# Patient Record
Sex: Male | Born: 1940
Health system: Southern US, Community
[De-identification: ages and names within clinical notes are randomized; demographics above are authoritative.]

## PROBLEM LIST (undated history)

## (undated) DIAGNOSIS — K2270 Barrett's esophagus without dysplasia: Secondary | ICD-10-CM

## (undated) DIAGNOSIS — Z87442 Personal history of urinary calculi: Secondary | ICD-10-CM

## (undated) DIAGNOSIS — J189 Pneumonia, unspecified organism: Secondary | ICD-10-CM

## (undated) DIAGNOSIS — E785 Hyperlipidemia, unspecified: Secondary | ICD-10-CM

## (undated) DIAGNOSIS — C801 Malignant (primary) neoplasm, unspecified: Secondary | ICD-10-CM

## (undated) DIAGNOSIS — M1990 Unspecified osteoarthritis, unspecified site: Secondary | ICD-10-CM

## (undated) DIAGNOSIS — I10 Essential (primary) hypertension: Secondary | ICD-10-CM

## (undated) DIAGNOSIS — I499 Cardiac arrhythmia, unspecified: Secondary | ICD-10-CM

## (undated) DIAGNOSIS — C61 Malignant neoplasm of prostate: Secondary | ICD-10-CM

## (undated) DIAGNOSIS — K219 Gastro-esophageal reflux disease without esophagitis: Secondary | ICD-10-CM

## (undated) HISTORY — DX: Hyperlipidemia, unspecified: E78.5

## (undated) HISTORY — PX: KNEE ARTHROSCOPY: SUR90

## (undated) HISTORY — PX: CYSTOSCOPY: SUR368

## (undated) HISTORY — PX: EYE SURGERY: SHX253

---

## 2002-12-03 DIAGNOSIS — C801 Malignant (primary) neoplasm, unspecified: Secondary | ICD-10-CM

## 2002-12-03 HISTORY — PX: PROSTATECTOMY: SHX69

## 2002-12-03 HISTORY — DX: Malignant (primary) neoplasm, unspecified: C80.1

## 2005-01-25 ENCOUNTER — Ambulatory Visit (HOSPITAL_BASED_OUTPATIENT_CLINIC_OR_DEPARTMENT_OTHER): Admission: RE | Admit: 2005-01-25 | Discharge: 2005-01-25 | Payer: Self-pay | Admitting: Urology

## 2005-01-25 ENCOUNTER — Ambulatory Visit (HOSPITAL_COMMUNITY): Admission: RE | Admit: 2005-01-25 | Discharge: 2005-01-25 | Payer: Self-pay | Admitting: Urology

## 2005-01-29 ENCOUNTER — Ambulatory Visit (HOSPITAL_COMMUNITY): Admission: RE | Admit: 2005-01-29 | Discharge: 2005-01-29 | Payer: Self-pay | Admitting: Urology

## 2005-02-12 ENCOUNTER — Inpatient Hospital Stay (HOSPITAL_COMMUNITY): Admission: EM | Admit: 2005-02-12 | Discharge: 2005-02-15 | Payer: Self-pay | Admitting: Emergency Medicine

## 2005-02-22 ENCOUNTER — Ambulatory Visit (HOSPITAL_COMMUNITY): Admission: RE | Admit: 2005-02-22 | Discharge: 2005-02-22 | Payer: Self-pay | Admitting: Urology

## 2005-02-22 ENCOUNTER — Ambulatory Visit (HOSPITAL_BASED_OUTPATIENT_CLINIC_OR_DEPARTMENT_OTHER): Admission: RE | Admit: 2005-02-22 | Discharge: 2005-02-22 | Payer: Self-pay | Admitting: Urology

## 2005-03-15 ENCOUNTER — Ambulatory Visit (HOSPITAL_COMMUNITY): Admission: RE | Admit: 2005-03-15 | Discharge: 2005-03-15 | Payer: Self-pay | Admitting: Urology

## 2005-04-16 ENCOUNTER — Ambulatory Visit (HOSPITAL_COMMUNITY): Admission: AD | Admit: 2005-04-16 | Discharge: 2005-04-16 | Payer: Self-pay | Admitting: Urology

## 2005-05-08 ENCOUNTER — Ambulatory Visit (HOSPITAL_COMMUNITY): Admission: RE | Admit: 2005-05-08 | Discharge: 2005-05-08 | Payer: Self-pay | Admitting: Urology

## 2005-05-08 ENCOUNTER — Ambulatory Visit (HOSPITAL_BASED_OUTPATIENT_CLINIC_OR_DEPARTMENT_OTHER): Admission: RE | Admit: 2005-05-08 | Discharge: 2005-05-08 | Payer: Self-pay | Admitting: Urology

## 2005-07-11 ENCOUNTER — Ambulatory Visit (HOSPITAL_COMMUNITY): Admission: RE | Admit: 2005-07-11 | Discharge: 2005-07-11 | Payer: Self-pay | Admitting: Urology

## 2006-11-23 ENCOUNTER — Inpatient Hospital Stay (HOSPITAL_COMMUNITY): Admission: EM | Admit: 2006-11-23 | Discharge: 2006-11-24 | Payer: Self-pay | Admitting: Emergency Medicine

## 2007-04-16 ENCOUNTER — Ambulatory Visit (HOSPITAL_COMMUNITY): Admission: RE | Admit: 2007-04-16 | Discharge: 2007-04-16 | Payer: Self-pay | Admitting: Urology

## 2007-11-12 ENCOUNTER — Encounter: Admission: RE | Admit: 2007-11-12 | Discharge: 2007-11-12 | Payer: Self-pay | Admitting: Internal Medicine

## 2009-06-13 ENCOUNTER — Ambulatory Visit (HOSPITAL_COMMUNITY): Admission: RE | Admit: 2009-06-13 | Discharge: 2009-06-13 | Payer: Self-pay | Admitting: Urology

## 2010-09-04 ENCOUNTER — Ambulatory Visit: Payer: Self-pay | Admitting: Cardiology

## 2011-04-20 NOTE — Discharge Summary (Signed)
[name], MCNATT NO.:  [id number]   MEDICAL RECORD NO.:  [id number]          PATIENT TYPE:  INP   LOCATION:  5511                         FACILITY:  [facility]   PHYSICIAN:  Aram Beecham B. Eliott Nine, M.D.DATE OF BIRTH:  09-25-1941   DATE OF ADMISSION:  02/12/2005  DATE OF DISCHARGE:  02/15/2005                                 DISCHARGE SUMMARY   ADMISSION DIAGNOSES:  1.  Acute renal failure with hyperkalemia.  2.  History of obstructing right ureteral stone status post lithotripsy and      laser tripsy with poorly functioning right kidney and a kidney stone in      the left renal pelvis in the lower pole.  3.  History of prostatic cancer, status post prostatectomy and radiation      therapy in 2003.  4.  Hypertension.   DISCHARGE DIAGNOSES:  1.  Acute renal failure secondary to obstructive uropathy, resolving status      post percutaneous nephrostomy on the left.  2.  Hypertension.  3.  Kidney stones with obstructing right ureteral stone, left stones in the      renal pelvis and lower pole.  4.  History of prostatic carcinoma status post prostatectomy and radiation      therapy in 2003.  5.  History of urinary incontinence.   PROCEDURES:  1.  Renal ultrasound on February 12, 2005 showing bilateral renal cyst with      double-J catheters in place, element of hydronephrosis bilaterally.  2.  On February 12, 2005, successful left percutaneous nephrostomy catheter      insertion by Dr. Jolaine Click of radiology.   BRIEF HISTORY/REASON FOR ADMISSION:  [name] is a 70 year old white male  with a history of prostatic cancer status post prostatectomy and radiation  therapy in 2003 with also a known history of stone disease evaluated on  January 12, 2005 by Dr. Bjorn Pippin because of erectile dysfunction and  incontinence.  As part of his evaluation, he had a CT scan of his abdomen  and pelvis showing obstructive distal right ureteral stone and stones in the  left renal  pelvis and lower pole.  There was by report very little  functioning and a severely hydronephrotic right kidney.  Creatinine at that  time was 1.8.  On January 25, 2005, he underwent lithotripsy on the left,  and right distal ureteroscopy with laser tripsy and double-J stents were  placed.  He was seen in followup on February 05, 2005.  A KUB at that time  showed the majority of the stone material in the left distal ureter  alongside the stent with three clusters of fragments in the lower pole.   The patient began to feel poorly about one week ago to 10 days ago.  He  developed excessive sleepiness and intermittent bouts of nausea.  This was  associated with vomiting and anorexia and came to Dr. Belva Crome office for  evaluation.  His labs were done on 02/12/05, showing a BUN of 150, creatinine  of 21.5, and a potassium of 8.5.  He was  sent to the emergency room for  further evaluation and [facility] was consulted.  The patient says he  has not been oliguric or having pain.  He has had decreased urine volume,  however.  He does report fatigue, nausea, anorexia, and poor p.o. intake.  His medications were limited to Diovan which he did not know the dose,  briefly switched from lisinopril to Diovan by Dr. Georgann Housekeeper who is his  primary care physician.  There is nobody in his family with a history of  renal disease or stone disease.   PHYSICAL EXAMINATION:  GENERAL:  On physical exam by Dr. Eliott Nine in the  emergency room, general appearance is a pleasant, white male in no acute  distress.  He was accompanied in the ER by his wife.  VITAL SIGNS:  Noted blood pressure 170/82, heart rate 86 and regular,  respirations 18, O2 saturation 97% on room air.  NECK:  There is no JVD, distention in spine position, no carotid bruits are  auscultated.  LUNGS:  Clear to auscultation.  CARDIAC EXAM:  S4, S1, S2, no audible S3.  ABDOMINAL EXAM:  Revealed bowel sounds positive, no abdominal bruits   appreciated.  He had some left-sided abdominal tenderness to deep palpation.  The bladder was not palpable or percussible.  He had a healed prostatectomy  scar noted.  EXTREMITIES:  Revealed no edema and no asterixis.   LABORATORY DATA:  Laboratory data from Dr. Belva Crome office showed a glucose  of 189, CO2 13, calcium 9.1.  His other pertinent laboratories showed CBC  with a white count of 8.9, hemoglobin 12.4, platelets 568,000.  Phosphorus  here was 5.1, with an albumin of 3.4.   The patient was admitted by Dr. Eliott Nine with renal ultrasound obtained.  EKG  was done.  With his hyperkalemia, the patient was given sodium bicarbonate  x2 amps, calcium gluconate x1, 5 units of regular insulin, and D50, and 50 g  of Kayexalate.  The patient had a baseline creatinine of 1.7.  Now that he  was obstructed, it was suspected that his good left kidney was obstructed  with a history of poor right-sided function at baseline.  Repeat labs were  ordered to make sure his potassium stabilized before emergent ultrasound and  percutaneous nephrostomy on the left could be done if in fact he was  obstructed.  If his potassium did not respond, he would require acute  hemodialysis.  Blood pressure medicine was held and p.r.n. clonidine was to  be used.   HOSPITAL COURSE:  The patient's hyperkalemia did improve with this medical  management.  Potassium rechecked in the ER was 7.4 and went down to 5.1, and  he was moved over to the radiology department.  At that point, an ultrasound  was done showing bilateral renal cysts with double-J catheters in place and  an element of hydronephrosis bilaterally.  Because of known poor function on  his right and the hydronephrosis on the left, the left kidney was thought to  be a source of his acute renal failure with obstruction.  Dr. Jolaine Click  performed successful left percutaneous nephrostomy catheter placement with a 10 [place] on February 12, 2005.  The patient had  excellent urine output after  catheter was placed with the patient's BUN and creatinine improving after  catheter placement to a BUN of 127 and creatinine down to 15.9 from his 27.0  creatinine in the ER.  Potassium improved to 5.4.  Labs were checked  daily.  He symptomatically improved with the nausea and vomiting resolving.  BUN was  down to 57, creatinine 5.1 on February 14, 2005.  IV fluids which were started  on admission were stopped.  He took p.o. fluids without difficulty.  His  creatinine was down to 3.2 on February 15, 2005.  He was up ambulating in the  halls without difficulty.  He was deemed ready for discharge home by Dr.  Eliott Nine with further followup with Dr. Annabell Howells.   It should be noted that Dr. Annabell Howells followed the patient also in the hospital.  He planned to follow up the patient in the office on February 21, 2005 and at  that time would have a KUB and a ureteroscopy on February 22, 2005.   The patient's blood pressure was managed in the hospital with Norvasc 10 mg  p.o. daily, blood pressure 169/93  at the time of discharge.  We will have  further followup as an outpatient.  He was on Norvasc 10 mg daily.  At this  time, no ACE's or ARB should be given to this patient who had acute renal  failure from obstruction and now kidney function status post percutaneous  catheter.   The patient was discharged on February 15, 2005 with followup with Dr. Annabell Howells on  February 21, 2005.  Follow up with Dr. Camille Bal in her office in three to  four weeks.  He will have a renal profile drawn in Dr. Belva Crome office on  February 21, 2005 with results faxed to Dr. Elza Rafter office.  This is  instructed to Dr. Belva Crome office today.  Also, the patient will follow up  with Dr. Donette Larry, his primary doctor as an outpatient.   MEDICATIONS ON DISCHARGE:  Norvasc 10 mg daily.   DIET:  No added sodium.   ACTIVITY:  As tolerated.   The patient was given instructions on how to manage his left nephrostomy   catheter.  He will record volumes of urine daily and bring the log to Dr.  Elza Rafter and Dr. Belva Crome office.      DWZ/MEDQ  D:  02/15/2005  T:  02/15/2005  Job:  [id number]   cc:   Excell Seltzer. Annabell Howells, M.D.  [address]  [place]  [place] [address]  Fax: [phone]   Georgann Housekeeper, MD  [address]  [place]  [place] [address]  Fax: [phone]

## 2011-04-20 NOTE — Op Note (Signed)
NAMEJARIEL, Matthew [name] NO.:  [id number]   MEDICAL RECORD NO.:  [id number]          PATIENT TYPE:  AMB   LOCATION:  DAY                          FACILITY:  [facility]   PHYSICIAN:  Excell Seltzer. Annabell Howells, M.D.    DATE OF BIRTH:  Jan 21, 1941   DATE OF PROCEDURE:  04/16/2005  DATE OF DISCHARGE:  04/16/2005                                 OPERATIVE REPORT   PROCEDURE:  Cystoscopy, bilateral retrograde pyelography, bilateral  ureteroscopy, and insertion of bilateral double-J stents.   PREOPERATIVE DIAGNOSIS:  History of bilateral nephrolithiasis with ureteral  obstruction and renal insufficiency.   POSTOPERATIVE DIAGNOSIS:  History of bilateral nephrolithiasis with ureteral  obstruction and renal insufficiency.   SURGEON:  Dr. Bjorn Pippin.   ANESTHESIA:  General.   DRAINS:  Bilateral 8 French x 26 cm double-J stents.   COMPLICATIONS:  None.   INDICATIONS:  Matthew [name] is a 70 year old white male with a history of  bilateral nephrolithiasis who has previously been treated with multiple  procedures.  He first underwent a right ureteroscopic stone extraction for a  large obstructing stone.  He then underwent placement of bilateral stents,  and this was followed by lithotripsy on the left for multiple left renal  calculi.  He developed a left steinstrasse with obstructing stent and renal  insufficiency with a creatinine of 25.  He underwent a percutaneous  nephrostomy, then had ureteroscopy with removal of the steinstrasse followed  by stent replacement.  His creatinine came back down to 1.7.  He then  underwent a second lithotripsy for residual fragments in the kidney and  eventually had his stents removed.  He did well but return on the 15th with  general malaise and feeling poorly.  He denied flank pain.  A repeat  creatinine was elevated at 6.8 with a potassium of 6.  Ultrasonography  revealed severe hydro on the right and mild-to-moderate hydro on the left.  It was felt that  urgent stenting was indicated.   FINDINGS/PROCEDURE:  Patient was taken to the operating room where general  anesthetic was induced.  He had been given an antibiotic preoperatively.  He  was placed in a lithotomy position.  His perineum and genitalia were prepped  with Betadine solution.  He was draped in the usual sterile fashion.  Cystoscopy was performed in the usual fashion with a 22 [place] scope and the  12 degree lens.  Examination revealed a normal urethra.  The external  sphincter was intact.  The prostate was absent from prior radical  prostatectomy.  Examination of the bladder revealed some inflammatory  changes in the base of the bladder from his prior procedures.  The left  ureteral orifice was cannulated with a 5 [place] open-ended catheter, and  contrast was instilled in a retrograde fashion.  This revealed dilation of  the distal ureter with about a 3 cm area of narrowing, followed by dilation  of the more mid-to-proximal ureter.  There was a questionable filling defect  in the area of the UPJ, suggestive for residual stone fragments.   A right  retrograde pyelogram was then performed in a similar fashion.  This  revealed marked hydronephrosis proximal to the distal ureter.  Unfortunately, due to patient positioning, I was unable to see the very most  distal ureters on either side.   After completion of the retrograde pyelogram, I was able to negotiate a  guidewire up to the left kidney; however, it required use of a 6 French  short ureteroscope to bypass the distal ureter.  Once the wire was up to the  kidney, an 8 French x 26 cm double-J stent without string was inserted under  fluoroscopic guidance.  Upon removal of the wire, a good coil was noted in  the kidney and in the bladder.   I then turned my attention to the right ureter.  An attempt to pass the wire  was unsuccessful due to angulation and narrowing.  The 6 French short  ureteroscope was then once again used to  cannulate the orifice.  Some stone  fragments were identified in the distal ureter, but the lumen was eventually  located, and a guidewire was passed through the kidney.  The cystoscope was  reinserted over the wire, and an 8 French x 26 cm double-J stent was then  inserted into the kidney under fluoroscopic guidance.  The wire was removed,  leaving a good coil in the kidney and a good coil in the bladder.  At this  point, the patient's bladder was drained.  He was taken down from the  lithotomy position.  His anesthetic was reversed.  He was removed to the  recovery room in stable condition.  There were no complications during the  procedure.  We will leave these stents in for approximately two weeks and  then repeat ureteroscopy.      JJW/MEDQ  D:  04/20/2005  T:  04/20/2005  Job:  [id number]

## 2011-04-20 NOTE — Op Note (Signed)
NAMEGEO, SLONE NO.:  [id number]   MEDICAL RECORD NO.:  [id number]          PATIENT TYPE:  AMB   LOCATION:  NESC                         FACILITY:  [facility]   PHYSICIAN:  Excell Seltzer. Annabell Howells, M.D.    DATE OF BIRTH:  Apr 14, 1941   DATE OF PROCEDURE:  02/22/2005  DATE OF DISCHARGE:                                 OPERATIVE REPORT   PROCEDURE:  1.  Cystoscopy, removal of right double J stent.  2.  Removal of left double J stent with left ureteroscopic stone extraction      with holmium lasertripsy.  3.  Placement of left double J stent.   PREOPERATIVE DIAGNOSES:  Retained bilateral ureteral stents and left  ureteral Steinstrasse with obstructed ureteral stent.   POSTOPERATIVE DIAGNOSES:  Retained bilateral ureteral stents and left  ureteral Steinstrasse with obstructed ureteral stent.   SURGEON:  Cassius Cullinane. Annabell Howells, M.D.   ANESTHESIA:  General.   SPECIMENS:  Stone fragments.   DRAINS:  8 French 26 cm left double J stent.   COMPLICATIONS:  None.   INDICATIONS FOR PROCEDURE:  Matthew [name] is a 70 year old white male who was  found to have an obstructing right distal ureteral stone with atrophy of the  kidney and three stones in the left kidney. He originally underwent right  ureteroscopic stone extraction with placement of a bilateral double J stents  followed by lithotripsy. He developed obstruction of the left ureteral stent  and a distal ureteral Steinstrasse with acute renal failure and was found to  have minimal function of his right kidney from chronic obstruction. He  underwent percutaneous nephrostomy tube placement with near normalization of  his creatinine and BUN. He returns today for removal of the right stent,  removal of the left stent with ureteroscopic removal of the Steinstrasse and  replacement of left stent.   DESCRIPTION OF PROCEDURE:  The patient was given Cipro. He was taken to the  operating room where a general anesthetic was induced. He was  placed in  lithotomy position, his perineum and genitalia were prepped with Betadine  solution, he was draped in the usual sterile fashion. Cystoscopy was  performed using the 22 [place] scope and the 12 degree lens. The right  ureteral stent was visualized and grasped with alligator forceps and  removed. The left ureteral stent was then grasped with alligator forceps and  removed to the urethral meatus. An attempt was made to pass a wire through  the stent __________ and this was not possible. I then removed the stent and  placed the cystoscope in the bladder. An attempt was made to advance the  wire up the ureter.  Initially it was unsuccessful but eventually I was able  to negotiate the wire by the obstructing Steinstrasse to the kidney. At this  point, a 4 cm, 15 French balloon dilation catheter was passed over the wire  and the distal ureter was dilated. The 6 French short ureteroscope was then  passed along side the wire and the stones in the distal ureter were engaged  with the holmium  laser with the 0.375 fiber initially on 5 watts but  eventually on 8 watts. The stone fragments were gradually loosened and  removed in intervals with a nitinol stone basket. The Steinstrasse was quite  extensive extending probably 4 cm up the ureter. I was able to remove all  the large fragments that were present. There was some dust remaining. After  removal of all the large fragments, the dust would not engage in the basket.  The procedure was quite prolonged and arduous due to the large number of  fragments and there was some ureteral irritation but no rents in the  ureteral wall after completion. Once all the removable stone material was  placed in the bladder. The cystoscope was reinserted and the bladder was  evacuated with a To[place]y syringe. The cystoscope was then inserted back over  the wire and an 8 [place] 26 cm double J stent was inserted into the kidney  under fluoroscopic guidance. The left  percutaneous nephrostomy tube was then  removed without difficulty. The stent did appear to extend out into the  nephrostomy track so it was pulled back until it was located in the renal  pelvis. At this point, final inspection revealed the stent to be well  located in the bladder. The patient was taken down from the lithotomy  position, his anesthetic was reversed, he was removed to the recovery room  in stable condition. There were no complications.      JJW/MEDQ  D:  02/22/2005  T:  02/22/2005  Job:  [id number]   cc:   Duke Salvia. Eliott Nine, M.D.  [address]  [place]  [place] [address]  Fax: [phone]

## 2011-04-20 NOTE — H&P (Signed)
NAMEJAEVION, GOTO NO.:  [id number]   MEDICAL RECORD NO.:  [id number]          PATIENT TYPE:  EMS   LOCATION:  MAJO                         FACILITY:  [facility]   PHYSICIAN:  Aram Beecham B. Eliott Nine, M.D.DATE OF BIRTH:  November 13, 1941   DATE OF ADMISSION:  02/12/2005  DATE OF DISCHARGE:                                HISTORY & PHYSICAL   HISTORY OF PRESENT ILLNESS:  This is a 70 year old white male with a history  of prostate cancer, status post prostatectomy and adjuvant radiation therapy  in 2003.  He also has a history of stone disease.  He was evaluated on  January 12, 2005, by Dr. Excell Seltzer. Annabell Howells because of erectile dysfunction and  incontinence.  As part of his evaluation he had a CT of the abdomen and  pelvis which showed an obstructing distal right ureteral stone, and stones  in the left renal pelvis and lower pole.  There was by report very little  functioning in the severely hydronephrotic right kidney.  Creatinine at that  time was 1.8.  On January 25, 2005, he underwent a lithotripsy on the left,  and a right distal ureteroscopy with laser tripsy and double-J stents were  placed.  He was seen in followup on February 05, 2005.  A KUB at that time  showed the majority of the stone material in the left distal ureter, along  side the stent with three clusters of fragments in the lower pole.   The patient began to feel poorly about one week to 10 days ago.  He  developed excessive sleepiness, lost his appetite, and had intermittent  episodes of nausea, associated with vomiting and anorexia.  He came to Dr.  Belva Crome office today where labs were done, demonstrating a BUN of 150,  creatinine 21.5 and a potassium of 8.5.  He was sent to the emergency room  for further evaluation.  He has not been oliguric or having pain, but has  had decreased urine volume.  He also reports fatigue, anorexia, nausea and  poor p.o. intake.   PAST MEDICAL HISTORY:  1.  Prostate cancer.  2.   Uric acid stones as above.  3.  Erectile dysfunction.  4.  History of urinary incontinence.  5.  Hypertension.   CURRENT MEDICATIONS:  Limited to Diovan, question dose (previously switched  from Lisinopril to Diovan by Dr. Georgann Housekeeper, his primary care physician.)   FAMILY HISTORY:  Noncontributory, with no history of renal or stone disease.   SOCIAL HISTORY:  The patient works with [employer].  He  is married and has five children.  He recently relocated here from [place].  He does not use tobacco, and only occasionally uses alcohol.   REVIEW OF SYSTEMS:  Positive for fatigue, excessive sleepiness for one week,  nausea, with intermittent vomiting, anorexia, decreased urine volume and  blood-tinged urine.   PHYSICAL EXAMINATION:  GENERAL:  He is a pleasant white male, in no acute  distress.  He is accompanied in the emergency room by his wife.  VITAL SIGNS:  Blood  pressure 170/82, heart rate 86 and regular, respirations  18, oxygen saturation 97% on room air.  HEENT:  Normocephalic without evidence for trauma.  NECK:  There is no jugular venous distention in the supine position.  He has  no carotid bruits.  LUNGS:  Clear to auscultation.  HEART:  S4, S1, S2.  No audible S3.  ABDOMEN:  Reveals a well-healed prostatectomy scar.  He has some mild left-  sided abdominal tenderness to deep palpation. The bladder is not palpable or  percussible.  There are no abdominal bruits.  There is no edema of the lower  extremities.  He has no asterixis.   LABORATORY DATA:  From Dr. Belva Crome office, glucose 189, BUN 50, creatinine  21.5, potassium 8.5.  CO2 of 13, calcium 9.1.  CBC and uric acid pending.   An ultrasound is pending.   An electrocardiogram is pending.   IMPRESSION:  A 70 year old white male with stone disease, who has a baseline  creatinine of 1.7, who is status post lithotripsy and laser tripsy of an  obstructing right ureteral stone, and a  poorly-functioning right kidney, and  a stone in the left renal pelvis and lower pole.  He now presents with acute  renal failure and hyperkalemia.  I suspect his good left kidney is  obstructed, with a poor history of right-sided function at baseline.   PLAN:  Will repeat stat labs, give him sodium bicarbonate x2 amp, calcium  gluconate x1 amp, 5 units of Regular insulin and D-50 and 50 g of  Kayexalate.  Hopefully then we can stabilize his potassium and proceed with  emergent ultrasound and percutaneous nephrostomy on the left, if he is in  fact obstructed.  If his potassium does not respond to the above  conservative treatment, will require acute dialysis.  Will hold his Diovan  and use p.r.n. clonidine for hypertension.      CBD/MEDQ  D:  02/12/2005  T:  02/12/2005  Job:  [id number]   cc:   Excell Seltzer. Annabell Howells, M.D.  [address]  [place]  [place] [address]  Fax: [phone]   Georgann Housekeeper, MD  [address]  [place]  [place] [address]  Fax: [phone]   [facility]

## 2011-04-20 NOTE — Op Note (Signed)
NAMETERRION, GENCARELLI                  ACCOUNT NO.:  [id number]   MEDICAL RECORD NO.:  [id number]          PATIENT TYPE:  AMB   LOCATION:  DSC                          FACILITY:  [facility]   PHYSICIAN:  Excell Seltzer. Annabell Howells, M.D.    DATE OF BIRTH:  06-Nov-1941   DATE OF PROCEDURE:  01/25/2005  DATE OF DISCHARGE:                                 OPERATIVE REPORT   HISTORY:  Mr. Samford is a 70 year old white male who originally presented to  my office for followup of prostate cancer. He had a radical prostatectomy  with adjuvant radiation therapy in 2002. During his initial evaluation, he  was found to have microhematuria. A CT scan demonstrated an obstructing  large right distal ureteral stone and two large left renal pelvic stones. It  was felt that management of the right ureteral stone was indicated as  initial therapy, so the patient comes in today for ureteroscopy and laser-  tripsy.   FINDINGS AND PROCEDURE:  The patient was taken to the operating room after  receiving p.o. Cipro. He was given a general anesthetic, a time-out was  performed, the site had been marked previously. He was placed in the  lithotomy position. His perineum and genitalia were prepped with Betadine  solution, he was draped in the usual sterile fashion. Cystoscopy was  performed using the 22-French scope and the 12- and 70-degree lens.  Examination revealed a normal urethra, the external sphincter was intact.  The bladder neck did have mild contracture, but the scope could be passed.  The right ureteral orifice was visualized on a ridge of trigonal tissue,  somewhat posteriorly oriented. The left ureteral orifice was more normal and  a more normal orientation. There was a small tissue-band at the left bladder  neck, but no other mucosal abnormalities were noted. The right ureteral  orifice was eventually cannulated with a guidewire using an Albarran bridge  and a 70-degree scope after initial attempts with a 12-degree and  an open-  end catheter were unsuccessful. The open-end catheter was then inserted over  the wire, the wire was removed, and contrast was instilled in a retrograde  fashion. This outlined a filling defect in the distal ureter consistent with  a 14 x 9 mm stone seen on CT scan. At this point, a Glidewire was then used  to negotiate by the impacted stone in the distal ureter. The open-end  catheter was then advanced over the Glidewire. The Glidewire was removed and  was replaced with a standard Teflon-coated guidewire. The open-end catheter  was then removed and a 4 cm, 15-French balloon dilation catheter was passed  over the wire. The distal ureter was then dilated to 15-French with the  balloon dilation catheter and the cystoscope and balloon dilation catheter  were removed leaving the wire in place. The 6-French short ureteroscope was  then advanced along side the wire up to the level of the stone, which was  easily visualized. The stone was then completely fragmented with the holmium  laser device. Once fragmentation had been completed, the fragments were  removed  with a Nitinol basket; I did have to go back and re-fragment some  larger fragments that did not want to come out with the basket. However, I  was eventually able to fragment the entire stone sufficiently and all but  some very small fragments were removed from the ureter. The final inspection  with the ureteroscope revealed a few 1-2 mm fragments that were too small to  engage in the basket; no significant mucosal tears, but some mucosal  irritation. The ureteroscope was then removed and the cystoscope was then  reinserted over the guidewire. A 6-French, 26 cm double-J stent without  string was then advanced to the kidney under fluoroscopic guidance. The wire  was removed leaving a good coil in the kidney, a good coil in the bladder.  The ureteral stone fragments were then evacuated from the bladder using a  Toomey syringe and  the cystoscope. Once the fragments had been evacuated, I  elected to perform a left retrograde pyelogram to better assess the stones  in the left renal pelvis. This study revealed a normal ureter, the renal  pelvis was non-dilated but there were two large filling defects within the  renal pelvis. Each stone appeared to measure approximately 15 x 8 mm. With  this confirmation of two large stones in the renal pelvis, I elected to go  ahead and place a left double-J stent in preparation for eventual  lithotripsy. A guidewire was passed to the kidney without difficulty. A 6-  French, 24 cm stent was then passed over the guidewire to the kidney under  fluoroscopic guidance. The wire was removed leaving  a good coil in the kidney and a good coil in the bladder. At this point, the  bladder was drained, the patient was taken down from the lithotomy position,  his anesthetic was reversed, and he was moved to the recovery room in stable  condition. There were no complications.      JJW/MEDQ  D:  01/25/2005  T:  01/25/2005  Job:  [id number]

## 2011-04-20 NOTE — Op Note (Signed)
Matthew [name], Matthew [name]                  ACCOUNT NO.:  [id number]   MEDICAL RECORD NO.:  [id number]          PATIENT TYPE:  INP   LOCATION:  1444                         FACILITY:  [facility]   PHYSICIAN:  Sigmund I. Patsi Sears, M.D.DATE OF BIRTH:  Jan 19, 1941   DATE OF PROCEDURE:  11/24/2006  DATE OF DISCHARGE:                               OPERATIVE REPORT   PREOPERATIVE DIAGNOSIS:  Bilateral ureteral calculi with left  hydronephrosis.   POSTOPERATIVE DIAGNOSIS:  Bilateral ureteral calculi with left  hydronephrosis.   OPERATION:  Cystourethroscopy, bilateral retrograde pyelogram with  interpretation, bilateral ureteroscopy, basket extraction of right lower  ureteral calculus, laser fractionation of left ureteral stone with  basket extraction of fragments, and 6-French x 26 cm left double-J  catheter.   SURGEON:  Sigmund I. Patsi Sears, M.D.   ANESTHESIA:  General LMA.   PREPARATION:  After appropriate preanesthesia, the patient is brought to  the operating room, placed on the operating table in dorsal supine  position where general LMA anesthesia was induced.  He was then replaced  in dorsal lithotomy position where pubis was prepped with Betadine  solution and draped in usual fashion.   REVIEW OF HISTORY:  [name] is a 70 year old male who was  admitted from the emergency room yesterday with bilateral flank pain,  left greater than right, with evaluation showing bilateral ureteral  calculi, with a 2 mm right ureterovesical junction calculus, and a 6 mm  left distal ureteral stone, with hydronephrosis.  The patient has a  history of acute renal failure with kidney stones.  He is admitted to  the hospital on Saturday 11/23/2006, for surgery on 11/24/2006 for  basket extraction of stones.   PROCEDURE:  With the patient in the dorsal lithotomy position,  cystourethroscopy accomplished.  X-ray does not define the stones, and  therefore bilateral retrograde pyelograms performed.   However,  retrograde pyelogram did not show calculus on either side, although the  left side did appeared to show hydronephrosis, and narrowing of the  distal ureter.   Right ureteroscopy accomplished, and showed a small stone the distal  right ureter, which was basket extracted.  Because of the ease of basket  extraction, it was decided to not place double-J on the right side.   On the left side, the left distal ureter with grossly edematous, and  very tight.  It was very difficult to manipulate the 6-French short  ureteroscope into the ureter, and advanced it.  The guidewire was placed  to the level of the kidney but still no stone could be identified.   With constant pressure, I was able to manipulate the ureteroscope around  severely edematous fibrotic area of the lower ureter and found  rectangular stone complete with spikes in the lower ureter.  I elected  to break the stone with a laser fiber, because I did not feel it would  pass through the circumferential ring of edema in the distal ureter.  Following fractionation of the stone, the elements were basket  extracted, it was elected to leave a double-J catheter.  I placed a  6-  French x 26 cm double-J catheter in the renal pelvis and the bladder  without difficulty.  The patient is given 30 mg of IV Toradol, awakened,  taken to recovery room in good condition.      Sigmund I. Patsi Sears, M.D.  Electronically Signed     SIT/MEDQ  D:  11/24/2006  T:  11/25/2006  Job:  [id number]

## 2011-04-20 NOTE — Op Note (Signed)
Matthew [name], Matthew [name] NO.:  [id number]   MEDICAL RECORD NO.:  [id number]          PATIENT TYPE:  AMB   LOCATION:  NESC                         FACILITY:  [facility]   PHYSICIAN:  Excell Seltzer. Annabell Howells, M.D.    DATE OF BIRTH:  07-Jan-1941   DATE OF PROCEDURE:  05/08/2005  DATE OF DISCHARGE:                                 OPERATIVE REPORT   PROCEDURE:  Cystoscopy, bilateral retrograde pyelograms with interpretation,  bilateral ureteroscopic stone extraction and bilateral removal and  replacement of double J stents.   PREOPERATIVE DIAGNOSIS:  History of bilateral ureteral stones with  obstruction with residual fragments.   POSTOPERATIVE DIAGNOSIS:  History of bilateral ureteral stones with  obstruction with residual fragments with possible bilateral ureteral  strictures.   SURGEON:  Gevon Markus. Annabell Howells, M.D.   ANESTHESIA:  General anesthesia.   SPECIMENS:  Stone fragments.   DRAINS:  Bilateral 8.2 x 26 French double J stents.   COMPLICATIONS:  None.   INDICATIONS FOR PROCEDURE:  Matthew [name] is a 70 year old white male who  originally presented to the practice for follow-up of prostate cancer.  He  had a radical prostatectomy for PSA of 28 with a Gleason's 8 adenocarcinoma  of the prostate.  He underwent adjuvant radiation therapy following the  procedure.  He had had some hematuria and was in the process of being  evaluated when he moved to this area. On my follow-up evaluation, he was  found to have a large right distal ureteral stone with significant  obstruction and renal atrophy.  He also had left renal pelvic stones.  He  has undergone bilateral stenting with right ureteroscopy and right  lithotripsy initially.  He developed stone obstruction on the left secondary  to a Steinstrasse and had to undergo left ureteroscopic stone extraction to  break up the Steinstrasse.  His stents were removed after this but he  developed recurrent renal insufficiency due to bilateral  recurrent  obstruction and more recently underwent replacement of bilateral ureteral  stents.  He returns now for reinspection of the ureters.   FINDINGS:  The patient was taken to the operating room after receiving  preoperative antibiotics with Ancef. He was placed on the table in the  supine position.  A general anesthetic was induced.  He was then  repositioned to lithotomy.  A B&O suppository was placed.  He was prepped  with Betadine solution and draped in the usual sterile fashion.  Cystoscopy  was performed using the 22 [place] scope and the 12 degree lens.  Examination  revealed a normal urethra.  The external sphincter was intact.  The prostate  was absent but there was no evidence of bladder neck contracture from his  prostatectomy.  There was significant stent irritation in the trigone.  The  right ureteral stent was grasped with a grasping forceps and pulled back to  the urethral meatus.  A wire was passed through the stent up to the kidney  and the stent was removed.  At this point, the 6 French short ureteroscope  was passed along side  the wire.  Several stone fragments were noted in the  distal ureter.  These were removed with the Nitinol basket.  I then advanced  the ureteroscope to the level of the iliac vessels but could not advance it  any further.  No additional stone fragments were noted.  At this point, the  ureteroscope was removed and a 5 [place] open end catheter was placed over  the wire.  The wire was removed.   Retrograde pyelogram was performed on the right with installation of  contrast.  This revealed a markedly dilated right collecting system with an  unremarkable ureter down to the level of the vessels where there was a  fairly tight area suggestive of a stricture.  There was about a 3 to 4 cm  stretch of somewhat ratty narrowed ureter in the area of the mid ureter that  once again was suggestive for stricture.  The more distal ureter was more  widely  dilated.  At this point, the open end catheter was removed.   The cystoscope was reinserted.  The left stent was grasped with grasping  forceps and moved the urethral meatus.  An attempt was made to place a wire  through the stent but this was unsuccessful due to some degree of  obstruction.  The stent was then removed and cystoscopy was performed.  The  stent was then removed.  At this point, I elected to proceed with retrograde  pyelography.   A left retrograde pyelogram was performed with a 5 [place] open end catheter.  This revealed some narrowing and irregularity of the mid and distal ureter  suggestive of mild stricturing.  The more proximal ureter was unremarkable.  No obvious filling defects were noted along the course of the ureter.  The  intrarenal collecting system had moderate caliectasis but was not nearly as  severe as on the right.   At this point, a guidewire was passed to the kidney.  The open end catheter  and cystoscope was removed and the ureteroscope was the passed along side  the wire.  A stone fragment was noted in the mid ureter embedded in the  mucosa.  This was teased out with the Nitinol basket and removed without  difficulty. No other stone fragments were identified along the course of the  left ureter.   At this point, the cystoscope was reinserted over the wire. The bladder was  evacuated free of the stone chips removed from the ureters and an 8.2  French x 26 cm double J stent was then passed over the wire on the left to  the kidney under fluoroscopic guidance.  The wire was removed leaving good  coil in the kidney, a good coil in the bladder.   Because of the apparent stricture of the right mid ureter, I felt stenting  of this area was indicated as well and a guidewire was then passed up to the  right kidney without difficulty.  A second 8.2 French x 26 cm double J stent was then inserted over the wire to the kidney under fluoroscopic guidance.  Upon  removal of the wire, a good coil was noted in the kidney and in the  bladder.   At this point, the bladder was drained.  The cystoscope was removed.  The  patient was taken down from lithotomy position.  His anesthetic was  reversed.  He was admitted to recovery in stable condition.  There were no  complications.  JJW/MEDQ  D:  05/08/2005  T:  05/08/2005  Job:  [id number]   cc:   Duke Salvia. Eliott Nine, M.D.  [address]  [place]  [place] [address]  Fax: [phone]   Georgann Housekeeper, MD  [address]  [place]  [place] [address]  Fax: [phone]

## 2011-04-20 NOTE — H&P (Signed)
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  No radiation, no gross hematuria, no fever, no chills.  The  patient has recently had a history of kidney stone, and also had acute  renal failure secondary to obstruction, with secondary  hyperparathyroidism, treated by Dr. Camille [name].   The patient's left flank pain has continued, and he is seen in [facility] Emergency Room for evaluation.  No nausea, no vomiting, no gross  hematuria.   CT scan in [facility] Room shows bilateral ureteral calculi,  with a 2 mm right distal UV junction stone, and a 6 mm distal left  ureteral calculi, with bilateral hydronephrosis.  In addition, there is  a 3 cm hyperdense cyst versus neoplasm noted in the right kidney.  The  patient notes that he is evaluated per Dr. Annabell [name] periodically for the  renal cyst.   PAST MEDICAL HISTORY:  1. Significant for acute renal failure.  2. Secondary hyperparathyroidism treated by Dr. Eliott [name].  3. Hypertension.   FAMILY HISTORY:  Noncontributory.   SOCIAL HISTORY:  Noncontributory.  Tobacco:  None.  Alcohol:  None.   REVIEW OF SYSTEMS:  flank pain. Otherwise non-contributory.   ALLERGIES:  None known.   MEDICATIONS:  1. Caduet 10/20 mg one per day.  2. Metoprolol 100 mg one p.o. per day.  3. Calcitriol 0.25 mcg per day.  4. Allopurinol 100 mg per day.  5. Lasix (unknown dose).   PHYSICAL EXAMINATION:  GENERAL:  Shows a thin, well-developed, white  male in no acute distress.  VITAL SIGNS:   The vital signs are pending.  NECK:  Supple, nontender.  CHEST:  Clear to P&A.  ABDOMEN:  Soft.  Decreased bowel sounds.  There is left flank pain to  __________ percussion.  There is no left lower quadrant pain now.  __________  Normal glands, normal testicles.  EXTREMITIES:  No cyanosis or edema.  SKIN:  Normal to inspection and palpation.  PSYCH/NEUROLOGIC:  Shows normal orientation to time, person, and place.   IMPRESSION:  CT scan as noted above.   PLAN:  We have given the patient the  options of trying to pass stone at  home, but because of bilaterality of stones, and obstruction, and  history of acute renal failure, we will plan cysto bilateral retrograde  pyelogram and extraction of stones in a.m.      Matthew [name], M.D.  Electronically Signed     SIT/MEDQ  D:  11/23/2006  T:  11/25/2006  Job:  [id number]   cc:   Excell Seltzer. Matthew [name], M.D.  Fax:  [phone]   Duke Salvia Matthew [name], M.D.  Fax: [phone]

## 2011-08-03 ENCOUNTER — Ambulatory Visit (HOSPITAL_COMMUNITY)
Admission: RE | Admit: 2011-08-03 | Discharge: 2011-08-03 | Disposition: A | Discharge status: Home / Self Care | Payer: BC Managed Care – PPO | Source: Ambulatory Visit | Attending: Internal Medicine | Admitting: Internal Medicine

## 2011-08-03 DIAGNOSIS — M7989 Other specified soft tissue disorders: Secondary | ICD-10-CM

## 2011-08-03 DIAGNOSIS — M79609 Pain in unspecified limb: Secondary | ICD-10-CM | POA: Insufficient documentation

## 2012-06-26 ENCOUNTER — Other Ambulatory Visit: Payer: Self-pay | Admitting: Urology

## 2012-06-26 DIAGNOSIS — C61 Malignant neoplasm of prostate: Secondary | ICD-10-CM

## 2012-07-09 ENCOUNTER — Ambulatory Visit (HOSPITAL_COMMUNITY): Payer: BC Managed Care – PPO

## 2012-07-09 ENCOUNTER — Encounter (HOSPITAL_COMMUNITY): Payer: BC Managed Care – PPO

## 2012-07-14 ENCOUNTER — Ambulatory Visit (HOSPITAL_COMMUNITY)
Admission: RE | Admit: 2012-07-14 | Discharge: 2012-07-14 | Disposition: A | Discharge status: Home / Self Care | Payer: BC Managed Care – PPO | Source: Ambulatory Visit | Attending: Urology | Admitting: Urology

## 2012-07-14 ENCOUNTER — Encounter (HOSPITAL_COMMUNITY)
Admission: RE | Admit: 2012-07-14 | Discharge: 2012-07-14 | Disposition: A | Discharge status: Home / Self Care | Payer: BC Managed Care – PPO | Source: Ambulatory Visit | Attending: Urology | Admitting: Urology

## 2012-07-14 DIAGNOSIS — C61 Malignant neoplasm of prostate: Secondary | ICD-10-CM | POA: Insufficient documentation

## 2012-07-14 MED ORDER — TECHNETIUM TC 99M MEDRONATE IV KIT
25.0000 | PACK | Freq: Once | INTRAVENOUS | Status: AC | PRN
  Administered 2012-07-14: 25 via INTRAVENOUS

## 2014-05-21 ENCOUNTER — Other Ambulatory Visit: Payer: Self-pay | Admitting: Urology

## 2014-05-21 DIAGNOSIS — C61 Malignant neoplasm of prostate: Secondary | ICD-10-CM

## 2014-06-09 ENCOUNTER — Encounter (HOSPITAL_COMMUNITY)
Admission: RE | Admit: 2014-06-09 | Discharge: 2014-06-09 | Disposition: A | Discharge status: Home / Self Care | Payer: BC Managed Care – PPO | Source: Ambulatory Visit | Attending: Urology | Admitting: Urology

## 2014-06-09 ENCOUNTER — Ambulatory Visit (HOSPITAL_COMMUNITY)
Admission: RE | Admit: 2014-06-09 | Discharge: 2014-06-09 | Disposition: A | Discharge status: Home / Self Care | Payer: BC Managed Care – PPO | Source: Ambulatory Visit | Attending: Urology | Admitting: Urology

## 2014-06-09 DIAGNOSIS — C61 Malignant neoplasm of prostate: Secondary | ICD-10-CM | POA: Insufficient documentation

## 2014-06-09 MED ORDER — TECHNETIUM TC 99M MEDRONATE IV KIT
26.2000 | PACK | Freq: Once | INTRAVENOUS | Status: AC | PRN
  Administered 2014-06-09: 26.2 via INTRAVENOUS

## 2015-09-12 ENCOUNTER — Other Ambulatory Visit: Payer: Self-pay | Admitting: Gastroenterology

## 2016-01-02 ENCOUNTER — Encounter (HOSPITAL_COMMUNITY): Payer: Self-pay | Admitting: *Deleted

## 2016-01-09 ENCOUNTER — Encounter (HOSPITAL_COMMUNITY)
Admission: RE | Disposition: A | Discharge status: Home / Self Care | Payer: Self-pay | Source: Ambulatory Visit | Attending: Gastroenterology

## 2016-01-09 ENCOUNTER — Ambulatory Visit (HOSPITAL_COMMUNITY)
Admission: RE | Admit: 2016-01-09 | Discharge: 2016-01-09 | Disposition: A | Discharge status: Home / Self Care | Payer: BLUE CROSS/BLUE SHIELD | Source: Ambulatory Visit | Attending: Gastroenterology | Admitting: Gastroenterology

## 2016-01-09 ENCOUNTER — Ambulatory Visit (HOSPITAL_COMMUNITY): Payer: BLUE CROSS/BLUE SHIELD | Admitting: Anesthesiology

## 2016-01-09 ENCOUNTER — Encounter (HOSPITAL_COMMUNITY): Payer: Self-pay

## 2016-01-09 DIAGNOSIS — D120 Benign neoplasm of cecum: Secondary | ICD-10-CM | POA: Diagnosis not present

## 2016-01-09 DIAGNOSIS — K5730 Diverticulosis of large intestine without perforation or abscess without bleeding: Secondary | ICD-10-CM | POA: Insufficient documentation

## 2016-01-09 DIAGNOSIS — K2270 Barrett's esophagus without dysplasia: Secondary | ICD-10-CM | POA: Diagnosis present

## 2016-01-09 DIAGNOSIS — I10 Essential (primary) hypertension: Secondary | ICD-10-CM | POA: Insufficient documentation

## 2016-01-09 DIAGNOSIS — K627 Radiation proctitis: Secondary | ICD-10-CM | POA: Insufficient documentation

## 2016-01-09 DIAGNOSIS — K219 Gastro-esophageal reflux disease without esophagitis: Secondary | ICD-10-CM | POA: Insufficient documentation

## 2016-01-09 DIAGNOSIS — K449 Diaphragmatic hernia without obstruction or gangrene: Secondary | ICD-10-CM | POA: Insufficient documentation

## 2016-01-09 DIAGNOSIS — D125 Benign neoplasm of sigmoid colon: Secondary | ICD-10-CM | POA: Diagnosis not present

## 2016-01-09 DIAGNOSIS — Z79899 Other long term (current) drug therapy: Secondary | ICD-10-CM | POA: Diagnosis not present

## 2016-01-09 DIAGNOSIS — Z1211 Encounter for screening for malignant neoplasm of colon: Secondary | ICD-10-CM | POA: Insufficient documentation

## 2016-01-09 DIAGNOSIS — D122 Benign neoplasm of ascending colon: Secondary | ICD-10-CM | POA: Diagnosis not present

## 2016-01-09 HISTORY — DX: Gastro-esophageal reflux disease without esophagitis: K21.9

## 2016-01-09 HISTORY — PX: COLONOSCOPY WITH PROPOFOL: SHX5780

## 2016-01-09 HISTORY — PX: ESOPHAGOGASTRODUODENOSCOPY (EGD) WITH PROPOFOL: SHX5813

## 2016-01-09 HISTORY — DX: Malignant (primary) neoplasm, unspecified: C80.1

## 2016-01-09 HISTORY — DX: Essential (primary) hypertension: I10

## 2016-01-09 HISTORY — DX: Personal history of urinary calculi: Z87.442

## 2016-01-09 HISTORY — DX: Barrett's esophagus without dysplasia: K22.70

## 2016-01-09 SURGERY — COLONOSCOPY WITH PROPOFOL
Anesthesia: Monitor Anesthesia Care

## 2016-01-09 MED ORDER — PROPOFOL 10 MG/ML IV BOLUS
INTRAVENOUS | Status: AC
  Filled 2016-01-09: qty 20

## 2016-01-09 MED ORDER — LACTATED RINGERS IV SOLN
INTRAVENOUS | Status: DC
  Administered 2016-01-09: 1000 mL via INTRAVENOUS

## 2016-01-09 MED ORDER — SODIUM CHLORIDE 0.9 % IV SOLN: INTRAVENOUS | Status: DC

## 2016-01-09 MED ORDER — PROPOFOL 10 MG/ML IV BOLUS
INTRAVENOUS | Status: DC | PRN
  Administered 2016-01-09: 10 mg via INTRAVENOUS
  Administered 2016-01-09 (×3): 20 mg via INTRAVENOUS
  Administered 2016-01-09: 50 mg via INTRAVENOUS

## 2016-01-09 MED ORDER — PROPOFOL 500 MG/50ML IV EMUL
INTRAVENOUS | Status: DC | PRN
  Administered 2016-01-09: 75 ug/kg/min via INTRAVENOUS

## 2016-01-09 SURGICAL SUPPLY — 25 items

## 2016-01-09 NOTE — Anesthesia Postprocedure Evaluation (Signed)
Anesthesia Post Note  Patient: Matthew [name]  Procedure(s) Performed: Procedure(s) (LRB): COLONOSCOPY WITH PROPOFOL (N/A) ESOPHAGOGASTRODUODENOSCOPY (EGD) WITH PROPOFOL (N/A)  Patient location during evaluation: PACU Anesthesia Type: MAC Level of consciousness: awake Pain management: pain level controlled Vital Signs Assessment: post-procedure vital signs reviewed and stable Respiratory status: spontaneous breathing Cardiovascular status: stable Postop Assessment: no signs of nausea or vomiting Anesthetic complications: no    Last Vitals:  Filed Vitals:   01/09/16 0644 01/09/16 0841  BP: 134/101 111/78  Pulse: 80 63  Temp: 36.6 C 36.6 C  Resp: 16 16    Last Pain: There were no vitals filed for this visit.               Aziah Kaiser

## 2016-01-09 NOTE — Transfer of Care (Signed)
Immediate Anesthesia Transfer of Care Note  Patient: Matthew [name]  Procedure(s) Performed: Procedure(s): COLONOSCOPY WITH PROPOFOL (N/A) ESOPHAGOGASTRODUODENOSCOPY (EGD) WITH PROPOFOL (N/A)  Patient Location: PACU  Anesthesia Type:MAC  Level of Consciousness:  sedated, patient cooperative and responds to stimulation  Airway & Oxygen Therapy:Patient Spontanous Breathing and Patient connected to face mask oxgen  Post-op Assessment:  Report given to PACU RN and Post -op Vital signs reviewed and stable  Post vital signs:  Reviewed and stable  Last Vitals:  Filed Vitals:   01/09/16 0644  BP: 134/101  Pulse: 80  Temp: 36.6 C  Resp: 16    Complications: No apparent anesthesia complications

## 2016-01-09 NOTE — Op Note (Signed)
Procedures: Screening colonoscopy. Surveillance esophagogastroduodenoscopy with Barrett's mucosal biopsies.  Endoscopist: Earle Gell  Premedication: Propofol administered by anesthesia  Procedure: Diagnostic esophagogastroduodenoscopy with surveillance Barrett's mucosal biopsies The patient was placed in the left lateral decubitus position. The Pentax gastroscope was passed through the posterior hypopharynx into the proximal esophagus without difficulty. The hypopharynx, larynx, and vocal cords appeared normal.  Esophagoscopy: The proximal and mid segments of the esophageal mucosa appeared normal. Barrett's mucosa without ulceration or polypoid formation extends from 30 cm from the incisor teeth to 35 cm from the incisor teeth. Four quadrant biopsies were performed at 35 cm from the incisor teeth, 33 cm from the incisor teeth, and 30 cm from the incisor teeth.  Gastroscopy: There was a moderate sized hiatal hernia. Retroflexed view of the gastric cardia and fundus was normal. The gastric body, antrum, and pylorus appeared normal.  Duodenoscopy: The duodenal bulb and descending duodenum appeared normal.  Assessment: Chronic gastroesophageal reflux associated with a moderate sized hiatal hernia and complicated by Barrett's esophagus extending from 30 cm from the incisor teeth to 35 cm from the incisor teeth. Surveillance Barrett's mucosal biopsies were performed.  Procedure: Screening colonoscopy. Anal inspection and digital rectal exam were normal. The Pentax pediatric colonoscope was introduced into the rectum and advanced to the cecum. A normal-appearing appendiceal orifice was identified. A normal-appearing ileocecal valve was intubated and the terminal ileum inspected. Colonic preparation for the exam today was satisfactory. Withdrawal time was 16 minutes.  Rectum. Normal. Retroflexed view of the distal rectum showed mild radiation proctitis  Sigmoid colon and descending colon. Colonic  diverticulosis. A 5 mm sessile polyp was removed from the distal sigmoid colon with the cold snare  Splenic flexure. Normal  Transverse colon. Normal  Hepatic flexure. Normal  Ascending colon. A 5 mm sessile polyp was removed with the cold snare from the distal ascending colon.  Cecum and ileocecal valve. A 3 mm sessile polyp was removed with the cold snare  Terminal ileum. Normal  Assessment: A small polyp was removed from the distal sigmoid colon, a small polyp was removed from the distal ascending colon, and a diminutive polyp was removed from the cecum.

## 2016-01-09 NOTE — Anesthesia Preprocedure Evaluation (Signed)
Anesthesia Evaluation  Patient identified by MRN, date of birth, ID band Patient awake    Reviewed: Allergy & Precautions, NPO status , Patient's Chart, lab work & pertinent test results, reviewed documented beta blocker date and time   History of Anesthesia Complications Negative for: history of anesthetic complications  Airway Mallampati: II  TM Distance: >3 FB Neck ROM: Full    Dental  (+) Teeth Intact,    Pulmonary neg pulmonary ROS,    breath sounds clear to auscultation       Cardiovascular hypertension, Pt. on medications and Pt. on home beta blockers (-) angina(-) Past MI and (-) CHF  Rhythm:Regular     Neuro/Psych negative neurological ROS  negative psych ROS   GI/Hepatic Neg liver ROS, GERD  Medicated and Controlled,  Endo/Other  negative endocrine ROS  Renal/GU negative Renal ROS     Musculoskeletal   Abdominal   Peds  Hematology negative hematology ROS (+)   Anesthesia Other Findings   Reproductive/Obstetrics                             Anesthesia Physical Anesthesia Plan  ASA: II  Anesthesia Plan: MAC   Post-op Pain Management:    Induction: Intravenous  Airway Management Planned: Natural Airway, Nasal Cannula and Simple Face Mask  Additional Equipment:   Intra-op Plan:   Post-operative Plan:   Informed Consent: I have reviewed the patients History and Physical, chart, labs and discussed the procedure including the risks, benefits and alternatives for the proposed anesthesia with the patient or authorized representative who has indicated his/her understanding and acceptance.   Dental advisory given  Plan Discussed with: CRNA and Surgeon  Anesthesia Plan Comments:         Anesthesia Quick Evaluation

## 2016-01-09 NOTE — Discharge Instructions (Signed)
Colonoscopy, Care After °These instructions give you information on caring for yourself after your procedure. Your doctor may also give you more specific instructions. Call your doctor if you have any problems or questions after your procedure. °HOME CARE °· Do not drive for 24 hours. °· Do not sign important papers or use machinery for 24 hours. °· You may shower. °· You may go back to your usual activities, but go slower for the first 24 hours. °· Take rest breaks often during the first 24 hours. °· Walk around or use warm packs on your belly (abdomen) if you have belly cramping or gas. °· Drink enough fluids to keep your pee (urine) clear or pale yellow. °· Resume your normal diet. Avoid heavy or fried foods. °· Avoid drinking alcohol for 24 hours or as told by your doctor. °· Only take medicines as told by your doctor. °If a tissue sample (biopsy) was taken during the procedure:  °· Do not take aspirin or blood thinners for 7 days, or as told by your doctor. °· Do not drink alcohol for 7 days, or as told by your doctor. °· Eat soft foods for the first 24 hours. °GET HELP IF: °You still have a small amount of blood in your poop (stool) 2-3 days after the procedure. °GET HELP RIGHT AWAY IF: °· You have more than a small amount of blood in your poop. °· You see clumps of tissue (blood clots) in your poop. °· Your belly is puffy (swollen). °· You feel sick to your stomach (nauseous) or throw up (vomit). °· You have a fever. °· You have belly pain that gets worse and medicine does not help. °MAKE SURE YOU: °· Understand these instructions. °· Will watch your condition. °· Will get help right away if you are not doing well or get worse. °  °This information is not intended to replace advice given to you by your health care provider. Make sure you discuss any questions you have with your health care provider. °  °Document Released: 12/22/2010 Document Revised: 11/24/2013 Document Reviewed: 07/27/2013 °Elsevier  Interactive Patient Education ©2016 [employer] °Esophagogastroduodenoscopy, Care After °Refer to this sheet in the next few weeks. These instructions provide you with information about caring for yourself after your procedure. Your health care provider may also give you more specific instructions. Your treatment has been planned according to current medical practices, but problems sometimes occur. Call your health care provider if you have any problems or questions after your procedure. °WHAT TO EXPECT AFTER THE PROCEDURE °After your procedure, it is typical to feel: °· Soreness in your throat. °· Pain with swallowing. °· Sick to your stomach (nauseous). °· Bloated. °· Dizzy. °· Fatigued. °HOME CARE INSTRUCTIONS °· Do not eat or drink anything until the numbing medicine (local anesthetic) has worn off and your gag reflex has returned. You will know that the local anesthetic has worn off when you can swallow comfortably. °· Do not drive or operate machinery until directed by your health care provider. °· Take medicines only as directed by your health care provider. °SEEK MEDICAL CARE IF:  °· You cannot stop coughing. °· You are not urinating at all or less than usual. °SEEK IMMEDIATE MEDICAL CARE IF: °· You have difficulty swallowing. °· You cannot eat or drink. °· You have worsening throat or chest pain. °· You have dizziness or lightheadedness or you faint. °· You have nausea or vomiting. °· You have chills. °· You have a fever. °·   You have severe abdominal pain. °· You have black, tarry, or bloody stools. °  °This information is not intended to replace advice given to you by your health care provider. Make sure you discuss any questions you have with your health care provider. °  °Document Released: 11/05/2012 Document Revised: 12/10/2014 Document Reviewed: 11/05/2012 °Elsevier Interactive Patient Education ©2016 Elsevier Inc. ° °

## 2016-01-09 NOTE — H&P (Signed)
  Problem: Barrett's esophagus. Screening colonoscopy.  History: The patient is a 75 year old male born Jul 05, 1941. On 04/09/2011 he underwent a normal screening colonoscopy with fair colon preparation. He has asymptomatic Barrett's esophagus extending from 30 cm to 35 cm from the incisor teeth.  The patient is scheduled to undergo a repeat screening colonoscopy and surveillance esophagogastroduodenoscopy with Barrett's mucosal biopsies.  Past medical history: Prostate cancer. Prostatectomy performed in 2004. Radiation therapy performed in 2010. Radiation proctitis. Hypertension. Kidney stone. Shingles diagnosed in 2007. Barrett's esophagus.  Exam: The patient is alert and lying comfortably on the endoscopy stretcher. Abdomen is soft and nontender to palpation. Cardiac exam reveals a regular rhythm. Lungs are clear to auscultation.  Plan: Proceed with screening colonoscopy and surveillance esophagogastroduodenoscopy with Barrett's mucosal biopsies.

## 2016-01-11 ENCOUNTER — Encounter (HOSPITAL_COMMUNITY): Payer: Self-pay | Admitting: Gastroenterology

## 2016-03-21 ENCOUNTER — Other Ambulatory Visit: Payer: Self-pay | Admitting: Internal Medicine

## 2016-03-21 DIAGNOSIS — R600 Localized edema: Secondary | ICD-10-CM

## 2016-03-22 ENCOUNTER — Ambulatory Visit
Admission: RE | Admit: 2016-03-22 | Discharge: 2016-03-22 | Disposition: A | Discharge status: Home / Self Care | Payer: BLUE CROSS/BLUE SHIELD | Source: Ambulatory Visit | Attending: Internal Medicine | Admitting: Internal Medicine

## 2016-03-22 DIAGNOSIS — R600 Localized edema: Secondary | ICD-10-CM

## 2016-07-12 ENCOUNTER — Other Ambulatory Visit: Payer: Self-pay | Admitting: Urology

## 2016-07-12 DIAGNOSIS — C61 Malignant neoplasm of prostate: Secondary | ICD-10-CM

## 2016-08-13 ENCOUNTER — Encounter (HOSPITAL_COMMUNITY)
Admission: RE | Admit: 2016-08-13 | Discharge: 2016-08-13 | Disposition: A | Discharge status: Home / Self Care | Payer: BLUE CROSS/BLUE SHIELD | Source: Ambulatory Visit | Attending: Urology | Admitting: Urology

## 2016-08-13 DIAGNOSIS — C61 Malignant neoplasm of prostate: Secondary | ICD-10-CM

## 2016-08-13 MED ORDER — TECHNETIUM TC 99M MEDRONATE IV KIT
19.6200 | PACK | Freq: Once | INTRAVENOUS | Status: AC | PRN
  Administered 2016-08-13: 19.62 via INTRAVENOUS

## 2016-12-27 DIAGNOSIS — Z23 Encounter for immunization: Secondary | ICD-10-CM | POA: Diagnosis not present

## 2017-01-30 DIAGNOSIS — N183 Chronic kidney disease, stage 3 (moderate): Secondary | ICD-10-CM | POA: Diagnosis not present

## 2017-01-30 DIAGNOSIS — I10 Essential (primary) hypertension: Secondary | ICD-10-CM | POA: Diagnosis not present

## 2017-01-30 DIAGNOSIS — Z1389 Encounter for screening for other disorder: Secondary | ICD-10-CM | POA: Diagnosis not present

## 2017-01-30 DIAGNOSIS — Z0000 Encounter for general adult medical examination without abnormal findings: Secondary | ICD-10-CM | POA: Diagnosis not present

## 2017-01-30 DIAGNOSIS — E7800 Pure hypercholesterolemia, unspecified: Secondary | ICD-10-CM | POA: Diagnosis not present

## 2017-01-30 DIAGNOSIS — R7309 Other abnormal glucose: Secondary | ICD-10-CM | POA: Diagnosis not present

## 2017-01-30 DIAGNOSIS — K2210 Ulcer of esophagus without bleeding: Secondary | ICD-10-CM | POA: Diagnosis not present

## 2017-01-30 DIAGNOSIS — C61 Malignant neoplasm of prostate: Secondary | ICD-10-CM | POA: Diagnosis not present

## 2017-01-30 DIAGNOSIS — K2270 Barrett's esophagus without dysplasia: Secondary | ICD-10-CM | POA: Diagnosis not present

## 2017-04-24 DIAGNOSIS — R5383 Other fatigue: Secondary | ICD-10-CM | POA: Diagnosis not present

## 2017-04-24 DIAGNOSIS — W57XXXA Bitten or stung by nonvenomous insect and other nonvenomous arthropods, initial encounter: Secondary | ICD-10-CM | POA: Diagnosis not present

## 2017-04-24 DIAGNOSIS — R6883 Chills (without fever): Secondary | ICD-10-CM | POA: Diagnosis not present

## 2017-05-03 DIAGNOSIS — L03032 Cellulitis of left toe: Secondary | ICD-10-CM | POA: Diagnosis not present

## 2017-05-03 DIAGNOSIS — L02612 Cutaneous abscess of left foot: Secondary | ICD-10-CM | POA: Diagnosis not present

## 2017-05-03 DIAGNOSIS — M79675 Pain in left toe(s): Secondary | ICD-10-CM | POA: Diagnosis not present

## 2017-05-07 DIAGNOSIS — M79604 Pain in right leg: Secondary | ICD-10-CM | POA: Diagnosis not present

## 2017-05-07 DIAGNOSIS — I8311 Varicose veins of right lower extremity with inflammation: Secondary | ICD-10-CM | POA: Diagnosis not present

## 2017-05-07 DIAGNOSIS — R600 Localized edema: Secondary | ICD-10-CM | POA: Diagnosis not present

## 2017-05-21 DIAGNOSIS — L03032 Cellulitis of left toe: Secondary | ICD-10-CM | POA: Diagnosis not present

## 2017-06-04 DIAGNOSIS — H903 Sensorineural hearing loss, bilateral: Secondary | ICD-10-CM | POA: Diagnosis not present

## 2017-06-19 DIAGNOSIS — C61 Malignant neoplasm of prostate: Secondary | ICD-10-CM | POA: Diagnosis not present

## 2017-06-20 DIAGNOSIS — I8311 Varicose veins of right lower extremity with inflammation: Secondary | ICD-10-CM | POA: Diagnosis not present

## 2017-06-20 DIAGNOSIS — I83811 Varicose veins of right lower extremities with pain: Secondary | ICD-10-CM | POA: Diagnosis not present

## 2017-06-20 DIAGNOSIS — I83891 Varicose veins of right lower extremities with other complications: Secondary | ICD-10-CM | POA: Diagnosis not present

## 2017-06-26 DIAGNOSIS — C61 Malignant neoplasm of prostate: Secondary | ICD-10-CM | POA: Diagnosis not present

## 2017-06-26 DIAGNOSIS — N393 Stress incontinence (female) (male): Secondary | ICD-10-CM | POA: Diagnosis not present

## 2017-06-29 DIAGNOSIS — S51811A Laceration without foreign body of right forearm, initial encounter: Secondary | ICD-10-CM | POA: Diagnosis not present

## 2017-06-29 DIAGNOSIS — W540XXA Bitten by dog, initial encounter: Secondary | ICD-10-CM | POA: Diagnosis not present

## 2017-07-16 DIAGNOSIS — E7800 Pure hypercholesterolemia, unspecified: Secondary | ICD-10-CM | POA: Diagnosis not present

## 2017-07-16 DIAGNOSIS — C61 Malignant neoplasm of prostate: Secondary | ICD-10-CM | POA: Diagnosis not present

## 2017-07-16 DIAGNOSIS — I10 Essential (primary) hypertension: Secondary | ICD-10-CM | POA: Diagnosis not present

## 2017-07-16 DIAGNOSIS — N183 Chronic kidney disease, stage 3 (moderate): Secondary | ICD-10-CM | POA: Diagnosis not present

## 2017-07-16 DIAGNOSIS — Z4802 Encounter for removal of sutures: Secondary | ICD-10-CM | POA: Diagnosis not present

## 2017-07-16 DIAGNOSIS — K219 Gastro-esophageal reflux disease without esophagitis: Secondary | ICD-10-CM | POA: Diagnosis not present

## 2017-07-29 DIAGNOSIS — N183 Chronic kidney disease, stage 3 (moderate): Secondary | ICD-10-CM | POA: Diagnosis not present

## 2017-07-29 DIAGNOSIS — N200 Calculus of kidney: Secondary | ICD-10-CM | POA: Diagnosis not present

## 2017-07-29 DIAGNOSIS — I129 Hypertensive chronic kidney disease with stage 1 through stage 4 chronic kidney disease, or unspecified chronic kidney disease: Secondary | ICD-10-CM | POA: Diagnosis not present

## 2017-07-29 DIAGNOSIS — E669 Obesity, unspecified: Secondary | ICD-10-CM | POA: Diagnosis not present

## 2017-07-29 DIAGNOSIS — Z8546 Personal history of malignant neoplasm of prostate: Secondary | ICD-10-CM | POA: Diagnosis not present

## 2017-07-29 DIAGNOSIS — N2581 Secondary hyperparathyroidism of renal origin: Secondary | ICD-10-CM | POA: Diagnosis not present

## 2017-07-31 DIAGNOSIS — I8311 Varicose veins of right lower extremity with inflammation: Secondary | ICD-10-CM | POA: Diagnosis not present

## 2017-07-31 DIAGNOSIS — I83891 Varicose veins of right lower extremities with other complications: Secondary | ICD-10-CM | POA: Diagnosis not present

## 2017-08-02 DIAGNOSIS — I8311 Varicose veins of right lower extremity with inflammation: Secondary | ICD-10-CM | POA: Diagnosis not present

## 2017-08-07 DIAGNOSIS — I10 Essential (primary) hypertension: Secondary | ICD-10-CM | POA: Diagnosis not present

## 2017-08-07 DIAGNOSIS — C61 Malignant neoplasm of prostate: Secondary | ICD-10-CM | POA: Diagnosis not present

## 2017-08-07 DIAGNOSIS — N183 Chronic kidney disease, stage 3 (moderate): Secondary | ICD-10-CM | POA: Diagnosis not present

## 2017-08-07 DIAGNOSIS — E7800 Pure hypercholesterolemia, unspecified: Secondary | ICD-10-CM | POA: Diagnosis not present

## 2017-09-06 DIAGNOSIS — I8311 Varicose veins of right lower extremity with inflammation: Secondary | ICD-10-CM | POA: Diagnosis not present

## 2017-09-06 DIAGNOSIS — I83891 Varicose veins of right lower extremities with other complications: Secondary | ICD-10-CM | POA: Diagnosis not present

## 2017-09-20 DIAGNOSIS — I8311 Varicose veins of right lower extremity with inflammation: Secondary | ICD-10-CM | POA: Diagnosis not present

## 2017-09-20 DIAGNOSIS — I83891 Varicose veins of right lower extremities with other complications: Secondary | ICD-10-CM | POA: Diagnosis not present

## 2017-10-15 ENCOUNTER — Other Ambulatory Visit: Payer: Self-pay | Admitting: Internal Medicine

## 2017-10-15 DIAGNOSIS — N6320 Unspecified lump in the left breast, unspecified quadrant: Secondary | ICD-10-CM

## 2017-10-16 ENCOUNTER — Ambulatory Visit: Payer: BLUE CROSS/BLUE SHIELD

## 2017-10-16 ENCOUNTER — Ambulatory Visit
Admission: RE | Admit: 2017-10-16 | Discharge: 2017-10-16 | Disposition: A | Discharge status: Home / Self Care | Payer: BLUE CROSS/BLUE SHIELD | Source: Ambulatory Visit | Attending: Internal Medicine | Admitting: Internal Medicine

## 2017-10-16 DIAGNOSIS — N6320 Unspecified lump in the left breast, unspecified quadrant: Secondary | ICD-10-CM

## 2018-08-06 DIAGNOSIS — Z8546 Personal history of malignant neoplasm of prostate: Secondary | ICD-10-CM | POA: Diagnosis not present

## 2018-08-06 DIAGNOSIS — N200 Calculus of kidney: Secondary | ICD-10-CM | POA: Diagnosis not present

## 2018-08-06 DIAGNOSIS — N2581 Secondary hyperparathyroidism of renal origin: Secondary | ICD-10-CM | POA: Diagnosis not present

## 2018-08-06 DIAGNOSIS — N183 Chronic kidney disease, stage 3 (moderate): Secondary | ICD-10-CM | POA: Diagnosis not present

## 2018-08-06 DIAGNOSIS — N390 Urinary tract infection, site not specified: Secondary | ICD-10-CM | POA: Diagnosis not present

## 2018-08-06 DIAGNOSIS — Z23 Encounter for immunization: Secondary | ICD-10-CM | POA: Diagnosis not present

## 2018-08-06 DIAGNOSIS — I129 Hypertensive chronic kidney disease with stage 1 through stage 4 chronic kidney disease, or unspecified chronic kidney disease: Secondary | ICD-10-CM | POA: Diagnosis not present

## 2018-09-05 DIAGNOSIS — E7800 Pure hypercholesterolemia, unspecified: Secondary | ICD-10-CM | POA: Diagnosis not present

## 2018-09-05 DIAGNOSIS — I10 Essential (primary) hypertension: Secondary | ICD-10-CM | POA: Diagnosis not present

## 2018-09-05 DIAGNOSIS — C61 Malignant neoplasm of prostate: Secondary | ICD-10-CM | POA: Diagnosis not present

## 2018-09-05 DIAGNOSIS — K2210 Ulcer of esophagus without bleeding: Secondary | ICD-10-CM | POA: Diagnosis not present

## 2018-09-05 DIAGNOSIS — N183 Chronic kidney disease, stage 3 (moderate): Secondary | ICD-10-CM | POA: Diagnosis not present

## 2018-09-05 DIAGNOSIS — K210 Gastro-esophageal reflux disease with esophagitis: Secondary | ICD-10-CM | POA: Diagnosis not present

## 2018-12-02 DIAGNOSIS — J209 Acute bronchitis, unspecified: Secondary | ICD-10-CM | POA: Diagnosis not present

## 2018-12-23 DIAGNOSIS — Z8546 Personal history of malignant neoplasm of prostate: Secondary | ICD-10-CM | POA: Diagnosis not present

## 2018-12-23 DIAGNOSIS — I10 Essential (primary) hypertension: Secondary | ICD-10-CM | POA: Diagnosis not present

## 2018-12-23 DIAGNOSIS — I739 Peripheral vascular disease, unspecified: Secondary | ICD-10-CM | POA: Diagnosis not present

## 2018-12-23 DIAGNOSIS — H9193 Unspecified hearing loss, bilateral: Secondary | ICD-10-CM | POA: Diagnosis not present

## 2018-12-23 DIAGNOSIS — Z6831 Body mass index (BMI) 31.0-31.9, adult: Secondary | ICD-10-CM | POA: Diagnosis not present

## 2018-12-23 DIAGNOSIS — E669 Obesity, unspecified: Secondary | ICD-10-CM | POA: Diagnosis not present

## 2018-12-23 DIAGNOSIS — M109 Gout, unspecified: Secondary | ICD-10-CM | POA: Diagnosis not present

## 2018-12-23 DIAGNOSIS — E785 Hyperlipidemia, unspecified: Secondary | ICD-10-CM | POA: Diagnosis not present

## 2018-12-23 DIAGNOSIS — K219 Gastro-esophageal reflux disease without esophagitis: Secondary | ICD-10-CM | POA: Diagnosis not present

## 2019-03-09 DIAGNOSIS — Z0000 Encounter for general adult medical examination without abnormal findings: Secondary | ICD-10-CM | POA: Diagnosis not present

## 2019-03-09 DIAGNOSIS — K210 Gastro-esophageal reflux disease with esophagitis: Secondary | ICD-10-CM | POA: Diagnosis not present

## 2019-03-09 DIAGNOSIS — N183 Chronic kidney disease, stage 3 (moderate): Secondary | ICD-10-CM | POA: Diagnosis not present

## 2019-03-09 DIAGNOSIS — I10 Essential (primary) hypertension: Secondary | ICD-10-CM | POA: Diagnosis not present

## 2019-03-09 DIAGNOSIS — N200 Calculus of kidney: Secondary | ICD-10-CM | POA: Diagnosis not present

## 2019-03-09 DIAGNOSIS — R7309 Other abnormal glucose: Secondary | ICD-10-CM | POA: Diagnosis not present

## 2019-03-09 DIAGNOSIS — Z8546 Personal history of malignant neoplasm of prostate: Secondary | ICD-10-CM | POA: Diagnosis not present

## 2019-03-09 DIAGNOSIS — Z1389 Encounter for screening for other disorder: Secondary | ICD-10-CM | POA: Diagnosis not present

## 2019-03-09 DIAGNOSIS — E7800 Pure hypercholesterolemia, unspecified: Secondary | ICD-10-CM | POA: Diagnosis not present

## 2019-03-12 DIAGNOSIS — I10 Essential (primary) hypertension: Secondary | ICD-10-CM | POA: Diagnosis not present

## 2019-03-12 DIAGNOSIS — R7309 Other abnormal glucose: Secondary | ICD-10-CM | POA: Diagnosis not present

## 2019-03-12 DIAGNOSIS — C61 Malignant neoplasm of prostate: Secondary | ICD-10-CM | POA: Diagnosis not present

## 2019-03-12 DIAGNOSIS — E7800 Pure hypercholesterolemia, unspecified: Secondary | ICD-10-CM | POA: Diagnosis not present

## 2019-08-14 DIAGNOSIS — H2512 Age-related nuclear cataract, left eye: Secondary | ICD-10-CM | POA: Diagnosis not present

## 2019-08-14 DIAGNOSIS — H35371 Puckering of macula, right eye: Secondary | ICD-10-CM | POA: Diagnosis not present

## 2019-08-14 DIAGNOSIS — H353131 Nonexudative age-related macular degeneration, bilateral, early dry stage: Secondary | ICD-10-CM | POA: Diagnosis not present

## 2019-08-14 DIAGNOSIS — H2513 Age-related nuclear cataract, bilateral: Secondary | ICD-10-CM | POA: Diagnosis not present

## 2019-08-14 DIAGNOSIS — H25043 Posterior subcapsular polar age-related cataract, bilateral: Secondary | ICD-10-CM | POA: Diagnosis not present

## 2019-08-14 DIAGNOSIS — H25013 Cortical age-related cataract, bilateral: Secondary | ICD-10-CM | POA: Diagnosis not present

## 2019-08-14 DIAGNOSIS — H18413 Arcus senilis, bilateral: Secondary | ICD-10-CM | POA: Diagnosis not present

## 2019-09-03 DIAGNOSIS — H2512 Age-related nuclear cataract, left eye: Secondary | ICD-10-CM | POA: Diagnosis not present

## 2019-09-04 DIAGNOSIS — H2511 Age-related nuclear cataract, right eye: Secondary | ICD-10-CM | POA: Diagnosis not present

## 2019-09-04 DIAGNOSIS — H2512 Age-related nuclear cataract, left eye: Secondary | ICD-10-CM | POA: Diagnosis not present

## 2019-09-08 DIAGNOSIS — Z8546 Personal history of malignant neoplasm of prostate: Secondary | ICD-10-CM | POA: Diagnosis not present

## 2019-09-08 DIAGNOSIS — Z23 Encounter for immunization: Secondary | ICD-10-CM | POA: Diagnosis not present

## 2019-09-08 DIAGNOSIS — I129 Hypertensive chronic kidney disease with stage 1 through stage 4 chronic kidney disease, or unspecified chronic kidney disease: Secondary | ICD-10-CM | POA: Diagnosis not present

## 2019-09-08 DIAGNOSIS — N183 Chronic kidney disease, stage 3 unspecified: Secondary | ICD-10-CM | POA: Diagnosis not present

## 2019-09-08 DIAGNOSIS — N200 Calculus of kidney: Secondary | ICD-10-CM | POA: Diagnosis not present

## 2019-09-08 DIAGNOSIS — N2581 Secondary hyperparathyroidism of renal origin: Secondary | ICD-10-CM | POA: Diagnosis not present

## 2019-09-10 ENCOUNTER — Other Ambulatory Visit: Payer: Self-pay | Admitting: Nephrology

## 2019-09-10 ENCOUNTER — Other Ambulatory Visit (HOSPITAL_COMMUNITY): Payer: Self-pay | Admitting: Nephrology

## 2019-09-10 DIAGNOSIS — N2581 Secondary hyperparathyroidism of renal origin: Secondary | ICD-10-CM

## 2019-09-17 ENCOUNTER — Other Ambulatory Visit: Payer: Self-pay

## 2019-09-17 ENCOUNTER — Encounter (HOSPITAL_COMMUNITY)
Admission: RE | Admit: 2019-09-17 | Discharge: 2019-09-17 | Disposition: A | Discharge status: Home / Self Care | Payer: Medicare PPO | Source: Ambulatory Visit | Attending: Nephrology | Admitting: Nephrology

## 2019-09-17 DIAGNOSIS — N2581 Secondary hyperparathyroidism of renal origin: Secondary | ICD-10-CM | POA: Insufficient documentation

## 2019-09-17 DIAGNOSIS — E211 Secondary hyperparathyroidism, not elsewhere classified: Secondary | ICD-10-CM | POA: Diagnosis not present

## 2019-09-17 DIAGNOSIS — H2511 Age-related nuclear cataract, right eye: Secondary | ICD-10-CM | POA: Diagnosis not present

## 2019-09-17 MED ORDER — TECHNETIUM TC 99M SESTAMIBI GENERIC - CARDIOLITE
25.0000 | Freq: Once | INTRAVENOUS | Status: AC | PRN
  Administered 2019-09-17: 25 via INTRAVENOUS

## 2019-09-18 DIAGNOSIS — H2511 Age-related nuclear cataract, right eye: Secondary | ICD-10-CM | POA: Diagnosis not present

## 2019-11-12 DIAGNOSIS — E7800 Pure hypercholesterolemia, unspecified: Secondary | ICD-10-CM | POA: Diagnosis not present

## 2019-11-12 DIAGNOSIS — N200 Calculus of kidney: Secondary | ICD-10-CM | POA: Diagnosis not present

## 2019-11-12 DIAGNOSIS — K2270 Barrett's esophagus without dysplasia: Secondary | ICD-10-CM | POA: Diagnosis not present

## 2019-11-12 DIAGNOSIS — N2581 Secondary hyperparathyroidism of renal origin: Secondary | ICD-10-CM | POA: Diagnosis not present

## 2019-11-12 DIAGNOSIS — N1831 Chronic kidney disease, stage 3a: Secondary | ICD-10-CM | POA: Diagnosis not present

## 2019-11-12 DIAGNOSIS — M549 Dorsalgia, unspecified: Secondary | ICD-10-CM | POA: Diagnosis not present

## 2019-11-12 DIAGNOSIS — C61 Malignant neoplasm of prostate: Secondary | ICD-10-CM | POA: Diagnosis not present

## 2019-11-12 DIAGNOSIS — I10 Essential (primary) hypertension: Secondary | ICD-10-CM | POA: Diagnosis not present

## 2019-11-19 DIAGNOSIS — Z87442 Personal history of urinary calculi: Secondary | ICD-10-CM | POA: Diagnosis not present

## 2019-11-19 DIAGNOSIS — E349 Endocrine disorder, unspecified: Secondary | ICD-10-CM | POA: Diagnosis not present

## 2019-11-19 DIAGNOSIS — N189 Chronic kidney disease, unspecified: Secondary | ICD-10-CM | POA: Diagnosis not present

## 2019-11-25 ENCOUNTER — Other Ambulatory Visit: Payer: Self-pay | Admitting: Surgery

## 2019-11-25 DIAGNOSIS — E349 Endocrine disorder, unspecified: Secondary | ICD-10-CM

## 2019-12-01 DIAGNOSIS — C61 Malignant neoplasm of prostate: Secondary | ICD-10-CM | POA: Diagnosis not present

## 2019-12-07 DIAGNOSIS — C61 Malignant neoplasm of prostate: Secondary | ICD-10-CM | POA: Diagnosis not present

## 2019-12-07 DIAGNOSIS — N393 Stress incontinence (female) (male): Secondary | ICD-10-CM | POA: Diagnosis not present

## 2019-12-07 DIAGNOSIS — N3020 Other chronic cystitis without hematuria: Secondary | ICD-10-CM | POA: Diagnosis not present

## 2019-12-07 DIAGNOSIS — N200 Calculus of kidney: Secondary | ICD-10-CM | POA: Diagnosis not present

## 2019-12-09 DIAGNOSIS — Z20828 Contact with and (suspected) exposure to other viral communicable diseases: Secondary | ICD-10-CM | POA: Diagnosis not present

## 2019-12-14 ENCOUNTER — Inpatient Hospital Stay: Admission: RE | Admit: 2019-12-14 | Payer: Medicare PPO | Source: Ambulatory Visit

## 2019-12-14 ENCOUNTER — Other Ambulatory Visit: Payer: Medicare PPO

## 2019-12-28 ENCOUNTER — Other Ambulatory Visit: Payer: Medicare PPO

## 2020-01-07 DIAGNOSIS — N189 Chronic kidney disease, unspecified: Secondary | ICD-10-CM | POA: Diagnosis not present

## 2020-01-08 ENCOUNTER — Ambulatory Visit
Admission: RE | Admit: 2020-01-08 | Discharge: 2020-01-08 | Disposition: A | Discharge status: Home / Self Care | Payer: Medicare PPO | Source: Ambulatory Visit | Attending: Surgery | Admitting: Surgery

## 2020-01-08 DIAGNOSIS — E041 Nontoxic single thyroid nodule: Secondary | ICD-10-CM | POA: Diagnosis not present

## 2020-01-08 DIAGNOSIS — E211 Secondary hyperparathyroidism, not elsewhere classified: Secondary | ICD-10-CM | POA: Diagnosis not present

## 2020-01-08 DIAGNOSIS — E349 Endocrine disorder, unspecified: Secondary | ICD-10-CM

## 2020-01-08 DIAGNOSIS — D351 Benign neoplasm of parathyroid gland: Secondary | ICD-10-CM | POA: Diagnosis not present

## 2020-01-08 MED ORDER — IOPAMIDOL (ISOVUE-300) INJECTION 61%
75.0000 mL | Freq: Once | INTRAVENOUS | Status: AC | PRN
  Administered 2020-01-08: 15:00:00 75 mL via INTRAVENOUS

## 2020-01-15 ENCOUNTER — Ambulatory Visit: Payer: Self-pay | Admitting: Surgery

## 2020-02-12 ENCOUNTER — Ambulatory Visit: Payer: Medicare PPO | Attending: Internal Medicine

## 2020-02-12 DIAGNOSIS — Z23 Encounter for immunization: Secondary | ICD-10-CM

## 2020-02-12 NOTE — Progress Notes (Signed)
   Covid-19 Vaccination Clinic  Name:  Matthew [name]    MRN: [id number] DOB: 1941/08/13  02/12/2020  Matthew [name] was observed post Covid-19 immunization for 15 minutes without incident. He was provided with Vaccine Information Sheet and instruction to access the V-Safe system.   Matthew [name] was instructed to call 911 with any severe reactions post vaccine: Marland Kitchen Difficulty breathing  . Swelling of face and throat  . A fast heartbeat  . A bad rash all over body  . Dizziness and weakness   Immunizations Administered    Name Date Dose VIS Date Route   Pfizer COVID-19 Vaccine 02/12/2020 11:27 AM 0.3 mL 11/13/2019 Intramuscular   Manufacturer: [place]   Lot: [id number]   Ali Chukson: [id number]

## 2020-02-16 NOTE — Patient Instructions (Addendum)
DUE TO COVID-19 ONLY ONE VISITOR IS ALLOWED TO COME WITH YOU AND STAY IN THE WAITING ROOM ONLY DURING PRE OP AND PROCEDURE DAY OF SURGERY. THE 1 VISITOR MAY VISIT WITH YOU AFTER SURGERY IN YOUR PRIVATE ROOM DURING VISITING HOURS ONLY!   10am -8pm  YOU NEED TO HAVE A COVID 19 TEST ON__3-22-21_____ @__0915  am_____, THIS TEST MUST BE DONE BEFORE SURGERY, COME  [address], [place] , [address].  ([place]) ONCE YOUR COVID TEST IS COMPLETED, PLEASE BEGIN THE QUARANTINE INSTRUCTIONS AS OUTLINED IN YOUR HANDOUT.                Matthew [name]  02/16/2020   Your procedure is scheduled on: 02-25-20   Report to [facility] Main  Entrance   Report to admitting at       0900 AM     Call this number if you have problems the morning of surgery [phone]    Remember: Do not eat food or drink liquids :After Midnight.   BRUSH YOUR TEETH MORNING OF SURGERY AND RINSE YOUR MOUTH OUT, NO CHEWING GUM CANDY OR MINTS.     Take these medicines the morning of surgery with A SIP OF WATER: allopurinol, amlodipine, lipitor, nexium, metoprolol                                 You may not have any metal on your body including hair pins and              piercings  Do not wear jewelry,  lotions, powders or perfumes, deodorant              Men may shave face and neck.   Do not bring valuables to the hospital. [place].  Contacts, dentures or bridgework may not be worn into surgery.      Patients discharged the day of surgery will not be allowed to drive home. IF YOU ARE HAVING SURGERY AND GOING HOME THE SAME DAY, YOU MUST HAVE AN ADULT TO DRIVE YOU HOME AND BE WITH YOU FOR 24 HOURS. YOU MAY GO HOME BY TAXI OR UBER OR ORTHERWISE, BUT AN ADULT MUST ACCOMPANY YOU HOME AND STAY WITH YOU FOR 24 HOURS.  Name and phone number of your driver:  Special Instructions: N/A              Please read over the following fact sheets you were  given: _____________________________________________________________________             [facility] - Preparing for Surgery Before surgery, you can play an important role.  Because skin is not sterile, your skin needs to be as free of germs as possible.  You can reduce the number of germs on your skin by washing with CHG (chlorahexidine gluconate) soap before surgery.  CHG is an antiseptic cleaner which kills germs and bonds with the skin to continue killing germs even after washing. Please DO NOT use if you have an allergy to CHG or antibacterial soaps.  If your skin becomes reddened/irritated stop using the CHG and inform your nurse when you arrive at Short Stay. Do not shave (including legs and underarms) for at least 48 hours prior to the first CHG shower.  You may shave your face/neck. Please follow these instructions  carefully:  1.  Shower with CHG Soap the night before surgery and the  morning of Surgery.  2.  If you choose to wash your hair, wash your hair first as usual with your  normal  shampoo.  3.  After you shampoo, rinse your hair and body thoroughly to remove the  shampoo.                           4.  Use CHG as you would any other liquid soap.  You can apply chg directly  to the skin and wash                       Gently with a scrungie or clean washcloth.  5.  Apply the CHG Soap to your body ONLY FROM THE NECK DOWN.   Do not use on face/ open                           Wound or open sores. Avoid contact with eyes, ears mouth and genitals (private parts).                       Wash face,  Genitals (private parts) with your normal soap.             6.  Wash thoroughly, paying special attention to the area where your surgery  will be performed.  7.  Thoroughly rinse your body with warm water from the neck down.  8.  DO NOT shower/wash with your normal soap after using and rinsing off  the CHG Soap.                9.  Pat yourself dry with a clean towel.            10.  Wear  clean pajamas.            11.  Place clean sheets on your bed the night of your first shower and do not  sleep with pets. Day of Surgery : Do not apply any lotions/deodorants the morning of surgery.  Please wear clean clothes to the hospital/surgery center.  FAILURE TO FOLLOW THESE INSTRUCTIONS MAY RESULT IN THE CANCELLATION OF YOUR SURGERY PATIENT SIGNATURE_________________________________  NURSE SIGNATURE__________________________________  ________________________________________________________________________

## 2020-02-18 ENCOUNTER — Other Ambulatory Visit: Payer: Self-pay

## 2020-02-18 ENCOUNTER — Encounter (HOSPITAL_COMMUNITY): Payer: Self-pay

## 2020-02-18 ENCOUNTER — Encounter (HOSPITAL_COMMUNITY)
Admission: RE | Admit: 2020-02-18 | Discharge: 2020-02-18 | Disposition: A | Discharge status: Home / Self Care | Payer: Medicare PPO | Source: Ambulatory Visit | Attending: Surgery | Admitting: Surgery

## 2020-02-18 DIAGNOSIS — Z01818 Encounter for other preprocedural examination: Secondary | ICD-10-CM | POA: Diagnosis not present

## 2020-02-18 HISTORY — DX: Pneumonia, unspecified organism: J18.9

## 2020-02-18 LAB — CBC
HCT: 44.9 % (ref 39.0–52.0)
Hemoglobin: 14.6 g/dL (ref 13.0–17.0)
MCH: 31.7 pg (ref 26.0–34.0)
MCHC: 32.5 g/dL (ref 30.0–36.0)
MCV: 97.6 fL (ref 80.0–100.0)
Platelets: 308 10*3/uL (ref 150–400)
RBC: 4.6 MIL/uL (ref 4.22–5.81)
RDW: 13.9 % (ref 11.5–15.5)
WBC: 8.7 10*3/uL (ref 4.0–10.5)
nRBC: 0 % (ref 0.0–0.2)

## 2020-02-18 LAB — BASIC METABOLIC PANEL
Anion gap: 6 (ref 5–15)
BUN: 25 mg/dL — ABNORMAL HIGH (ref 8–23)
CO2: 27 mmol/L (ref 22–32)
Calcium: 9.9 mg/dL (ref 8.9–10.3)
Chloride: 107 mmol/L (ref 98–111)
Creatinine, Ser: 1.31 mg/dL — ABNORMAL HIGH (ref 0.61–1.24)
GFR calc Af Amer: >60 mL/min (ref >60)
GFR calc non Af Amer: 52 mL/min — ABNORMAL LOW (ref >60)
Glucose, Bld: 109 mg/dL — ABNORMAL HIGH (ref 70–99)
Potassium: 4 mmol/L (ref 3.5–5.1)
Sodium: 140 mmol/L (ref 135–145)

## 2020-02-18 NOTE — Progress Notes (Signed)
PCP - Wenda Low Cardiologist -   Chest x-ray -  EKG Stress Test -  ECHO -  Cardiac Cath -   Sleep Study -  CPAP -   Fasting Blood Sugar -  Checks Blood Sugar _____ times a day  Blood Thinner Instructions: Aspirin Instructions:  325 asa takes on his own some days stated he will not take anymore as of 02-17-20 Last Dose:  Anesthesia review:   Patient denies shortness of breath, fever, cough and chest pain at PAT appointment  NONE   Patient verbalized understanding of instructions that were given to them at the PAT appointment. Patient was also instructed that they will need to review over the PAT instructions again at home before surgery.

## 2020-02-22 ENCOUNTER — Other Ambulatory Visit (HOSPITAL_COMMUNITY)
Admission: RE | Admit: 2020-02-22 | Discharge: 2020-02-22 | Disposition: A | Discharge status: Home / Self Care | Payer: Medicare PPO | Source: Ambulatory Visit | Attending: Surgery | Admitting: Surgery

## 2020-02-22 DIAGNOSIS — Z20822 Contact with and (suspected) exposure to covid-19: Secondary | ICD-10-CM | POA: Diagnosis not present

## 2020-02-22 DIAGNOSIS — Z01812 Encounter for preprocedural laboratory examination: Secondary | ICD-10-CM | POA: Insufficient documentation

## 2020-02-22 DIAGNOSIS — Z87442 Personal history of urinary calculi: Secondary | ICD-10-CM | POA: Diagnosis not present

## 2020-02-22 DIAGNOSIS — D351 Benign neoplasm of parathyroid gland: Secondary | ICD-10-CM | POA: Diagnosis not present

## 2020-02-22 DIAGNOSIS — K219 Gastro-esophageal reflux disease without esophagitis: Secondary | ICD-10-CM | POA: Diagnosis not present

## 2020-02-22 DIAGNOSIS — E041 Nontoxic single thyroid nodule: Secondary | ICD-10-CM | POA: Diagnosis not present

## 2020-02-22 DIAGNOSIS — N189 Chronic kidney disease, unspecified: Secondary | ICD-10-CM | POA: Diagnosis not present

## 2020-02-22 DIAGNOSIS — Z7982 Long term (current) use of aspirin: Secondary | ICD-10-CM | POA: Diagnosis not present

## 2020-02-22 DIAGNOSIS — I10 Essential (primary) hypertension: Secondary | ICD-10-CM | POA: Diagnosis not present

## 2020-02-22 DIAGNOSIS — E210 Primary hyperparathyroidism: Secondary | ICD-10-CM | POA: Diagnosis not present

## 2020-02-22 DIAGNOSIS — Z79899 Other long term (current) drug therapy: Secondary | ICD-10-CM | POA: Diagnosis not present

## 2020-02-22 DIAGNOSIS — C61 Malignant neoplasm of prostate: Secondary | ICD-10-CM | POA: Diagnosis not present

## 2020-02-22 LAB — SARS CORONAVIRUS 2 (TAT 6-24 HRS): SARS Coronavirus 2: NEGATIVE

## 2020-02-23 ENCOUNTER — Encounter (HOSPITAL_COMMUNITY): Payer: Self-pay | Admitting: Surgery

## 2020-02-23 DIAGNOSIS — E210 Primary hyperparathyroidism: Secondary | ICD-10-CM | POA: Diagnosis present

## 2020-02-23 NOTE — H&P (Signed)
[facility]  Matthew [name] DOB: September 14, 1941 Married / Language: English / Race: White Male   History of Present Illness   The patient is a 79 year old male who presents with primary hyperparathyroidism.  CHIEF COMPLAINT: primary vs secondary hyperparathyroidism  Patient is referred by Dr. Jamal Maes for surgical evaluation and management of hyperparathyroidism. Patient's primary care physician is Dr. Wenda Low. Patient's urologist is Dr. Irine Seal. Patient has long-standing chronic kidney disease. He has had a history of nephrolithiasis. He has been followed with laboratory studies for many years. He has had a slowly rising calcium level with his most recent level being elevated at 10.6. Likewise he has had a slowly rising PTH level with his most recent level elevated at 199. Patient had been taking calcitriol and that was discontinued by his nephrologist. Patient underwent a nuclear medicine parathyroid scan on September 17, 2019. This failed to identify any evidence of parathyroid adenoma. Patient has had no prior head or neck surgery other than tonsillectomy. He was treated in [place] with external beam radiation for adenoids and tonsils as a child. The patient has never been on thyroid medication. Patient presents today to discuss further options for evaluation and management of suspected parathyroid disease. It is not clear whether or not he has primary hyperparathyroidism or secondary hyperparathyroidism.   Past Surgical History  Cataract Surgery  Bilateral. Knee Surgery  Left. Prostate Surgery - Removal   Diagnostic Studies History  Colonoscopy  1-5 years ago  Allergies  No Known Drug Allergies  Medication History  Allopurinol (100MG  Tablet, Oral) Active. amLODIPine Besylate (10MG  Tablet, Oral) Active. Aspirin (81MG  Tablet, Oral) Active. Atorvastatin Calcium (20MG  Tablet, Oral) Active. Furosemide (80MG  Tablet,  Oral) Active. Metoprolol Succinate ER (100MG  Tablet ER 24HR, Oral) Active. NexIUM (20MG  Capsule DR, Oral) Active. Multivitamins (Oral) Active. Medications Reconciled  Social History Alcohol use  Moderate alcohol use. Caffeine use  Coffee. No drug use  Tobacco use  Never smoker.  Family History Cerebrovascular Accident  Daughter. Ischemic Bowel Disease  Son.  Other Problems  Gastroesophageal Reflux Disease  High blood pressure  Kidney Stone  Prostate Cancer    Review of Systems  General Not Present- Appetite Loss, Chills, Fatigue, Fever, Night Sweats, Weight Gain and Weight Loss. Skin Not Present- Change in Wart/Mole, Dryness, Hives, Jaundice, New Lesions, Non-Healing Wounds, Rash and Ulcer. HEENT Not Present- Earache, Hearing Loss, Hoarseness, Nose Bleed, Oral Ulcers, Ringing in the Ears, Seasonal Allergies, Sinus Pain, Sore Throat, Visual Disturbances, Wears glasses/contact lenses and Yellow Eyes. Respiratory Not Present- Bloody sputum, Chronic Cough, Difficulty Breathing, Snoring and Wheezing. Breast Not Present- Breast Mass, Breast Pain, Nipple Discharge and Skin Changes. Cardiovascular Not Present- Chest Pain, Difficulty Breathing Lying Down, Leg Cramps, Palpitations, Rapid Heart Rate, Shortness of Breath and Swelling of Extremities. Gastrointestinal Not Present- Abdominal Pain, Bloating, Bloody Stool, Change in Bowel Habits, Chronic diarrhea, Constipation, Difficulty Swallowing, Excessive gas, Gets full quickly at meals, Hemorrhoids, Indigestion, Nausea, Rectal Pain and Vomiting. Male Genitourinary Present- Urine Leakage. Not Present- Blood in Urine, Change in Urinary Stream, Frequency, Impotence, Nocturia, Painful Urination and Urgency. Musculoskeletal Not Present- Back Pain, Joint Pain, Joint Stiffness, Muscle Pain, Muscle Weakness and Swelling of Extremities. Neurological Not Present- Decreased Memory, Fainting, Headaches, Numbness, Seizures, Tingling, Tremor,  Trouble walking and Weakness. Psychiatric Not Present- Anxiety, Bipolar, Change in Sleep Pattern, Depression, Fearful and Frequent crying. Endocrine Not Present- Cold Intolerance, Excessive Hunger, Hair Changes, Heat Intolerance, Hot flashes and New Diabetes.  Hematology Not Present- Blood Thinners, Easy Bruising, Excessive bleeding, Gland problems, HIV and Persistent Infections.  Vitals  Weight: 236.38 lb Height: 72in Body Surface Area: 2.29 m Body Mass Index: 32.06 kg/m  Temp.: 98.55F  Pulse: 82 (Regular)  P.OX: 97% (Room air) BP: 128/86 (Sitting, Left Arm, Standard)  Physical Exam  GENERAL APPEARANCE Development: normal Nutritional status: normal Gross deformities: none  SKIN Rash, lesions, ulcers: none Induration, erythema: none Nodules: none palpable  EYES Conjunctiva and lids: normal Pupils: equal and reactive Iris: normal bilaterally  EARS, NOSE, MOUTH, THROAT External ears: no lesion or deformity External nose: no lesion or deformity Hearing: grossly normal Patient is wearing a mask.  NECK Symmetric: yes Trachea: midline Thyroid: no palpable nodules in the thyroid bed Limited range of motion,  CHEST Respiratory effort: normal Retraction or accessory muscle use: no Breath sounds: normal bilaterally Rales, rhonchi, wheeze: none  CARDIOVASCULAR Auscultation: regular rhythm, normal rate Murmurs: none Pulses: carotid and radial pulse 2+ palpable Lower extremity edema: moderate to severe, bilateral  MUSCULOSKELETAL Station and gait: normal Digits and nails: no clubbing or cyanosis Muscle strength: grossly normal all extremities Range of motion: grossly normal all extremities Deformity: none  LYMPHATIC Cervical: none palpable Supraclavicular: none palpable  PSYCHIATRIC Oriented to person, place, and time: yes Mood and affect: normal for situation Judgment and insight: appropriate for situation    Assessment & Plan  ELEVATED  PARATHYROID HORMONE (E34.9) HYPERCALCEMIA (E83.52) CHRONIC KIDNEY DISEASE (N18.9) HISTORY OF NEPHROLITHIASIS [id number])  Follow Up - Call [facility] office after tests / studies doneto discuss further plans  Patient is referred by Dr. Jamal Maes for surgical evaluation and recommendations regarding under lying parathyroid disease. Patient is provided with written literature on parathyroid surgery to review at home.  We reviewed the patient's clinical history and recent laboratory studies. While it is not clear whether or not this patient has primary hyperparathyroidism or secondary hyperparathyroidism, the biochemical evidence would lean towards primary disease. Patient has had a nuclear medicine parathyroid scan which was negative for adenoma. However, that test is only 80% successful at best. Therefore I have recommended proceeding with further diagnostic studies to include an ultrasound examination of the neck as well as a 4D CT scan with parathyroid protocol. We will order both of these studies to be done in the near future. I will contact the patient with the results when they are available. Hopefully they will lend more clarity to the clinical nature of the patient's parathyroid disease.  Pt Education - Pamphlet Given - The Parathyroid Surgery Book: discussed with patient and provided information.  ADDENDUM  Telephone call to patient with results of CT and USN showing bilateral adenomas. Will plan neck exploration with parathyroidectomy, possible multiple glands. Will plan overnight hospital stay. Patient understands and agrees with plans.  The risks and benefits of the procedure have been discussed at length with the patient. The patient understands the proposed procedure, potential alternative treatments, and the course of recovery to be expected. All of the patient's questions have been answered at this time. The patient wishes to proceed with surgery.  Armandina Gemma, [place] [facility] Office: [phone]

## 2020-02-25 ENCOUNTER — Ambulatory Visit (HOSPITAL_COMMUNITY): Payer: Medicare PPO | Admitting: Certified Registered"

## 2020-02-25 ENCOUNTER — Ambulatory Visit (HOSPITAL_COMMUNITY): Payer: Medicare PPO | Admitting: Physician Assistant

## 2020-02-25 ENCOUNTER — Encounter (HOSPITAL_COMMUNITY)
Admission: RE | Disposition: A | Discharge status: Home / Self Care | Payer: Self-pay | Source: Home / Self Care | Attending: Surgery

## 2020-02-25 ENCOUNTER — Ambulatory Visit (HOSPITAL_COMMUNITY)
Admission: RE | Admit: 2020-02-25 | Discharge: 2020-02-26 | Disposition: A | Discharge status: Home / Self Care | Payer: Medicare PPO | Attending: Surgery | Admitting: Surgery

## 2020-02-25 ENCOUNTER — Encounter (HOSPITAL_COMMUNITY): Payer: Self-pay | Admitting: Surgery

## 2020-02-25 ENCOUNTER — Other Ambulatory Visit: Payer: Self-pay

## 2020-02-25 DIAGNOSIS — K219 Gastro-esophageal reflux disease without esophagitis: Secondary | ICD-10-CM | POA: Diagnosis not present

## 2020-02-25 DIAGNOSIS — C61 Malignant neoplasm of prostate: Secondary | ICD-10-CM | POA: Insufficient documentation

## 2020-02-25 DIAGNOSIS — D351 Benign neoplasm of parathyroid gland: Secondary | ICD-10-CM | POA: Diagnosis not present

## 2020-02-25 DIAGNOSIS — Z20822 Contact with and (suspected) exposure to covid-19: Secondary | ICD-10-CM | POA: Diagnosis not present

## 2020-02-25 DIAGNOSIS — N189 Chronic kidney disease, unspecified: Secondary | ICD-10-CM | POA: Insufficient documentation

## 2020-02-25 DIAGNOSIS — I10 Essential (primary) hypertension: Secondary | ICD-10-CM | POA: Insufficient documentation

## 2020-02-25 DIAGNOSIS — E041 Nontoxic single thyroid nodule: Secondary | ICD-10-CM | POA: Insufficient documentation

## 2020-02-25 DIAGNOSIS — E210 Primary hyperparathyroidism: Secondary | ICD-10-CM | POA: Diagnosis present

## 2020-02-25 DIAGNOSIS — Z87442 Personal history of urinary calculi: Secondary | ICD-10-CM | POA: Insufficient documentation

## 2020-02-25 DIAGNOSIS — Z79899 Other long term (current) drug therapy: Secondary | ICD-10-CM | POA: Insufficient documentation

## 2020-02-25 DIAGNOSIS — Z7982 Long term (current) use of aspirin: Secondary | ICD-10-CM | POA: Insufficient documentation

## 2020-02-25 HISTORY — PX: PARATHYROIDECTOMY: SHX19

## 2020-02-25 SURGERY — PARATHYROIDECTOMY
Anesthesia: General | Site: Neck

## 2020-02-25 MED ORDER — ONDANSETRON HCL 4 MG/2ML IJ SOLN
INTRAMUSCULAR | Status: DC | PRN
  Administered 2020-02-25: 4 mg via INTRAVENOUS

## 2020-02-25 MED ORDER — SUGAMMADEX SODIUM 200 MG/2ML IV SOLN
INTRAVENOUS | Status: DC | PRN
  Administered 2020-02-25: 200 mg via INTRAVENOUS

## 2020-02-25 MED ORDER — ONDANSETRON HCL 4 MG/2ML IJ SOLN: 4.0000 mg | Freq: Four times a day (QID) | INTRAMUSCULAR | Status: DC | PRN

## 2020-02-25 MED ORDER — ALLOPURINOL 300 MG PO TABS
300.0000 mg | ORAL_TABLET | Freq: Every day | ORAL | Status: DC
  Administered 2020-02-26: 300 mg via ORAL
  Filled 2020-02-25: qty 1

## 2020-02-25 MED ORDER — ACETAMINOPHEN 650 MG RE SUPP: 650.0000 mg | Freq: Four times a day (QID) | RECTAL | Status: DC | PRN

## 2020-02-25 MED ORDER — OXYCODONE HCL 5 MG/5ML PO SOLN: 5.0000 mg | Freq: Once | ORAL | Status: DC | PRN

## 2020-02-25 MED ORDER — ONDANSETRON HCL 4 MG/2ML IJ SOLN
INTRAMUSCULAR | Status: AC
  Filled 2020-02-25: qty 2

## 2020-02-25 MED ORDER — METOPROLOL SUCCINATE ER 100 MG PO TB24
100.0000 mg | ORAL_TABLET | Freq: Every day | ORAL | Status: DC
  Administered 2020-02-26: 100 mg via ORAL
  Filled 2020-02-25: qty 1

## 2020-02-25 MED ORDER — TRAMADOL HCL 50 MG PO TABS: 50.0000 mg | ORAL_TABLET | Freq: Four times a day (QID) | ORAL | Status: DC | PRN

## 2020-02-25 MED ORDER — PROMETHAZINE HCL 25 MG/ML IJ SOLN: 6.2500 mg | INTRAMUSCULAR | Status: DC | PRN

## 2020-02-25 MED ORDER — LIDOCAINE 2% (20 MG/ML) 5 ML SYRINGE
INTRAMUSCULAR | Status: AC
  Filled 2020-02-25: qty 5

## 2020-02-25 MED ORDER — BUPIVACAINE HCL (PF) 0.25 % IJ SOLN
INTRAMUSCULAR | Status: AC
  Filled 2020-02-25: qty 30

## 2020-02-25 MED ORDER — ACETAMINOPHEN 325 MG PO TABS: 650.0000 mg | ORAL_TABLET | Freq: Four times a day (QID) | ORAL | Status: DC | PRN

## 2020-02-25 MED ORDER — DEXAMETHASONE SODIUM PHOSPHATE 10 MG/ML IJ SOLN
INTRAMUSCULAR | Status: DC | PRN
  Administered 2020-02-25: 4 mg via INTRAVENOUS

## 2020-02-25 MED ORDER — FENTANYL CITRATE (PF) 100 MCG/2ML IJ SOLN
INTRAMUSCULAR | Status: AC
  Filled 2020-02-25: qty 2

## 2020-02-25 MED ORDER — HYDROMORPHONE HCL 1 MG/ML IJ SOLN: 1.0000 mg | INTRAMUSCULAR | Status: DC | PRN

## 2020-02-25 MED ORDER — PROPOFOL 10 MG/ML IV BOLUS
INTRAVENOUS | Status: DC | PRN
  Administered 2020-02-25: 160 mg via INTRAVENOUS

## 2020-02-25 MED ORDER — CHLORHEXIDINE GLUCONATE CLOTH 2 % EX PADS: 6.0000 | MEDICATED_PAD | Freq: Once | CUTANEOUS | Status: DC

## 2020-02-25 MED ORDER — ROCURONIUM BROMIDE 10 MG/ML (PF) SYRINGE
PREFILLED_SYRINGE | INTRAVENOUS | Status: AC
  Filled 2020-02-25: qty 10

## 2020-02-25 MED ORDER — FENTANYL CITRATE (PF) 250 MCG/5ML IJ SOLN
INTRAMUSCULAR | Status: DC | PRN
  Administered 2020-02-25: 25 ug via INTRAVENOUS
  Administered 2020-02-25: 50 ug via INTRAVENOUS
  Administered 2020-02-25: 25 ug via INTRAVENOUS
  Administered 2020-02-25 (×2): 50 ug via INTRAVENOUS

## 2020-02-25 MED ORDER — DEXAMETHASONE SODIUM PHOSPHATE 10 MG/ML IJ SOLN
INTRAMUSCULAR | Status: AC
  Filled 2020-02-25: qty 1

## 2020-02-25 MED ORDER — ROCURONIUM BROMIDE 10 MG/ML (PF) SYRINGE
PREFILLED_SYRINGE | INTRAVENOUS | Status: DC | PRN
  Administered 2020-02-25: 60 mg via INTRAVENOUS
  Administered 2020-02-25: 10 mg via INTRAVENOUS

## 2020-02-25 MED ORDER — CEFAZOLIN SODIUM-DEXTROSE 2-4 GM/100ML-% IV SOLN
2.0000 g | INTRAVENOUS | Status: AC
  Administered 2020-02-25: 2 g via INTRAVENOUS

## 2020-02-25 MED ORDER — LIDOCAINE 2% (20 MG/ML) 5 ML SYRINGE
INTRAMUSCULAR | Status: DC | PRN
  Administered 2020-02-25: 40 mg via INTRAVENOUS

## 2020-02-25 MED ORDER — OXYCODONE HCL 5 MG PO TABS: 5.0000 mg | ORAL_TABLET | Freq: Once | ORAL | Status: DC | PRN

## 2020-02-25 MED ORDER — 0.9 % SODIUM CHLORIDE (POUR BTL) OPTIME
TOPICAL | Status: DC | PRN
  Administered 2020-02-25: 1000 mL

## 2020-02-25 MED ORDER — ONDANSETRON 4 MG PO TBDP: 4.0000 mg | ORAL_TABLET | Freq: Four times a day (QID) | ORAL | Status: DC | PRN

## 2020-02-25 MED ORDER — PROPOFOL 10 MG/ML IV BOLUS
INTRAVENOUS | Status: AC
  Filled 2020-02-25: qty 20

## 2020-02-25 MED ORDER — KCL IN DEXTROSE-NACL 20-5-0.45 MEQ/L-%-% IV SOLN
INTRAVENOUS | Status: DC
  Filled 2020-02-25: qty 1000

## 2020-02-25 MED ORDER — FUROSEMIDE 40 MG PO TABS
80.0000 mg | ORAL_TABLET | Freq: Two times a day (BID) | ORAL | Status: DC
  Administered 2020-02-25 – 2020-02-26 (×2): 80 mg via ORAL
  Filled 2020-02-25 (×2): qty 2

## 2020-02-25 MED ORDER — OXYCODONE HCL 5 MG PO TABS
5.0000 mg | ORAL_TABLET | ORAL | Status: DC | PRN
  Administered 2020-02-25 (×2): 10 mg via ORAL
  Filled 2020-02-25 (×2): qty 2

## 2020-02-25 MED ORDER — AMLODIPINE BESYLATE 10 MG PO TABS
10.0000 mg | ORAL_TABLET | Freq: Every day | ORAL | Status: DC
  Administered 2020-02-26: 10 mg via ORAL
  Filled 2020-02-25: qty 1

## 2020-02-25 MED ORDER — HYDROMORPHONE HCL 1 MG/ML IJ SOLN
INTRAMUSCULAR | Status: AC
  Filled 2020-02-25: qty 1

## 2020-02-25 MED ORDER — HYDROMORPHONE HCL 1 MG/ML IJ SOLN
0.2500 mg | INTRAMUSCULAR | Status: DC | PRN
  Administered 2020-02-25 (×2): 0.5 mg via INTRAVENOUS

## 2020-02-25 MED ORDER — PHENYLEPHRINE 40 MCG/ML (10ML) SYRINGE FOR IV PUSH (FOR BLOOD PRESSURE SUPPORT)
PREFILLED_SYRINGE | INTRAVENOUS | Status: DC | PRN
  Administered 2020-02-25: 80 ug via INTRAVENOUS
  Administered 2020-02-25: 40 ug via INTRAVENOUS
  Administered 2020-02-25: 80 ug via INTRAVENOUS
  Administered 2020-02-25: 40 ug via INTRAVENOUS

## 2020-02-25 MED ORDER — LACTATED RINGERS IV SOLN: INTRAVENOUS | Status: DC

## 2020-02-25 MED ORDER — CEFAZOLIN SODIUM-DEXTROSE 2-4 GM/100ML-% IV SOLN
INTRAVENOUS | Status: AC
  Filled 2020-02-25: qty 100

## 2020-02-25 SURGICAL SUPPLY — 33 items
ADH SKN CLS APL DERMABOND .7 (GAUZE/BANDAGES/DRESSINGS) ×1
APL PRP STRL LF DISP 70% ISPRP (MISCELLANEOUS) ×1
ATTRACTOMAT 16X20 MAGNETIC DRP (DRAPES) ×3 IMPLANT
BLADE SURG 15 STRL LF DISP TIS (BLADE) ×1 IMPLANT
BLADE SURG 15 STRL SS (BLADE) ×3
CHLORAPREP W/TINT 26 (MISCELLANEOUS) ×3 IMPLANT
CLIP VESOCCLUDE MED 6/CT (CLIP) ×6 IMPLANT
CLIP VESOCCLUDE SM WIDE 6/CT (CLIP) ×6 IMPLANT
COVER SURGICAL LIGHT HANDLE (MISCELLANEOUS) ×3 IMPLANT
COVER WAND RF STERILE (DRAPES) ×3 IMPLANT
DERMABOND ADVANCED (GAUZE/BANDAGES/DRESSINGS) ×2
DERMABOND ADVANCED .7 DNX12 (GAUZE/BANDAGES/DRESSINGS) ×1 IMPLANT
DRAPE LAPAROTOMY T 98X78 PEDS (DRAPES) ×3 IMPLANT
ELECT REM PT RETURN 15FT ADLT (MISCELLANEOUS) ×3 IMPLANT
GAUZE 4X4 16PLY RFD (DISPOSABLE) ×3 IMPLANT
GLOVE SURG ORTHO 8.0 STRL STRW (GLOVE) ×3 IMPLANT
GOWN STRL REUS W/TWL XL LVL3 (GOWN DISPOSABLE) ×9 IMPLANT
HEMOSTAT SURGICEL 2X4 FIBR (HEMOSTASIS) ×3 IMPLANT
ILLUMINATOR WAVEGUIDE N/F (MISCELLANEOUS) IMPLANT
KIT BASIN OR (CUSTOM PROCEDURE TRAY) ×3 IMPLANT
KIT TURNOVER KIT A (KITS) IMPLANT
NDL HYPO 25X1 1.5 SAFETY (NEEDLE) ×1 IMPLANT
NEEDLE HYPO 25X1 1.5 SAFETY (NEEDLE) ×3 IMPLANT
PACK BASIC VI WITH GOWN DISP (CUSTOM PROCEDURE TRAY) ×3 IMPLANT
PENCIL SMOKE EVACUATOR (MISCELLANEOUS) ×3 IMPLANT
SUT MNCRL AB 4-0 PS2 18 (SUTURE) ×3 IMPLANT
SUT VIC AB 3-0 SH 18 (SUTURE) ×3 IMPLANT
SYR BULB IRRIGATION 50ML (SYRINGE) ×3 IMPLANT
SYR CONTROL 10ML LL (SYRINGE) ×3 IMPLANT
TOWEL OR 17X26 10 PK STRL BLUE (TOWEL DISPOSABLE) ×3 IMPLANT
TOWEL OR NON WOVEN STRL DISP B (DISPOSABLE) ×3 IMPLANT
TUBING CONNECTING 10 (TUBING) ×2 IMPLANT
TUBING CONNECTING 10' (TUBING) ×1

## 2020-02-25 NOTE — Anesthesia Postprocedure Evaluation (Signed)
Anesthesia Post Note  Patient: MENZO BRAAM  Procedure(s) Performed: NECK EXPLORATION WITH PARATHYROIDECTOMY (N/A Neck)     Patient location during evaluation: PACU Anesthesia Type: General Level of consciousness: awake and alert Pain management: pain level controlled Vital Signs Assessment: post-procedure vital signs reviewed and stable Respiratory status: spontaneous breathing, nonlabored ventilation and respiratory function stable Cardiovascular status: blood pressure returned to baseline and stable Postop Assessment: no apparent nausea or vomiting Anesthetic complications: no    Last Vitals:  Vitals:   02/25/20 1328 02/25/20 1420  BP: (!) 148/92 (!) 145/96  Pulse: 70 72  Resp: 16 18  Temp: 36.6 C (!) 36.4 C  SpO2: 98% 100%    Last Pain:  Vitals:   02/25/20 1420  TempSrc: Oral  PainSc:                  Lynda [place]

## 2020-02-25 NOTE — Anesthesia Preprocedure Evaluation (Signed)
Anesthesia Evaluation  Patient identified by MRN, date of birth, ID band Patient awake    Reviewed: Allergy & Precautions, NPO status , Patient's Chart, lab work & pertinent test results, reviewed documented beta blocker date and time   History of Anesthesia Complications Negative for: history of anesthetic complications  Airway Mallampati: II  TM Distance: >3 FB Neck ROM: Full    Dental  (+) Teeth Intact,    Pulmonary neg pulmonary ROS,    breath sounds clear to auscultation       Cardiovascular hypertension, Pt. on medications and Pt. on home beta blockers (-) angina(-) Past MI and (-) CHF  Rhythm:Regular     Neuro/Psych negative neurological ROS  negative psych ROS   GI/Hepatic Neg liver ROS, GERD  Medicated and Controlled,  Endo/Other  negative endocrine ROS  Renal/GU negative Renal ROS     Musculoskeletal   Abdominal   Peds  Hematology negative hematology ROS (+)   Anesthesia Other Findings   Reproductive/Obstetrics                             Anesthesia Physical  Anesthesia Plan  ASA: II  Anesthesia Plan: General   Post-op Pain Management:    Induction: Intravenous  PONV Risk Score and Plan: 2 and Ondansetron, Midazolam and Treatment may vary due to age or medical condition  Airway Management Planned: Oral ETT  Additional Equipment:   Intra-op Plan:   Post-operative Plan: Extubation in OR  Informed Consent: I have reviewed the patients History and Physical, chart, labs and discussed the procedure including the risks, benefits and alternatives for the proposed anesthesia with the patient or authorized representative who has indicated his/her understanding and acceptance.     Dental advisory given  Plan Discussed with: CRNA and Surgeon  Anesthesia Plan Comments:         Anesthesia Quick Evaluation

## 2020-02-25 NOTE — Op Note (Signed)
Operative Note  Pre-operative Diagnosis:  Primary hyperparathyroidism  Post-operative Diagnosis:  same  Surgeon:  Armandina Gemma, MD  Assistant:  none   Procedure:  Neck exploration, right parathyroidectomy, left parathyroidectomy, excision of thyroid nodule  Anesthesia:  general  Estimated Blood Loss:  minimal  Drains: none         Specimen: to pathology - Dr. Claudette Laws  Indications:  Patient is referred by Dr. Jamal Maes for surgical evaluation and management of hyperparathyroidism. Patient's primary care physician is Dr. Wenda Low. Patient's urologist is Dr. Irine Seal. Patient has long-standing chronic kidney disease. He has had a history of nephrolithiasis. He has been followed with laboratory studies for many years. He has had a slowly rising calcium level with his most recent level being elevated at 10.6. Likewise he has had a slowly rising PTH level with his most recent level elevated at 199. Patient had been taking calcitriol and that was discontinued by his nephrologist. Patient underwent a nuclear medicine parathyroid scan on September 17, 2019. This failed to identify any evidence of parathyroid adenoma. Patient has had no prior head or neck surgery other than tonsillectomy. He was treated in [place] with external beam radiation for adenoids and tonsils as a child. The patient has never been on thyroid medication. Patient presents today to discuss further options for evaluation and management of suspected parathyroid disease. It is not clear whether or not he has primary hyperparathyroidism or secondary hyperparathyroidism.  The results of CT and USN show bilateral adenomas. Will plan neck exploration with parathyroidectomy, possible multiple glands.   Procedure:  The patient was seen in the pre-op holding area. The risks, benefits, complications, treatment options, and expected outcomes were previously discussed with the patient. The patient agreed with the  proposed plan and has signed the informed consent form.  The patient was brought to the operating room by the surgical team, identified as Alesia Morin and the procedure verified. A "time out" was completed and the above information confirmed.  Following induction of general anesthesia, the patient is positioned and then prepped and draped in the usual aseptic fashion.  After ascertaining that an adequate level of anesthesia been achieved, a Kocher incision is made with a #15 blade.  Dissection is carried through subcutaneous tissues and platysma.  Skin flaps are elevated cephalad and caudad.  A Mahorner self-retaining retractors placed for exposure.  Strap muscles are incised in the midline and dissection is begun on the left side.  Left thyroid lobe was mobilized anteriorly.  Exploration reveals a markedly enlarged parathyroid gland in the tracheoesophageal groove at approximately the mid level of the left thyroid lobe.  This is gently dissected out and the vascular pedicles divided between small ligaclips.  The entire gland is excised.  This is most likely a left superior parathyroid gland.  Frozen section by Dr. Claudette Laws confirms parathyroid tissue consistent with parathyroid adenoma.  This does not appear to be hyperplasia.  Dry pack is placed in the left neck.  Next the right thyroid lobe is mobilized.  Again the neck is explored laterally and posteriorly.  There is again an enlarged parathyroid gland in the tracheoesophageal groove at approximately the mid level of the right thyroid lobe.  This is gently dissected out vascular structures are divided between small ligaclips.  It is difficult to say whether or not this is a superior or inferior gland based on vascular anatomy.  The gland is submitted to pathology where frozen section by Dr.  Claudette Laws again confirms parathyroid tissue consistent with adenoma.  Further exploration on the right reveals a small nodular mass on the inferior portion of  the thyroid lobe.  This measures less than 1 cm in size but is relatively firm.  It is dissected out and excised in its entirety.  Frozen section confirmed thyroid tissue.  Good hemostasis is obtained throughout the operative field.  Neck is irrigated with warm saline.  Fibrillar is placed throughout the operative field.  Strap muscles are closed in the midline with interrupted 3-0 Vicryl sutures.  Platysma was closed with interrupted 3-0 Vicryl sutures.  Skin is closed with a running 4-0 Monocryl subcuticular suture.  Wound is washed and dried and Dermabond is applied as dressing.  Patient is awakened from anesthesia and transported to the recovery room.  The patient tolerated the procedure well.   Armandina Gemma, MD [facility] Office: [phone]

## 2020-02-25 NOTE — Transfer of Care (Signed)
Immediate Anesthesia Transfer of Care Note  Patient: Matthew [name]  Procedure(s) Performed: NECK EXPLORATION WITH PARATHYROIDECTOMY (N/A Neck)  Patient Location: PACU  Anesthesia Type:General  Level of Consciousness: awake, alert  and oriented  Airway & Oxygen Therapy: Patient Spontanous Breathing and Patient connected to face mask oxygen  Post-op Assessment: Report given to RN and Post -op Vital signs reviewed and stable  Post vital signs: Reviewed and stable  Last Vitals:  Vitals Value Taken Time  BP 149/95 02/25/20 1233  Temp 36.4 C 02/25/20 1233  Pulse 73 02/25/20 1236  Resp 17 02/25/20 1236  SpO2 100 % 02/25/20 1236  Vitals shown include unvalidated device data.  Last Pain:  Vitals:   02/25/20 1233  TempSrc:   PainSc: Asleep      Patients Stated Pain Goal: 4 ([id number])  Complications: No apparent anesthesia complications

## 2020-02-25 NOTE — Anesthesia Procedure Notes (Signed)
Procedure Name: Intubation Date/Time: 02/25/2020 11:12 AM Performed by: Eben Burow, CRNA Pre-anesthesia Checklist: Patient identified, Emergency Drugs available, Suction available, Patient being monitored and Timeout performed Patient Re-evaluated:Patient Re-evaluated prior to induction Oxygen Delivery Method: Circle system utilized Preoxygenation: Pre-oxygenation with 100% oxygen Induction Type: IV induction Ventilation: Mask ventilation without difficulty Laryngoscope Size: Mac and 4 Grade View: Grade II Tube type: Oral Tube size: 7.5 mm Number of attempts: 1 Airway Equipment and Method: Stylet Placement Confirmation: ETT inserted through vocal cords under direct vision,  positive ETCO2 and breath sounds checked- equal and bilateral Secured at: 22 cm Tube secured with: Tape Dental Injury: Teeth and Oropharynx as per pre-operative assessment

## 2020-02-25 NOTE — Interval H&P Note (Signed)
History and Physical Interval Note:  02/25/2020 10:43 AM  Matthew [name]  has presented today for surgery, with the diagnosis of PRIMARY HYPERPARATHYROIDISM.  The various methods of treatment have been discussed with the patient and family. After consideration of risks, benefits and other options for treatment, the patient has consented to    Procedure(s): NECK EXPLORATION WITH PARATHYROIDECTOMY (N/A) as a surgical intervention.    The patient's history has been reviewed, patient examined, no change in status, stable for surgery.  I have reviewed the patient's chart and labs.  Questions were answered to the patient's satisfaction.    Armandina Gemma, MD [facility] Office: [place]

## 2020-02-26 DIAGNOSIS — E041 Nontoxic single thyroid nodule: Secondary | ICD-10-CM | POA: Diagnosis not present

## 2020-02-26 DIAGNOSIS — D351 Benign neoplasm of parathyroid gland: Secondary | ICD-10-CM | POA: Diagnosis not present

## 2020-02-26 DIAGNOSIS — Z20822 Contact with and (suspected) exposure to covid-19: Secondary | ICD-10-CM | POA: Diagnosis not present

## 2020-02-26 DIAGNOSIS — N189 Chronic kidney disease, unspecified: Secondary | ICD-10-CM | POA: Diagnosis not present

## 2020-02-26 DIAGNOSIS — Z87442 Personal history of urinary calculi: Secondary | ICD-10-CM | POA: Diagnosis not present

## 2020-02-26 DIAGNOSIS — K219 Gastro-esophageal reflux disease without esophagitis: Secondary | ICD-10-CM | POA: Diagnosis not present

## 2020-02-26 DIAGNOSIS — C61 Malignant neoplasm of prostate: Secondary | ICD-10-CM | POA: Diagnosis not present

## 2020-02-26 DIAGNOSIS — I10 Essential (primary) hypertension: Secondary | ICD-10-CM | POA: Diagnosis not present

## 2020-02-26 DIAGNOSIS — E210 Primary hyperparathyroidism: Secondary | ICD-10-CM | POA: Diagnosis not present

## 2020-02-26 LAB — BASIC METABOLIC PANEL
Anion gap: 8 (ref 5–15)
BUN: 27 mg/dL — ABNORMAL HIGH (ref 8–23)
CO2: 26 mmol/L (ref 22–32)
Calcium: 8.5 mg/dL — ABNORMAL LOW (ref 8.9–10.3)
Chloride: 105 mmol/L (ref 98–111)
Creatinine, Ser: 1.49 mg/dL — ABNORMAL HIGH (ref 0.61–1.24)
GFR calc Af Amer: 52 mL/min — ABNORMAL LOW (ref >60)
GFR calc non Af Amer: 45 mL/min — ABNORMAL LOW (ref >60)
Glucose, Bld: 145 mg/dL — ABNORMAL HIGH (ref 70–99)
Potassium: 3.8 mmol/L (ref 3.5–5.1)
Sodium: 139 mmol/L (ref 135–145)

## 2020-02-26 LAB — SURGICAL PATHOLOGY

## 2020-02-26 MED ORDER — TRAMADOL HCL 50 MG PO TABS: 50.0000 mg | ORAL_TABLET | Freq: Four times a day (QID) | ORAL | 0 refills | Status: DC | PRN

## 2020-02-26 NOTE — Discharge Instructions (Signed)
[facility]  THYROID & PARATHYROID SURGERY:  POST-OP INSTRUCTIONS  Always review your discharge instruction sheet from the facility where your surgery was performed.  A prescription for pain medication may be given to you upon discharge.  Take your pain medication as prescribed.  If narcotic pain medicine is not needed, then you may take acetaminophen (Tylenol) or ibuprofen (Advil) as needed.  Take your usually prescribed medications unless otherwise directed.  If you need a refill on your pain medication, please contact our office during regular business hours.  Prescriptions cannot be processed by our office after 5 pm or on weekends.  Start with a light diet upon arrival home, such as soup and crackers or toast.  Be sure to drink plenty of fluids daily.  Resume your normal diet the day after surgery.  Most patients will experience some swelling and bruising on the chest and neck area.  Ice packs will help.  Swelling and bruising can take several days to resolve.   It is common to experience some constipation after surgery.  Increasing fluid intake and taking a stool softener (Colace) will usually help or prevent this problem.  A mild laxative (Milk of Magnesia or Miralax) should be taken according to package directions if there has been no bowel movement after 48 hours.  You have steri-strips and a gauze dressing over your incision.  You may remove the gauze bandage on the second day after surgery, and you may shower at that time.  Leave your steri-strips (small skin tapes) in place directly over the incision.  These strips should remain on the skin for 5-7 days and then be removed.  You may get them wet in the shower and pat them dry.  You may resume regular (light) daily activities beginning the next day (such as daily self-care, walking, climbing stairs) gradually increasing activities as tolerated.  You may have sexual intercourse when it is comfortable.  Refrain from  any heavy lifting or straining until approved by your doctor.  You may drive when you no longer are taking prescription pain medication, you can comfortably wear a seatbelt, and you can safely maneuver your car and apply brakes.  You should see your doctor in the office for a follow-up appointment approximately three weeks after your surgery.  Make sure that you call for this appointment within a day or two after you arrive home to insure a convenient appointment time.  WHEN TO CALL YOUR DOCTOR: -- Fever greater than 101.5 -- Inability to urinate -- Nausea and/or vomiting - persistent -- Extreme swelling or bruising -- Continued bleeding from incision -- Increased pain, redness, or drainage from the incision -- Difficulty swallowing or breathing -- Muscle cramping or spasms -- Numbness or tingling in hands or around lips  The clinic staff is available to answer your questions during regular business hours.  Please don't hesitate to call and ask to speak to one of the nurses if you have concerns.  Aby Gessel, MD [facility] Office: [phone] 

## 2020-02-26 NOTE — Discharge Summary (Signed)
Physician Discharge Summary [facility]  Patient ID: Matthew [name] MRN: [id number] DOB/AGE: 07-27-1941 79 y.o.  Admit date: 02/25/2020 Discharge date: 02/26/2020  Admission Diagnoses:  Primary hyperparathyroidism  Discharge Diagnoses:  Principal Problem:   Hyperparathyroidism, primary ([place]) Active Problems:   Primary hyperparathyroidism [facility])   Discharged Condition: good  Hospital Course: Patient was admitted for observation following parathyroid surgery.  Post op course was uncomplicated.  Pain was well controlled.  Tolerated diet.  Post op calcium level on morning following surgery was 8.5 mg/dl.  Patient was prepared for discharge home on POD#1.  Consults: None  Treatments: surgery: neck exploration and parathyroidectomy (2 glands)  Discharge Exam: Blood pressure 128/79, pulse 78, temperature (!) 97.5 F (36.4 C), temperature source Oral, resp. rate 18, height 6' (1.829 m), weight 106.1 kg, SpO2 97 %. HEENT - clear Neck - wound dry and intact; mild STS; voice normal Chest - clear bilaterally Cor - RRR  Disposition: Home  Discharge Instructions    Diet - low sodium heart healthy   Complete by: As directed    Discharge instructions   Complete by: As directed    [facility]  THYROID & PARATHYROID SURGERY:  POST-OP INSTRUCTIONS  Always review your discharge instruction sheet from the facility where your surgery was performed.  A prescription for pain medication may be given to you upon discharge.  Take your pain medication as prescribed.  If narcotic pain medicine is not needed, then you may take acetaminophen (Tylenol) or ibuprofen (Advil) as needed.  Take your usually prescribed medications unless otherwise directed.  If you need a refill on your pain medication, please contact our office during regular business hours.  Prescriptions cannot be processed by our office after 5 pm or on weekends.  Start with a light diet upon  arrival home, such as soup and crackers or toast.  Be sure to drink plenty of fluids daily.  Resume your normal diet the day after surgery.  Most patients will experience some swelling and bruising on the chest and neck area.  Ice packs will help.  Swelling and bruising can take several days to resolve.   It is common to experience some constipation after surgery.  Increasing fluid intake and taking a stool softener (Colace) will usually help or prevent this problem.  A mild laxative (Milk of Magnesia or Miralax) should be taken according to package directions if there has been no bowel movement after 48 hours.  You have steri-strips and a gauze dressing over your incision.  You may remove the gauze bandage on the second day after surgery, and you may shower at that time.  Leave your steri-strips (small skin tapes) in place directly over the incision.  These strips should remain on the skin for 5-7 days and then be removed.  You may get them wet in the shower and pat them dry.  You may resume regular (light) daily activities beginning the next day (such as daily self-care, walking, climbing stairs) gradually increasing activities as tolerated.  You may have sexual intercourse when it is comfortable.  Refrain from any heavy lifting or straining until approved by your doctor.  You may drive when you no longer are taking prescription pain medication, you can comfortably wear a seatbelt, and you can safely maneuver your car and apply brakes.  You should see your doctor in the office for a follow-up appointment approximately three weeks after your surgery.  Make sure that you call  for this appointment within a day or two after you arrive home to insure a convenient appointment time.  WHEN TO CALL YOUR DOCTOR: -- Fever greater than 101.5 -- Inability to urinate -- Nausea and/or vomiting - persistent -- Extreme swelling or bruising -- Continued bleeding from incision -- Increased pain, redness, or  drainage from the incision -- Difficulty swallowing or breathing -- Muscle cramping or spasms -- Numbness or tingling in hands or around lips  The clinic staff is available to answer your questions during regular business hours.  Please don't hesitate to call and ask to speak to one of the nurses if you have concerns.  Armandina Gemma, MD [facility] Office: [phone]   Ice pack   Complete by: As directed    Increase activity slowly   Complete by: As directed    No dressing needed   Complete by: As directed      Allergies as of 02/26/2020   No Known Allergies     Medication List    TAKE these medications   allopurinol 300 MG tablet Commonly known as: ZYLOPRIM Take 300 mg by mouth daily.   amLODipine 10 MG tablet Commonly known as: NORVASC Take 10 mg by mouth daily.   aspirin 325 MG tablet Take 325 mg by mouth daily.   atorvastatin 20 MG tablet Commonly known as: LIPITOR Take 20 mg by mouth daily.   esomeprazole 20 MG capsule Commonly known as: NEXIUM Take 20 mg by mouth daily.   furosemide 80 MG tablet Commonly known as: LASIX Take 80 mg by mouth 2 (two) times daily.   metoprolol succinate 100 MG 24 hr tablet Commonly known as: TOPROL-XL Take 100 mg by mouth daily. Take with or immediately following a meal.   traMADol 50 MG tablet Commonly known as: ULTRAM Take 1-2 tablets (50-100 mg total) by mouth every 6 (six) hours as needed.      Follow-up Information    Armandina Gemma, MD. Schedule an appointment as soon as possible for a visit in 3 week(s).   Specialty: General Surgery Contact information: [address] [place] [address] [phone]           Earnstine Regal, MD, [facility] Office: [phone]   Signed: Armandina Gemma 02/26/2020, 7:48 AM

## 2020-03-07 ENCOUNTER — Ambulatory Visit: Payer: Medicare PPO | Attending: Internal Medicine

## 2020-03-07 DIAGNOSIS — Z23 Encounter for immunization: Secondary | ICD-10-CM

## 2020-03-07 NOTE — Progress Notes (Signed)
   Covid-19 Vaccination Clinic  Name:  Matthew [name]    MRN: [id number] DOB: March 22, 1941  03/07/2020  Matthew [name] was observed post Covid-19 immunization for 15 minutes without incident. He was provided with Vaccine Information Sheet and instruction to access the V-Safe system.   Matthew [name] was instructed to call 911 with any severe reactions post vaccine: Marland Kitchen Difficulty breathing  . Swelling of face and throat  . A fast heartbeat  . A bad rash all over body  . Dizziness and weakness   Immunizations Administered    Name Date Dose VIS Date Route   Pfizer COVID-19 Vaccine 03/07/2020  2:31 PM 0.3 mL 11/13/2019 Intramuscular   Manufacturer: [place]   Lot: [id number]   [place]: [id number]

## 2020-04-12 DIAGNOSIS — E892 Postprocedural hypoparathyroidism: Secondary | ICD-10-CM | POA: Diagnosis not present

## 2020-04-12 DIAGNOSIS — E349 Endocrine disorder, unspecified: Secondary | ICD-10-CM | POA: Diagnosis not present

## 2020-10-08 DIAGNOSIS — Z23 Encounter for immunization: Secondary | ICD-10-CM | POA: Diagnosis not present

## 2020-10-15 ENCOUNTER — Ambulatory Visit: Payer: Medicare PPO | Attending: Internal Medicine

## 2020-10-15 DIAGNOSIS — Z23 Encounter for immunization: Secondary | ICD-10-CM

## 2020-10-15 NOTE — Progress Notes (Signed)
   Covid-19 Vaccination Clinic  Name:  Matthew [name]    MRN: [id number] DOB: July 04, 1941  10/15/2020  Mr. Nusbaum was observed post Covid-19 immunization for 15 minutes without incident. He was provided with Vaccine Information Sheet and instruction to access the V-Safe system.   Mr. Heffington was instructed to call 911 with any severe reactions post vaccine: Marland Kitchen Difficulty breathing  . Swelling of face and throat  . A fast heartbeat  . A bad rash all over body  . Dizziness and weakness   Immunizations Administered    Name Date Dose VIS Date Route   Pfizer COVID-19 Vaccine 10/15/2020 11:24 AM 0.3 mL 09/21/2020 Intramuscular   Manufacturer: [place]   Lot: [id number]   [place]: [id number]

## 2020-10-20 DIAGNOSIS — D351 Benign neoplasm of parathyroid gland: Secondary | ICD-10-CM | POA: Diagnosis not present

## 2020-10-20 DIAGNOSIS — Z8546 Personal history of malignant neoplasm of prostate: Secondary | ICD-10-CM | POA: Diagnosis not present

## 2020-10-20 DIAGNOSIS — I129 Hypertensive chronic kidney disease with stage 1 through stage 4 chronic kidney disease, or unspecified chronic kidney disease: Secondary | ICD-10-CM | POA: Diagnosis not present

## 2020-10-20 DIAGNOSIS — N1832 Chronic kidney disease, stage 3b: Secondary | ICD-10-CM | POA: Diagnosis not present

## 2020-10-20 DIAGNOSIS — N183 Chronic kidney disease, stage 3 unspecified: Secondary | ICD-10-CM | POA: Diagnosis not present

## 2020-10-20 DIAGNOSIS — N200 Calculus of kidney: Secondary | ICD-10-CM | POA: Diagnosis not present

## 2020-11-01 DIAGNOSIS — R7309 Other abnormal glucose: Secondary | ICD-10-CM | POA: Diagnosis not present

## 2020-11-01 DIAGNOSIS — E7800 Pure hypercholesterolemia, unspecified: Secondary | ICD-10-CM | POA: Diagnosis not present

## 2020-11-01 DIAGNOSIS — Z1389 Encounter for screening for other disorder: Secondary | ICD-10-CM | POA: Diagnosis not present

## 2020-11-01 DIAGNOSIS — N1831 Chronic kidney disease, stage 3a: Secondary | ICD-10-CM | POA: Diagnosis not present

## 2020-11-01 DIAGNOSIS — K2270 Barrett's esophagus without dysplasia: Secondary | ICD-10-CM | POA: Diagnosis not present

## 2020-11-01 DIAGNOSIS — I10 Essential (primary) hypertension: Secondary | ICD-10-CM | POA: Diagnosis not present

## 2020-11-01 DIAGNOSIS — C61 Malignant neoplasm of prostate: Secondary | ICD-10-CM | POA: Diagnosis not present

## 2020-11-01 DIAGNOSIS — N2581 Secondary hyperparathyroidism of renal origin: Secondary | ICD-10-CM | POA: Diagnosis not present

## 2020-11-01 DIAGNOSIS — Z0000 Encounter for general adult medical examination without abnormal findings: Secondary | ICD-10-CM | POA: Diagnosis not present

## 2021-03-20 DIAGNOSIS — C61 Malignant neoplasm of prostate: Secondary | ICD-10-CM | POA: Diagnosis not present

## 2021-03-27 DIAGNOSIS — R9721 Rising PSA following treatment for malignant neoplasm of prostate: Secondary | ICD-10-CM | POA: Diagnosis not present

## 2021-03-27 DIAGNOSIS — N3020 Other chronic cystitis without hematuria: Secondary | ICD-10-CM | POA: Diagnosis not present

## 2021-03-27 DIAGNOSIS — N393 Stress incontinence (female) (male): Secondary | ICD-10-CM | POA: Diagnosis not present

## 2021-03-27 DIAGNOSIS — C61 Malignant neoplasm of prostate: Secondary | ICD-10-CM | POA: Diagnosis not present

## 2021-03-27 DIAGNOSIS — N200 Calculus of kidney: Secondary | ICD-10-CM | POA: Diagnosis not present

## 2021-04-04 DIAGNOSIS — N200 Calculus of kidney: Secondary | ICD-10-CM | POA: Diagnosis not present

## 2021-04-04 DIAGNOSIS — N281 Cyst of kidney, acquired: Secondary | ICD-10-CM | POA: Diagnosis not present

## 2021-04-04 DIAGNOSIS — N261 Atrophy of kidney (terminal): Secondary | ICD-10-CM | POA: Diagnosis not present

## 2021-04-04 DIAGNOSIS — K449 Diaphragmatic hernia without obstruction or gangrene: Secondary | ICD-10-CM | POA: Diagnosis not present

## 2021-04-11 ENCOUNTER — Other Ambulatory Visit (HOSPITAL_COMMUNITY): Payer: Self-pay | Admitting: Urology

## 2021-04-11 ENCOUNTER — Other Ambulatory Visit: Payer: Self-pay | Admitting: Urology

## 2021-04-11 DIAGNOSIS — N281 Cyst of kidney, acquired: Secondary | ICD-10-CM

## 2021-04-19 ENCOUNTER — Other Ambulatory Visit: Payer: Self-pay

## 2021-04-19 ENCOUNTER — Ambulatory Visit (HOSPITAL_COMMUNITY)
Admission: RE | Admit: 2021-04-19 | Discharge: 2021-04-19 | Disposition: A | Discharge status: Home / Self Care | Payer: Medicare PPO | Source: Ambulatory Visit | Attending: Urology | Admitting: Urology

## 2021-04-19 DIAGNOSIS — Z8546 Personal history of malignant neoplasm of prostate: Secondary | ICD-10-CM | POA: Diagnosis not present

## 2021-04-19 DIAGNOSIS — N281 Cyst of kidney, acquired: Secondary | ICD-10-CM | POA: Diagnosis not present

## 2021-04-19 DIAGNOSIS — N289 Disorder of kidney and ureter, unspecified: Secondary | ICD-10-CM | POA: Diagnosis not present

## 2021-04-19 DIAGNOSIS — K449 Diaphragmatic hernia without obstruction or gangrene: Secondary | ICD-10-CM | POA: Diagnosis not present

## 2021-04-19 DIAGNOSIS — N261 Atrophy of kidney (terminal): Secondary | ICD-10-CM | POA: Diagnosis not present

## 2021-04-19 MED ORDER — GADOBUTROL 1 MMOL/ML IV SOLN
8.0000 mL | Freq: Once | INTRAVENOUS | Status: AC | PRN
  Administered 2021-04-19: 8 mL via INTRAVENOUS

## 2021-04-25 ENCOUNTER — Encounter: Payer: Self-pay | Admitting: Radiation Oncology

## 2021-04-25 ENCOUNTER — Ambulatory Visit
Admission: RE | Admit: 2021-04-25 | Discharge: 2021-04-25 | Disposition: A | Discharge status: Home / Self Care | Payer: Medicare PPO | Source: Ambulatory Visit | Attending: Radiation Oncology | Admitting: Radiation Oncology

## 2021-04-25 ENCOUNTER — Other Ambulatory Visit: Payer: Self-pay

## 2021-04-25 ENCOUNTER — Encounter: Payer: Self-pay | Admitting: Medical Oncology

## 2021-04-25 VITALS — BP 120/80 | HR 82 | Temp 97.6°F | Resp 18 | Ht 72.0 in | Wt 242.8 lb

## 2021-04-25 DIAGNOSIS — C7951 Secondary malignant neoplasm of bone: Secondary | ICD-10-CM | POA: Insufficient documentation

## 2021-04-25 DIAGNOSIS — C61 Malignant neoplasm of prostate: Secondary | ICD-10-CM | POA: Diagnosis not present

## 2021-04-25 DIAGNOSIS — Z87442 Personal history of urinary calculi: Secondary | ICD-10-CM | POA: Insufficient documentation

## 2021-04-25 DIAGNOSIS — I700 Atherosclerosis of aorta: Secondary | ICD-10-CM | POA: Insufficient documentation

## 2021-04-25 DIAGNOSIS — K219 Gastro-esophageal reflux disease without esophagitis: Secondary | ICD-10-CM | POA: Diagnosis not present

## 2021-04-25 HISTORY — DX: Malignant neoplasm of prostate: C61

## 2021-04-25 NOTE — Progress Notes (Signed)
Introduced myself to patient and his wife as the the prostate nurse navigator and discussed my role. He was diagnosed with prostate cancer in 2004. He has been  treated with surgery, radiation and ADT.  He was noted to have a bone lesion on CT scan 5/3. He is here to discuss radiation to the bone lesion. No barriers to care identified at this time. I gave him my business card and asked him to call me with questions or concerns. He voiced understanding.

## 2021-04-25 NOTE — Progress Notes (Signed)
Radiation Oncology         [phone] ________________________________  Initial Outpatient Consultation  Name: Matthew [name] MRN: [id number]  Date: 04/25/2021  DOB: December 23, 1940  GY:JEHUDJ, Denton Ar, MD  Irine Seal, MD   REFERRING PHYSICIAN: Irine Seal, MD  DIAGNOSIS: 80 y.o. gentleman with oligometastatic prostate cancer involving the right hemipelvis s/p RALP and adjuvant radiotherapy in 2004.    ICD-10-CM   1. Malignant neoplasm of prostate ([place])  C61   2. Malignant neoplasm of prostate metastatic to bone ([facility])  C61 NM PET (PSMA) SKULL TO MID THIGH   C79.51     HISTORY OF PRESENT ILLNESS: Matthew [name] is a 80 y.o. male with a diagnosis of oligometastatic prostate cancer. He was initially diagnosed in 2004 and was treated with prostatectomy followed by adjuvant radiation therapy while he was living in [place]. He relocated to [facility] in 2007 and was noted rising, detectable PSA at 3.36 in 05/2009 so he was enrolled in the [place] CS37 Open-label study of Degarelix Intermittent Therapy vs. Continuous Androgen Deprivation Therapy with Leuprolide or Degarelix in Patients with Carcinoma of the Prostate with Biochemical Failure after Localized Therapy.  He was randomized to arm C continuous Leuprolide Acetate (46-Month) Starting on 08/01/2009 with his last dose in 07/2010. His PSA reached a nadir of 0.02. He has continued in routine follow-up with Dr. Jeffie Pollock and his PCP, Dr. Lysle Rubens. Since completing ADT, his PSA has risen with the recovery of his testosterone, reaching 8.62 in 08/2016 and has since fluctuated but overall remained stable between 7.5 and 8.6.  His most recent PSA on 03/20/2021 was stable at 7.34, and in fact, decreased from 8.51 on 12/02/2019.  At the time of a recent follow-up on 03/27/2021, he had a KUB for monitoring of his nephrolithiasis and this showed a new, 2.8 cm sclerotic lesion in the right inferior pubic ramus.  He proceeded to CT A/P on 04/04/21 to further evaluate the  lesion, as well as left hydronephrosis and nephrolithiasis.  The CT A/P confirmed the sclerotic lesion in the right ischium as well as a tiny sclerotic lesion in the right posterior acetabulum, consistent with metastatic disease.  There was no malignant appearing lymphadenopathy in the abdomen or pelvis and no evidence of visceral metastatic disease.   The patient reviewed the imaging results with his urologist and he has kindly been referred today for discussion of potential radiation treatment options.   PREVIOUS RADIATION THERAPY: Yes 2004: Adjuvant radiotherapy to the prostatic fossa in [place]  PAST MEDICAL HISTORY:  Past Medical History:  Diagnosis Date  . Barrett's esophagus   . Cancer [facility]) 2004   prostate   . GERD (gastroesophageal reflux disease)    pt denies  . History of kidney stones   . Hypertension   . Pneumonia    In high school  . Prostate cancer ([place])       PAST SURGICAL HISTORY: Past Surgical History:  Procedure Laterality Date  . COLONOSCOPY WITH PROPOFOL N/A 01/09/2016   Procedure: COLONOSCOPY WITH PROPOFOL;  Surgeon: Garlan Fair, MD;  Location: [facility];  Service: Endoscopy;  Laterality: N/A;  . CYSTOSCOPY     x 1  . ESOPHAGOGASTRODUODENOSCOPY (EGD) WITH PROPOFOL N/A 01/09/2016   Procedure: ESOPHAGOGASTRODUODENOSCOPY (EGD) WITH PROPOFOL;  Surgeon: Garlan Fair, MD;  Location: [facility];  Service: Endoscopy;  Laterality: N/A;  . EYE SURGERY     bil cataract  . KNEE ARTHROSCOPY Left 25 yrs ago  . PARATHYROIDECTOMY  N/A 02/25/2020   Procedure: NECK EXPLORATION WITH PARATHYROIDECTOMY;  Surgeon: Armandina Gemma, MD;  Location: [facility] ORS;  Service: General;  Laterality: N/A;  . PROSTATECTOMY  2004    FAMILY HISTORY:  Family History  Problem Relation Age of Onset  . Breast cancer Neg Hx   . Prostate cancer Neg Hx   . Colon cancer Neg Hx   . Pancreatic cancer Neg Hx     SOCIAL HISTORY:  Social History   Socioeconomic History  . Marital status:  Married    Spouse name: Not on file  . Number of children: Not on file  . Years of education: Not on file  . Highest education level: Not on file  Occupational History  . Not on file  Tobacco Use  . Smoking status: Never Smoker  . Smokeless tobacco: Never Used  Vaping Use  . Vaping Use: Never used  Substance and Sexual Activity  . Alcohol use: Yes    Comment: occasional beer  . Drug use: No  . Sexual activity: Not Currently  Other Topics Concern  . Not on file  Social History Narrative  . Not on file   Social Determinants of Health   Financial Resource Strain: Not on file  Food Insecurity: Not on file  Transportation Needs: Not on file  Physical Activity: Not on file  Stress: Not on file  Social Connections: Not on file  Intimate Partner Violence: Not on file    ALLERGIES: Patient has no known allergies.  MEDICATIONS:  Current Outpatient Medications  Medication Sig Dispense Refill  . allopurinol (ZYLOPRIM) 300 MG tablet Take 300 mg by mouth daily.     Marland Kitchen amLODipine (NORVASC) 10 MG tablet Take 10 mg by mouth daily.    Marland Kitchen aspirin EC 325 MG tablet Take 325 mg by mouth every 4 (four) hours as needed. Prn bilateral chronic knee pain.    Marland Kitchen atorvastatin (LIPITOR) 20 MG tablet Take 20 mg by mouth daily.    Marland Kitchen esomeprazole (NEXIUM) 20 MG capsule Take 20 mg by mouth daily.     . furosemide (LASIX) 80 MG tablet Take 80 mg by mouth 2 (two) times daily.    . metoprolol succinate (TOPROL-XL) 100 MG 24 hr tablet Take 100 mg by mouth daily. Take with or immediately following a meal.     No current facility-administered medications for this encounter.    REVIEW OF SYSTEMS:  On review of systems, the patient reports that he is doing well overall. He denies any chest pain, shortness of breath, cough, fevers, chills, night sweats, unintended weight changes. He denies any bowel disturbances, and denies abdominal pain, nausea or vomiting. He denies any new musculoskeletal or joint aches or  pains. His IPSS was 9, indicating mild to moderate urinary symptoms with nocturia x4, frequency and incontinence requiring 3-4 pads per day, present since the time of surgery. His SHIM was 5, indicating he has severe erectile dysfunction. A complete review of systems is obtained and is otherwise negative.    PHYSICAL EXAM:  Wt Readings from Last 3 Encounters:  04/25/21 242 lb 12.8 oz (110.1 kg)  02/25/20 234 lb (106.1 kg)  02/18/20 234 lb (106.1 kg)   Temp Readings from Last 3 Encounters:  04/25/21 97.6 F (36.4 C)  02/26/20 (!) 97.5 F (36.4 C) (Oral)  02/18/20 98.6 F (37 C) (Oral)   BP Readings from Last 3 Encounters:  04/25/21 120/80  02/26/20 128/79  02/18/20 124/84   Pulse Readings from Last 3 Encounters:  04/25/21 82  02/26/20 78  02/18/20 67   Pain Assessment Pain Score: 2  (Reports chronic knee pain. Denies new pain.) Pain Frequency: Constant Pain Loc: Knee/10  In general this is a well appearing Caucasian male in no acute distress. He's alert and oriented x4 and appropriate throughout the examination. Cardiopulmonary assessment is negative for acute distress, and he exhibits normal effort.     KPS = 90  100 - Normal; no complaints; no evidence of disease. 90   - Able to carry on normal activity; minor signs or symptoms of disease. 80   - Normal activity with effort; some signs or symptoms of disease. 6   - Cares for self; unable to carry on normal activity or to do active work. 60   - Requires occasional assistance, but is able to care for most of his personal needs. 50   - Requires considerable assistance and frequent medical care. 37   - Disabled; requires special care and assistance. 45   - Severely disabled; hospital admission is indicated although death not imminent. 64   - Very sick; hospital admission necessary; active supportive treatment necessary. 10   - Moribund; fatal processes progressing rapidly. 0     - Dead  Karnofsky DA, Abelmann [place], [place]  LS and Burchenal [facility] [phone]) The use of the nitrogen mustards in the palliative treatment of carcinoma: with particular reference to bronchogenic carcinoma Cancer 1 634-56  LABORATORY DATA:  Lab Results  Component Value Date   WBC 8.7 02/18/2020   HGB 14.6 02/18/2020   HCT 44.9 02/18/2020   MCV 97.6 02/18/2020   PLT 308 02/18/2020   Lab Results  Component Value Date   NA 139 02/26/2020   K 3.8 02/26/2020   CL 105 02/26/2020   CO2 26 02/26/2020   No results found for: ALT, AST, GGT, ALKPHOS, BILITOT   RADIOGRAPHY: MR ABDOMEN WWO CONTRAST  Result Date: 04/20/2021 CLINICAL DATA:  Re-evaluate renal lesions. History of prostate cancer. EXAM: MRI ABDOMEN WITHOUT AND WITH CONTRAST TECHNIQUE: Multiplanar multisequence MR imaging of the abdomen was performed both before and after the administration of intravenous contrast. CONTRAST:  7mL GADAVIST GADOBUTROL 1 MMOL/ML IV SOLN COMPARISON:  04/16/2007 MRI. 04/05/2021 CT urogram from [facility]. FINDINGS: Lower chest: Normal heart size without pericardial or pleural effusion. Small hiatal hernia. Hepatobiliary: Normal liver. Normal gallbladder, without biliary ductal dilatation. Pancreas:  Normal, without mass or ductal dilatation. Spleen: Tiny T2 hyperintense hypoenhancing splenic lesions are likely present on the 2008 exam and of doubtful clinical significance. No splenomegaly. Adrenals/Urinary Tract: Normal adrenal glands. The right kidney is moderately atrophic. Within both kidneys, there are innumerable simple and complex lesions. Complexity evidenced by T1 hyperintensity and T2 hypointensity. After contrast administration, no evidence of solid enhancing lesions, including on subtracted images. A "pseudotumor" in the lower pole right kidney on 63/21 is identified and evidenced by retained parenchyma in the setting of atrophy. No hydronephrosis. Stomach/Bowel: Proximal gastric underdistention. Apparent wall thickening could be secondary to  underdistention including on 19/24. Normal abdominal bowel loops. Vascular/Lymphatic: Aortic atherosclerosis. No abdominal adenopathy. Other:  No ascites. Musculoskeletal: No acute osseous abnormality. IMPRESSION: 1. Bilateral renal lesions which are consistent with cysts and complex cysts. No solid enhancing renal lesion identified. 2. Moderate to marked right renal atrophy. 3. Small hiatal hernia. Apparent proximal gastric wall thickening could be due to underdistention. Correlate with symptoms of gastritis. 4.  Aortic Atherosclerosis (ICD10-I70.0). Electronically Signed   By: Abigail Miyamoto M.D.   On:  04/20/2021 12:35      IMPRESSION/PLAN: 1. 80 y.o. gentleman with oligometastatic prostate cancer involving the right hemipelvis s/p RALP and adjuvant radiotherapy in 2004.  Today, we talked to the patient and his wife about the findings and workup thus far. We discussed the natural history of prostate cancer and general treatment, highlighting the role of focused stereotactic radiotherapy in the management of limited osseous metastases. Our recommendation is to proceed with a PSMA scan to determine the extent of disease and help guide treatment planning.  Pending this scan confirms oligometastatic disease only, we discussed proceeding with a 3-5 fraction course of stereotactic body radiotherapy (SBRT) targeting the involved bony lesions.  We reviewed the available radiation techniques, and focused on the details and logistics of delivery as well as the anticipated acute and late sequelae associated with radiation in this setting. The patient and his wife were encouraged to ask questions that were answered to their stated satisfaction.  At the end of our conversation, the patient is in agreement to proceed with PSMA scan. We will coordinate the imaging and plan to reach out to the patient by phone with the results as soon as they are available.  We will further discuss treatment recommendations at that time and  proceed with treatment planning accordingly, if indicated.  We will share our discussion with Dr. Jeffie Pollock and keep him informed of results and treatment recommendations going forward.  We enjoyed meeting the patient and his wife today and look forward to continuing to participate in his care.  They know that they are welcome to call at anytime in the interim with any questions or concerns related to radiotherapy.  We personally spent 75 minutes in this encounter including chart review, reviewing radiological studies, meeting face-to-face with the patient, entering orders and completing documentation.    Nicholos Johns, PA-C    Tyler Pita, MD  [facility] Direct Dial: [phone]  Fax: [phone] [email or web address]  Skype  [email or web address]   This document serves as a record of services personally performed by Tyler Pita, MD and Freeman Caldron, PA-C. It was created on their behalf by Wilburn Mylar, a trained medical scribe. The creation of this record is based on the scribe's personal observations and the provider's statements to them. This document has been checked and approved by the attending provider.

## 2021-05-19 ENCOUNTER — Telehealth: Payer: Self-pay | Admitting: *Deleted

## 2021-05-19 NOTE — Telephone Encounter (Signed)
Called patient to inform of [facility] appt. for 05-26-21, lvm for a return call

## 2021-05-23 ENCOUNTER — Other Ambulatory Visit: Payer: Self-pay

## 2021-05-23 ENCOUNTER — Ambulatory Visit (HOSPITAL_COMMUNITY)
Admission: RE | Admit: 2021-05-23 | Discharge: 2021-05-23 | Disposition: A | Discharge status: Home / Self Care | Payer: Medicare PPO | Source: Ambulatory Visit | Attending: Urology | Admitting: Urology

## 2021-05-23 DIAGNOSIS — C7951 Secondary malignant neoplasm of bone: Secondary | ICD-10-CM | POA: Insufficient documentation

## 2021-05-23 DIAGNOSIS — R918 Other nonspecific abnormal finding of lung field: Secondary | ICD-10-CM | POA: Diagnosis not present

## 2021-05-23 DIAGNOSIS — C61 Malignant neoplasm of prostate: Secondary | ICD-10-CM | POA: Insufficient documentation

## 2021-05-23 MED ORDER — PIFLIFOLASTAT F 18 (PYLARIFY) INJECTION
9.0000 | Freq: Once | INTRAVENOUS | Status: AC
  Administered 2021-05-23: 8.7 via INTRAVENOUS

## 2021-05-23 MED ORDER — FLUDEOXYGLUCOSE F - 18 (FDG) INJECTION: 14.0000 | Freq: Once | INTRAVENOUS | Status: DC | PRN

## 2021-05-24 ENCOUNTER — Ambulatory Visit: Payer: Medicare PPO | Admitting: Radiation Oncology

## 2021-05-26 ENCOUNTER — Ambulatory Visit
Admission: RE | Admit: 2021-05-26 | Discharge: 2021-05-26 | Disposition: A | Discharge status: Home / Self Care | Payer: Medicare PPO | Source: Ambulatory Visit | Attending: Radiation Oncology | Admitting: Radiation Oncology

## 2021-05-26 ENCOUNTER — Ambulatory Visit
Admission: RE | Admit: 2021-05-26 | Discharge: 2021-05-26 | Disposition: A | Discharge status: Home / Self Care | Payer: Medicare PPO | Source: Ambulatory Visit | Attending: Urology | Admitting: Urology

## 2021-05-26 ENCOUNTER — Other Ambulatory Visit: Payer: Self-pay

## 2021-05-26 ENCOUNTER — Encounter: Payer: Self-pay | Admitting: Urology

## 2021-05-26 VITALS — BP 125/89 | HR 78 | Temp 96.8°F | Resp 20 | Ht 72.0 in | Wt 235.0 lb

## 2021-05-26 DIAGNOSIS — C61 Malignant neoplasm of prostate: Secondary | ICD-10-CM | POA: Insufficient documentation

## 2021-05-26 DIAGNOSIS — Z452 Encounter for adjustment and management of vascular access device: Secondary | ICD-10-CM | POA: Insufficient documentation

## 2021-05-26 DIAGNOSIS — C7951 Secondary malignant neoplasm of bone: Secondary | ICD-10-CM | POA: Diagnosis not present

## 2021-05-26 NOTE — Progress Notes (Signed)
  Radiation Oncology         [phone] ________________________________  Name: Matthew [name] MRN: [id number]  Date: 05/26/2021  DOB: [birth year]  STEREOTACTIC BODY RADIOTHERAPY SIMULATION AND TREATMENT PLANNING NOTE    ICD-10-CM   1. Malignant neoplasm of prostate metastatic to bone [facility])  C61    C79.51       DIAGNOSIS:  80 y.o. gentleman with oligometastatic prostate cancer involving the right hemipelvis s/p RALP and adjuvant radiotherapy in 2004  NARRATIVE:  The patient was brought to the [place].  Identity was confirmed.  All relevant records and images related to the planned course of therapy were reviewed.  The patient freely provided informed written consent to proceed with treatment after reviewing the details related to the planned course of therapy. The consent form was witnessed and verified by the simulation staff.  Then, the patient was set-up in a stable reproducible  supine position for radiation therapy.  A BodyFix immobilization pillow was fabricated for reproducible positioning.  Surface markings were placed.  The CT images were loaded into the planning software.  The gross target volumes (GTV) and planning target volumes (PTV) were delinieated, and avoidance structures were contoured.  Treatment planning then occurred.  The radiation prescription was entered and confirmed.  A total of two complex treatment devices were fabricated in the form of the BodyFix immobilization pillow and a neck accuform cushion.  I have requested : 3D Simulation  I have requested a DVH of the following structures: targets and all normal structures near the target including bowel bladder and femoral neck as noted on the radiation plan to maintain doses in adherence with established limits  SPECIAL TREATMENT PROCEDURE:  The planned course of therapy using radiation constitutes a special treatment procedure. Special care is required in the management of this patient for the following  reasons. High dose per fraction requiring special monitoring for increased toxicities of treatment including daily imaging..  The special nature of the planned course of radiotherapy will require increased physician supervision and oversight to ensure patient's safety with optimal treatment outcomes.  PLAN:  The patient will receive 50 Gy in 5 fractions to the right ischial lesion and right acetabular lesion.  ________________________________  Sheral Apley Tammi Klippel, M.D.

## 2021-05-26 NOTE — Progress Notes (Signed)
Patient in for re-consult for bone lesions to pelvis after prostate cancer treatment. Patient denies any pain states he has no pain. Patient has had radiation to the prostate fossa in 2004. This was done in [place]. No records have ben obtained states they are unable to find them.

## 2021-05-26 NOTE — Progress Notes (Signed)
Radiation Oncology         [phone]) [phone] ________________________________  Outpatient Re-Evaluation  Name: Matthew [name] MRN: [id number]  Date: 05/26/2021  DOB: 06-17-1941  ZW:CHENID, Denton Ar, MD  Irine Seal, MD   REFERRING PHYSICIAN: Irine Seal, MD  DIAGNOSIS: 80 y.o. gentleman with oligometastatic prostate cancer involving the right hemipelvis s/p RALP and adjuvant radiotherapy in 2004.    ICD-10-CM   1. Malignant neoplasm of prostate metastatic to bone [facility])  C61    C79.51        HISTORY OF PRESENT ILLNESS: Matthew [name] is a 80 y.o. male with a diagnosis of oligometastatic prostate cancer. He was initially diagnosed in 2004 and was treated with prostatectomy followed by adjuvant radiation therapy while he was living in [place]. He relocated to [facility] in 2007 and was noted rising, detectable PSA at 3.36 in 05/2009 so he was enrolled in the [employer] CS37 Open-label study of Degarelix Intermittent Therapy vs. Continuous Androgen Deprivation Therapy with Leuprolide or Degarelix in Patients with Carcinoma of the Prostate with Biochemical Failure after Localized Therapy.  He was randomized to arm C continuous Leuprolide Acetate (5-Month) Starting on 08/01/2009 with his last dose in 07/2010. His PSA reached a nadir of 0.02. He has continued in routine follow-up with Dr. Jeffie Pollock and his PCP, Dr. Lysle Rubens. Since completing ADT, his PSA has risen with the recovery of his testosterone, reaching 8.62 in 08/2016 and has since fluctuated but overall remained stable between 7.5 and 8.6.  His most recent PSA on 03/20/2021 was stable at 7.34, and in fact, decreased from 8.51 on 12/02/2019.  At the time of a recent urology follow-up on 03/27/2021, he had a KUB for monitoring of his nephrolithiasis and this showed a new, 2.8 cm sclerotic lesion in the right inferior pubic ramus.  He proceeded to CT A/P on 04/04/21 to further evaluate the lesion, as well as left hydronephrosis and nephrolithiasis.  The CT A/P  confirmed the sclerotic lesion in the right ischium as well as a tiny sclerotic lesion in the right posterior acetabulum, consistent with metastatic disease.  There was no malignant appearing lymphadenopathy in the abdomen or pelvis and no evidence of visceral metastatic disease.   We met with the patient on 04/25/21 to discuss potential salvage radiation treatment options for management of the new oligometastatic osseous lesions. We recommended proceeding with PSMA scan to determine the extent of disease and help guide treatment planning. PSMA was performed on 05/23/21 and showed mild tracer affinity corresponding to the right ischial sclerotic lesion, most consistent with osseous metastasis. There was also some tracer affinity at the superior aspect of the prostatectomy bed without any well-defined mass and likely to be artifactual secondary to adjacent bladder activity. Otherwise, no soft tissue or visceral metastasis identified. There were a couple of small nonspecific pulmonary nodules.  The patient returns today to review the imaging results and further discuss potential radiation treatment options.  PREVIOUS RADIATION THERAPY: Yes 2004: Adjuvant radiotherapy to the prostatic fossa in [place]  PAST MEDICAL HISTORY:  Past Medical History:  Diagnosis Date   Barrett's esophagus    Cancer ([place]) 2004   prostate    GERD (gastroesophageal reflux disease)    pt denies   History of kidney stones    Hypertension    Pneumonia    In high school   Prostate cancer ([place])       PAST SURGICAL HISTORY: Past Surgical History:  Procedure Laterality Date   COLONOSCOPY WITH  PROPOFOL N/A 01/09/2016   Procedure: COLONOSCOPY WITH PROPOFOL;  Surgeon: Garlan Fair, MD;  Location: [facility];  Service: Endoscopy;  Laterality: N/A;   CYSTOSCOPY     x 1   ESOPHAGOGASTRODUODENOSCOPY (EGD) WITH PROPOFOL N/A 01/09/2016   Procedure: ESOPHAGOGASTRODUODENOSCOPY (EGD) WITH PROPOFOL;  Surgeon: Garlan Fair,  MD;  Location: [facility];  Service: Endoscopy;  Laterality: N/A;   EYE SURGERY     bil cataract   KNEE ARTHROSCOPY Left 25 yrs ago   PARATHYROIDECTOMY N/A 02/25/2020   Procedure: NECK EXPLORATION WITH PARATHYROIDECTOMY;  Surgeon: Armandina Gemma, MD;  Location: [facility];  Service: General;  Laterality: N/A;   PROSTATECTOMY  2004    FAMILY HISTORY:  Family History  Problem Relation Age of Onset   Breast cancer Neg Hx    Prostate cancer Neg Hx    Colon cancer Neg Hx    Pancreatic cancer Neg Hx     SOCIAL HISTORY:  Social History   Socioeconomic History   Marital status: Married    Spouse name: Not on file   Number of children: Not on file   Years of education: Not on file   Highest education level: Not on file  Occupational History   Not on file  Tobacco Use   Smoking status: Never   Smokeless tobacco: Never  Vaping Use   Vaping Use: Never used  Substance and Sexual Activity   Alcohol use: Yes    Comment: occasional beer   Drug use: No   Sexual activity: Not Currently  Other Topics Concern   Not on file  Social History Narrative   Not on file   Social Determinants of Health   Financial Resource Strain: Not on file  Food Insecurity: Not on file  Transportation Needs: Not on file  Physical Activity: Not on file  Stress: Not on file  Social Connections: Not on file  Intimate Partner Violence: Not on file    ALLERGIES: Patient has no known allergies.  MEDICATIONS:  Current Outpatient Medications  Medication Sig Dispense Refill   allopurinol (ZYLOPRIM) 300 MG tablet Take 300 mg by mouth daily.      amLODipine (NORVASC) 10 MG tablet Take 10 mg by mouth daily.     atorvastatin (LIPITOR) 20 MG tablet Take 20 mg by mouth daily.     esomeprazole (NEXIUM) 20 MG capsule Take 20 mg by mouth daily.      furosemide (LASIX) 80 MG tablet Take 80 mg by mouth 2 (two) times daily.     metoprolol succinate (TOPROL-XL) 100 MG 24 hr tablet Take 100 mg by mouth daily. Take with or  immediately following a meal.     aspirin EC 325 MG tablet Take 325 mg by mouth every 4 (four) hours as needed. Prn bilateral chronic knee pain. (Patient not taking: Reported on 05/26/2021)     No current facility-administered medications for this encounter.    REVIEW OF SYSTEMS:  On review of systems, the patient reports that he is doing well overall. He denies any chest pain, shortness of breath, cough, fevers, chills, night sweats, unintended weight changes. He denies any bowel disturbances, and denies abdominal pain, nausea or vomiting. He denies any new musculoskeletal or joint aches or pains. His IPSS was 9, indicating mild to moderate urinary symptoms with nocturia x4, frequency and incontinence requiring 3-4 pads per day, present since the time of surgery. His SHIM was 5, indicating he has severe erectile dysfunction. A complete review of systems is obtained  and is otherwise negative.    PHYSICAL EXAM:  Wt Readings from Last 3 Encounters:  05/26/21 235 lb (106.6 kg)  04/25/21 242 lb 12.8 oz (110.1 kg)  02/25/20 234 lb (106.1 kg)   Temp Readings from Last 3 Encounters:  05/26/21 (!) 96.8 F (36 C) (Temporal)  04/25/21 97.6 F (36.4 C)  02/26/20 (!) 97.5 F (36.4 C) (Oral)   BP Readings from Last 3 Encounters:  05/26/21 125/89  04/25/21 120/80  02/26/20 128/79   Pulse Readings from Last 3 Encounters:  05/26/21 78  04/25/21 82  02/26/20 78   Pain Assessment Pain Score: 0-No pain/10  In general this is a well appearing Caucasian male in no acute distress. He's alert and oriented x4 and appropriate throughout the examination. Cardiopulmonary assessment is negative for acute distress, and he exhibits normal effort.     KPS = 90  100 - Normal; no complaints; no evidence of disease. 90   - Able to carry on normal activity; minor signs or symptoms of disease. 80   - Normal activity with effort; some signs or symptoms of disease. 57   - Cares for self; unable to carry on  normal activity or to do active work. 60   - Requires occasional assistance, but is able to care for most of his personal needs. 50   - Requires considerable assistance and frequent medical care. 76   - Disabled; requires special care and assistance. 9   - Severely disabled; hospital admission is indicated although death not imminent. 73   - Very sick; hospital admission necessary; active supportive treatment necessary. 10   - Moribund; fatal processes progressing rapidly. 0     - Dead  Karnofsky DA, Abelmann [place], Craver LS and Burchenal [facility] [phone]) The use of the nitrogen mustards in the palliative treatment of carcinoma: with particular reference to bronchogenic carcinoma Cancer 1 634-56  LABORATORY DATA:  Lab Results  Component Value Date   WBC 8.7 02/18/2020   HGB 14.6 02/18/2020   HCT 44.9 02/18/2020   MCV 97.6 02/18/2020   PLT 308 02/18/2020   Lab Results  Component Value Date   NA 139 02/26/2020   K 3.8 02/26/2020   CL 105 02/26/2020   CO2 26 02/26/2020   No results found for: ALT, AST, GGT, ALKPHOS, BILITOT   RADIOGRAPHY: NM PET (PSMA) SKULL TO MID THIGH  Result Date: 05/24/2021 CLINICAL DATA:  recent CT demonstrating new sclerotic lesions within the pelvis, suspicious for metastatic disease. History of prostate cancer with PSA of 7.3. Diagnosed 19 years ago with prostatectomy and radiation therapy. EXAM: NUCLEAR MEDICINE PET SKULL BASE TO THIGH TECHNIQUE: 8.7 mCi F18 Piflufolastat (Pylarify) was injected intravenously. Full-ring PET imaging was performed from the skull base to thigh after the radiotracer. CT data was obtained and used for attenuation correction and anatomic localization. COMPARISON:  Abdominopelvic CT of 04/04/2021 from [facility]. An abdominal MR from 04/19/2021 is also reviewed. FINDINGS: NECK No areas of abnormal tracer affinity. Incidental CT finding: No cervical adenopathy. Mild mucosal thickening of left maxillary sinus. CHEST No pulmonary  parenchymal or thoracic nodal tracer uptake. Incidental CT finding: Tortuous thoracic aorta. Aortic and coronary artery calcification. Mild cardiomegaly. Small hiatal hernia. Right lower lobe pulmonary nodule of 5 mm is not readily apparent on the 2012 abdominal CT. Inferior right middle lobe vague pulmonary nodularity including at up to 7 mm on 45/8. ABDOMEN/PELVIS Prostate: Status post prostatectomy. Along the superior aspect of the operative bed, at the confluence  of the seminal vesicles, is apparent tracer affinity, which is immediately adjacent to the urinary bladder. Example at a S.U.V. max of 6.4 on 210/4. No well-defined mass in this area. Lymph nodes: No abnormal radiotracer accumulation within pelvic or abdominal nodes. Liver: No evidence of liver metastasis Incidental CT finding: Right renal atrophy. Renal lesions which were characterized on prior MRI. Right larger than left renal collecting system calculi including at 1.3 cm in the lower pole. Fat containing left larger than right inguinal hernias. SKELETON The right ischial tuberosity sclerotic lesion demonstrates mild tracer of fatty, including at 2.4 cm and a S.U.V. max of 3.2 on 221/4. The smaller right acetabular lesion on the prior CT is below PET resolution. IMPRESSION: 1. Mild tracer affinity corresponding to the right ischial sclerotic lesion. This is most consistent with osseous metastasis, especially given interval development since 08/01/2011. 2. Tracer affinity at the superior aspect of the prostatectomy bed, without well-defined mass. This could be artifactual secondary to adjacent bladder activity. Small volume local recurrence cannot be entirely excluded. 3. Otherwise, no soft tissue metastasis identified. 4. Incidental findings, including: Small hiatal hernia. Right renal atrophy. Bilateral nephrolithiasis. 5. Nonspecific small pulmonary nodules. Consider chest CT follow-up at 6 months. Electronically Signed   By: Abigail Miyamoto M.D.   On:  05/24/2021 13:11       IMPRESSION/PLAN: 1. 80 y.o. gentleman with oligometastatic prostate cancer involving the right hemipelvis s/p RALP and adjuvant radiotherapy in 2004.  Today, we talked to the patient and his wife about the findings and workup thus far. We discussed the natural history of oligometastatic prostate cancer and general treatment, highlighting the role of focused stereotactic radiotherapy in the management of limited osseous metastases. Based on the results of the PSMA scan, we recommend proceeding with a 3-5 fraction course of stereotactic body radiotherapy (SBRT) targeting the involved right ischial and right acetabular lesions.  We reviewed the available radiation techniques, and focused on the details and logistics of delivery as well as the anticipated acute and late sequelae associated with radiation in this setting. The patient and his wife were encouraged to ask questions that were answered to their stated satisfaction.  At the end of our conversation, the patient is in agreement to proceed with the recommended 3-5 fraction course of stereotactic body radiotherapy (SBRT) targeting the involved right ischial and right acetabular lesions. He appears to have a good understanding of his disease and our treatment recommendations which are of curative intent.  He has freely signed written consent to proceed today in the office and a copy of this document will be placed in his medical record.  He will have CT simulation/treatment planning in the office today following our visit, in anticipation of beginning his treatments in the near future.  We will share our discussion with Dr. Jeffie Pollock and move forward with treatment planning accordingly.  We enjoyed meeting with the patient and his wife again today and look forward to continuing to participate in his care.  They know they are welcome to call at anytime with any questions or concerns related to the radiotherapy.  2. Small, nonspecific  pulmonary nodules- this was an incidental finding on his recent PSMA scan.  Both lesions are subcentimeter and therefore nonspecific but not felt to be related to his prostate cancer.  I will share this information and his imaging report with his PCP so that they can determine the best plan moving forward regarding monitoring these lesions.  He is comfortable and in  agreement with the stated plan.  We personally spent 45 minutes in this encounter including chart review, reviewing radiological studies, meeting face-to-face with the patient, entering orders and completing documentation.    Nicholos Johns, PA-C    Tyler Pita, MD  [facility] Direct Dial: [phone]  Fax: [phone] [email or web address]  Skype  [email or web address]   This document serves as a record of services personally performed by Tyler Pita, MD and Freeman Caldron, PA-C. It was created on their behalf by Wilburn Mylar, a trained medical scribe. The creation of this record is based on the scribe's personal observations and the provider's statements to them. This document has been checked and approved by the attending provider.

## 2021-05-29 DIAGNOSIS — Z452 Encounter for adjustment and management of vascular access device: Secondary | ICD-10-CM | POA: Diagnosis not present

## 2021-05-29 DIAGNOSIS — C7951 Secondary malignant neoplasm of bone: Secondary | ICD-10-CM | POA: Diagnosis not present

## 2021-05-29 DIAGNOSIS — C61 Malignant neoplasm of prostate: Secondary | ICD-10-CM | POA: Diagnosis not present

## 2021-06-06 ENCOUNTER — Ambulatory Visit
Admission: RE | Admit: 2021-06-06 | Discharge: 2021-06-06 | Disposition: A | Discharge status: Home / Self Care | Payer: Medicare PPO | Source: Ambulatory Visit | Attending: Radiation Oncology | Admitting: Radiation Oncology

## 2021-06-06 ENCOUNTER — Other Ambulatory Visit: Payer: Self-pay

## 2021-06-06 DIAGNOSIS — C61 Malignant neoplasm of prostate: Secondary | ICD-10-CM | POA: Insufficient documentation

## 2021-06-06 DIAGNOSIS — Z452 Encounter for adjustment and management of vascular access device: Secondary | ICD-10-CM | POA: Diagnosis not present

## 2021-06-07 ENCOUNTER — Ambulatory Visit: Payer: Medicare PPO

## 2021-06-08 ENCOUNTER — Other Ambulatory Visit: Payer: Self-pay

## 2021-06-08 ENCOUNTER — Ambulatory Visit
Admission: RE | Admit: 2021-06-08 | Discharge: 2021-06-08 | Disposition: A | Discharge status: Home / Self Care | Payer: Medicare PPO | Source: Ambulatory Visit | Attending: Radiation Oncology | Admitting: Radiation Oncology

## 2021-06-08 DIAGNOSIS — Z452 Encounter for adjustment and management of vascular access device: Secondary | ICD-10-CM | POA: Diagnosis not present

## 2021-06-08 DIAGNOSIS — C61 Malignant neoplasm of prostate: Secondary | ICD-10-CM | POA: Diagnosis not present

## 2021-06-09 ENCOUNTER — Ambulatory Visit: Payer: Medicare PPO

## 2021-06-12 ENCOUNTER — Other Ambulatory Visit: Payer: Self-pay

## 2021-06-12 ENCOUNTER — Ambulatory Visit
Admission: RE | Admit: 2021-06-12 | Discharge: 2021-06-12 | Disposition: A | Discharge status: Home / Self Care | Payer: Medicare PPO | Source: Ambulatory Visit | Attending: Radiation Oncology | Admitting: Radiation Oncology

## 2021-06-12 DIAGNOSIS — Z452 Encounter for adjustment and management of vascular access device: Secondary | ICD-10-CM | POA: Diagnosis not present

## 2021-06-12 DIAGNOSIS — C61 Malignant neoplasm of prostate: Secondary | ICD-10-CM | POA: Diagnosis not present

## 2021-06-14 ENCOUNTER — Other Ambulatory Visit: Payer: Self-pay

## 2021-06-14 ENCOUNTER — Ambulatory Visit
Admission: RE | Admit: 2021-06-14 | Discharge: 2021-06-14 | Disposition: A | Discharge status: Home / Self Care | Payer: Medicare PPO | Source: Ambulatory Visit | Attending: Radiation Oncology | Admitting: Radiation Oncology

## 2021-06-14 DIAGNOSIS — C61 Malignant neoplasm of prostate: Secondary | ICD-10-CM | POA: Diagnosis not present

## 2021-06-14 DIAGNOSIS — Z452 Encounter for adjustment and management of vascular access device: Secondary | ICD-10-CM | POA: Diagnosis not present

## 2021-06-16 ENCOUNTER — Other Ambulatory Visit: Payer: Self-pay

## 2021-06-16 ENCOUNTER — Encounter: Payer: Self-pay | Admitting: Urology

## 2021-06-16 ENCOUNTER — Ambulatory Visit
Admission: RE | Admit: 2021-06-16 | Discharge: 2021-06-16 | Disposition: A | Discharge status: Home / Self Care | Payer: Medicare PPO | Source: Ambulatory Visit | Attending: Radiation Oncology | Admitting: Radiation Oncology

## 2021-06-16 DIAGNOSIS — Z452 Encounter for adjustment and management of vascular access device: Secondary | ICD-10-CM | POA: Diagnosis not present

## 2021-06-16 DIAGNOSIS — C7951 Secondary malignant neoplasm of bone: Secondary | ICD-10-CM | POA: Diagnosis not present

## 2021-06-16 DIAGNOSIS — C61 Malignant neoplasm of prostate: Secondary | ICD-10-CM

## 2021-07-04 DIAGNOSIS — K529 Noninfective gastroenteritis and colitis, unspecified: Secondary | ICD-10-CM | POA: Diagnosis not present

## 2021-07-10 ENCOUNTER — Inpatient Hospital Stay (HOSPITAL_COMMUNITY)
Admission: EM | Admit: 2021-07-10 | Discharge: 2021-07-12 | DRG: 872 | Disposition: A | Discharge status: Home / Self Care | Payer: Medicare PPO | Attending: Internal Medicine | Admitting: Internal Medicine

## 2021-07-10 ENCOUNTER — Emergency Department (HOSPITAL_COMMUNITY): Payer: Medicare PPO

## 2021-07-10 ENCOUNTER — Encounter (HOSPITAL_COMMUNITY): Payer: Self-pay | Admitting: *Deleted

## 2021-07-10 ENCOUNTER — Other Ambulatory Visit: Payer: Self-pay

## 2021-07-10 DIAGNOSIS — E892 Postprocedural hypoparathyroidism: Secondary | ICD-10-CM | POA: Diagnosis present

## 2021-07-10 DIAGNOSIS — I5032 Chronic diastolic (congestive) heart failure: Secondary | ICD-10-CM | POA: Diagnosis present

## 2021-07-10 DIAGNOSIS — Z923 Personal history of irradiation: Secondary | ICD-10-CM | POA: Diagnosis not present

## 2021-07-10 DIAGNOSIS — R6510 Systemic inflammatory response syndrome (SIRS) of non-infectious origin without acute organ dysfunction: Secondary | ICD-10-CM | POA: Diagnosis present

## 2021-07-10 DIAGNOSIS — Z87442 Personal history of urinary calculi: Secondary | ICD-10-CM | POA: Diagnosis not present

## 2021-07-10 DIAGNOSIS — Z7982 Long term (current) use of aspirin: Secondary | ICD-10-CM

## 2021-07-10 DIAGNOSIS — A4151 Sepsis due to Escherichia coli [E. coli]: Principal | ICD-10-CM | POA: Diagnosis present

## 2021-07-10 DIAGNOSIS — E210 Primary hyperparathyroidism: Secondary | ICD-10-CM | POA: Diagnosis present

## 2021-07-10 DIAGNOSIS — Z9079 Acquired absence of other genital organ(s): Secondary | ICD-10-CM | POA: Diagnosis not present

## 2021-07-10 DIAGNOSIS — N390 Urinary tract infection, site not specified: Secondary | ICD-10-CM | POA: Diagnosis present

## 2021-07-10 DIAGNOSIS — I10 Essential (primary) hypertension: Secondary | ICD-10-CM | POA: Diagnosis present

## 2021-07-10 DIAGNOSIS — K219 Gastro-esophageal reflux disease without esophagitis: Secondary | ICD-10-CM | POA: Diagnosis present

## 2021-07-10 DIAGNOSIS — C7951 Secondary malignant neoplasm of bone: Secondary | ICD-10-CM | POA: Diagnosis present

## 2021-07-10 DIAGNOSIS — A419 Sepsis, unspecified organism: Secondary | ICD-10-CM | POA: Diagnosis not present

## 2021-07-10 DIAGNOSIS — I4892 Unspecified atrial flutter: Secondary | ICD-10-CM | POA: Diagnosis present

## 2021-07-10 DIAGNOSIS — Z79899 Other long term (current) drug therapy: Secondary | ICD-10-CM

## 2021-07-10 DIAGNOSIS — C61 Malignant neoplasm of prostate: Secondary | ICD-10-CM | POA: Diagnosis not present

## 2021-07-10 DIAGNOSIS — N1831 Chronic kidney disease, stage 3a: Secondary | ICD-10-CM | POA: Diagnosis present

## 2021-07-10 DIAGNOSIS — K2270 Barrett's esophagus without dysplasia: Secondary | ICD-10-CM | POA: Diagnosis present

## 2021-07-10 DIAGNOSIS — E876 Hypokalemia: Secondary | ICD-10-CM | POA: Diagnosis present

## 2021-07-10 DIAGNOSIS — R609 Edema, unspecified: Secondary | ICD-10-CM | POA: Diagnosis not present

## 2021-07-10 DIAGNOSIS — R509 Fever, unspecified: Secondary | ICD-10-CM | POA: Diagnosis not present

## 2021-07-10 DIAGNOSIS — I1310 Hypertensive heart and chronic kidney disease without heart failure, with stage 1 through stage 4 chronic kidney disease, or unspecified chronic kidney disease: Secondary | ICD-10-CM | POA: Diagnosis not present

## 2021-07-10 DIAGNOSIS — Z20822 Contact with and (suspected) exposure to covid-19: Secondary | ICD-10-CM | POA: Diagnosis present

## 2021-07-10 DIAGNOSIS — I4891 Unspecified atrial fibrillation: Secondary | ICD-10-CM | POA: Diagnosis present

## 2021-07-10 DIAGNOSIS — N183 Chronic kidney disease, stage 3 unspecified: Secondary | ICD-10-CM | POA: Diagnosis not present

## 2021-07-10 DIAGNOSIS — I130 Hypertensive heart and chronic kidney disease with heart failure and stage 1 through stage 4 chronic kidney disease, or unspecified chronic kidney disease: Secondary | ICD-10-CM | POA: Diagnosis present

## 2021-07-10 DIAGNOSIS — Z8679 Personal history of other diseases of the circulatory system: Secondary | ICD-10-CM | POA: Diagnosis not present

## 2021-07-10 DIAGNOSIS — Z8701 Personal history of pneumonia (recurrent): Secondary | ICD-10-CM

## 2021-07-10 DIAGNOSIS — N281 Cyst of kidney, acquired: Secondary | ICD-10-CM | POA: Diagnosis not present

## 2021-07-10 DIAGNOSIS — R600 Localized edema: Secondary | ICD-10-CM | POA: Diagnosis not present

## 2021-07-10 DIAGNOSIS — D649 Anemia, unspecified: Secondary | ICD-10-CM | POA: Diagnosis present

## 2021-07-10 DIAGNOSIS — Z8546 Personal history of malignant neoplasm of prostate: Secondary | ICD-10-CM

## 2021-07-10 DIAGNOSIS — Z0389 Encounter for observation for other suspected diseases and conditions ruled out: Secondary | ICD-10-CM | POA: Diagnosis not present

## 2021-07-10 DIAGNOSIS — K5730 Diverticulosis of large intestine without perforation or abscess without bleeding: Secondary | ICD-10-CM | POA: Diagnosis not present

## 2021-07-10 DIAGNOSIS — Z8639 Personal history of other endocrine, nutritional and metabolic disease: Secondary | ICD-10-CM | POA: Diagnosis not present

## 2021-07-10 DIAGNOSIS — R944 Abnormal results of kidney function studies: Secondary | ICD-10-CM | POA: Diagnosis not present

## 2021-07-10 DIAGNOSIS — R5383 Other fatigue: Secondary | ICD-10-CM | POA: Diagnosis not present

## 2021-07-10 DIAGNOSIS — I483 Typical atrial flutter: Secondary | ICD-10-CM | POA: Diagnosis not present

## 2021-07-10 LAB — COMPREHENSIVE METABOLIC PANEL
ALT: 17 U/L (ref 0–44)
AST: 16 U/L (ref 15–41)
Albumin: 3.2 g/dL — ABNORMAL LOW (ref 3.5–5.0)
Alkaline Phosphatase: 72 U/L (ref 38–126)
Anion gap: 10 (ref 5–15)
BUN: 23 mg/dL (ref 8–23)
CO2: 23 mmol/L (ref 22–32)
Calcium: 8.1 mg/dL — ABNORMAL LOW (ref 8.9–10.3)
Chloride: 104 mmol/L (ref 98–111)
Creatinine, Ser: 1.56 mg/dL — ABNORMAL HIGH (ref 0.61–1.24)
GFR, Estimated: 45 mL/min — ABNORMAL LOW (ref >60)
Glucose, Bld: 122 mg/dL — ABNORMAL HIGH (ref 70–99)
Potassium: 3.6 mmol/L (ref 3.5–5.1)
Sodium: 137 mmol/L (ref 135–145)
Total Bilirubin: 0.9 mg/dL (ref 0.3–1.2)
Total Protein: 6.1 g/dL — ABNORMAL LOW (ref 6.5–8.1)

## 2021-07-10 LAB — CBC WITH DIFFERENTIAL/PLATELET
Abs Immature Granulocytes: 0.17 10*3/uL — ABNORMAL HIGH (ref 0.00–0.07)
Basophils Absolute: 0 10*3/uL (ref 0.0–0.1)
Basophils Relative: 0 %
Eosinophils Absolute: 0 10*3/uL (ref 0.0–0.5)
Eosinophils Relative: 0 %
HCT: 34.2 % — ABNORMAL LOW (ref 39.0–52.0)
Hemoglobin: 11.2 g/dL — ABNORMAL LOW (ref 13.0–17.0)
Immature Granulocytes: 1 %
Lymphocytes Relative: 3 %
Lymphs Abs: 0.5 10*3/uL — ABNORMAL LOW (ref 0.7–4.0)
MCH: 31.2 pg (ref 26.0–34.0)
MCHC: 32.7 g/dL (ref 30.0–36.0)
MCV: 95.3 fL (ref 80.0–100.0)
Monocytes Absolute: 1 10*3/uL (ref 0.1–1.0)
Monocytes Relative: 6 %
Neutro Abs: 15.2 10*3/uL — ABNORMAL HIGH (ref 1.7–7.7)
Neutrophils Relative %: 90 %
Platelets: 191 10*3/uL (ref 150–400)
RBC: 3.59 MIL/uL — ABNORMAL LOW (ref 4.22–5.81)
RDW: 14.4 % (ref 11.5–15.5)
WBC: 16.9 10*3/uL — ABNORMAL HIGH (ref 4.0–10.5)
nRBC: 0 % (ref 0.0–0.2)

## 2021-07-10 LAB — APTT: aPTT: 29 seconds (ref 24–36)

## 2021-07-10 LAB — LACTIC ACID, PLASMA
Lactic Acid, Venous: 6.2 mmol/L (ref 0.5–1.9)
Lactic Acid, Venous: 3.3 mmol/L (ref 0.5–1.9)

## 2021-07-10 LAB — RESP PANEL BY RT-PCR (FLU A&B, COVID) ARPGX2
Influenza A by PCR: NEGATIVE
Influenza B by PCR: NEGATIVE
SARS Coronavirus 2 by RT PCR: NEGATIVE

## 2021-07-10 LAB — PROTIME-INR
INR: 1.2 (ref 0.8–1.2)
Prothrombin Time: 15 seconds (ref 11.4–15.2)

## 2021-07-10 MED ORDER — LACTATED RINGERS IV BOLUS (SEPSIS)
1000.0000 mL | Freq: Once | INTRAVENOUS | Status: AC
  Administered 2021-07-10: 1000 mL via INTRAVENOUS

## 2021-07-10 MED ORDER — LACTATED RINGERS IV SOLN: INTRAVENOUS | Status: DC

## 2021-07-10 MED ORDER — IOHEXOL 350 MG/ML SOLN
80.0000 mL | Freq: Once | INTRAVENOUS | Status: AC | PRN
  Administered 2021-07-10: 80 mL via INTRAVENOUS

## 2021-07-10 MED ORDER — LACTATED RINGERS IV BOLUS (SEPSIS)
500.0000 mL | Freq: Once | INTRAVENOUS | Status: AC
  Administered 2021-07-10: 500 mL via INTRAVENOUS

## 2021-07-10 MED ORDER — LACTATED RINGERS IV BOLUS: 2000.0000 mL | Freq: Once | INTRAVENOUS | Status: DC

## 2021-07-10 MED ORDER — ACETAMINOPHEN 325 MG PO TABS
650.0000 mg | ORAL_TABLET | Freq: Once | ORAL | Status: AC
  Administered 2021-07-10: 650 mg via ORAL
  Filled 2021-07-10: qty 2

## 2021-07-10 MED ORDER — SODIUM CHLORIDE 0.9 % IV BOLUS
500.0000 mL | Freq: Once | INTRAVENOUS | Status: AC
  Administered 2021-07-10: 500 mL via INTRAVENOUS

## 2021-07-10 MED ORDER — SODIUM CHLORIDE 0.9 % IV SOLN
2.0000 g | Freq: Once | INTRAVENOUS | Status: AC
  Administered 2021-07-10: 2 g via INTRAVENOUS
  Filled 2021-07-10: qty 2

## 2021-07-10 MED ORDER — METRONIDAZOLE 500 MG/100ML IV SOLN
500.0000 mg | Freq: Once | INTRAVENOUS | Status: AC
  Administered 2021-07-10: 500 mg via INTRAVENOUS
  Filled 2021-07-10: qty 100

## 2021-07-10 NOTE — Progress Notes (Signed)
Elink following for code sepsis 

## 2021-07-10 NOTE — ED Triage Notes (Signed)
Pt reports increased lethargy, body aches, and fever. He at undercooked meat 2 weeks ago and has felt sick after eating since. Finished radiation for 2 spots on pelvis that metastasized last month.

## 2021-07-10 NOTE — Progress Notes (Signed)
Notified bedside nurse of need to administer antibiotics and fluid bolus.  

## 2021-07-10 NOTE — Progress Notes (Signed)
Notified bedside nurse of need to draw repeat lactic acid. 

## 2021-07-10 NOTE — Progress Notes (Signed)
A consult was received from an ED physician for cefepime per pharmacy dosing.  The patient's profile has been reviewed for ht/wt/allergies/indication/available labs.   A one time order has been placed for cefepime 2 g x 1.  Further antibiotics/pharmacy consults should be ordered by admitting physician if indicated.                       Thank you, Napoleon Form 07/10/2021  5:09 PM

## 2021-07-10 NOTE — Progress Notes (Signed)
Notified bedside nurse of need to draw lactic acid and blood cultures.  

## 2021-07-10 NOTE — Progress Notes (Signed)
ED RN states unable to do labs, etc d/t pt having ultrasound

## 2021-07-10 NOTE — ED Provider Notes (Signed)
[facility] DEPT Provider Note   CSN: [id number] Arrival date & time: 07/10/21  1537     History Chief Complaint  Patient presents with   Fever   Generalized Body Aches    Matthew [name]. is a 80 y.o. male.  HPI He presents for evaluation of intermittent vomiting and chills for 2 weeks.  He denies diarrhea, cough, shortness of breath, focal weakness or paresthesia.  He is taking his usual medications.  He feels like his symptoms are aggravated when he eats.  He is here with his wife.  She has been taking his temperature, sporadically, and is found it as high as 102.  No known sick contacts.    Past Medical History:  Diagnosis Date   Barrett's esophagus    Cancer ([place]) 2004   prostate    GERD (gastroesophageal reflux disease)    pt denies   History of kidney stones    Hypertension    Pneumonia    In high school   Prostate cancer [facility])     Patient Active Problem List   Diagnosis Date Noted   Malignant neoplasm of prostate metastatic to bone ([place]) 04/25/2021   Primary hyperparathyroidism ([place]) 02/25/2020   Hyperparathyroidism, primary ([place]) 02/23/2020    Past Surgical History:  Procedure Laterality Date   COLONOSCOPY WITH PROPOFOL N/A 01/09/2016   Procedure: COLONOSCOPY WITH PROPOFOL;  Surgeon: Garlan Fair, MD;  Location: [facility];  Service: Endoscopy;  Laterality: N/A;   CYSTOSCOPY     x 1   ESOPHAGOGASTRODUODENOSCOPY (EGD) WITH PROPOFOL N/A 01/09/2016   Procedure: ESOPHAGOGASTRODUODENOSCOPY (EGD) WITH PROPOFOL;  Surgeon: Garlan Fair, MD;  Location: [facility];  Service: Endoscopy;  Laterality: N/A;   EYE SURGERY     bil cataract   KNEE ARTHROSCOPY Left 25 yrs ago   PARATHYROIDECTOMY N/A 02/25/2020   Procedure: NECK EXPLORATION WITH PARATHYROIDECTOMY;  Surgeon: Armandina Gemma, MD;  Location: [facility];  Service: General;  Laterality: N/A;   PROSTATECTOMY  2004       Family History  Problem Relation Age of Onset   Breast  cancer Neg Hx    Prostate cancer Neg Hx    Colon cancer Neg Hx    Pancreatic cancer Neg Hx     Social History   Tobacco Use   Smoking status: Never   Smokeless tobacco: Never  Vaping Use   Vaping Use: Never used  Substance Use Topics   Alcohol use: Yes    Comment: occasional beer   Drug use: No    Home Medications Prior to Admission medications   Medication Sig Start Date End Date Taking? Authorizing Provider  allopurinol (ZYLOPRIM) 300 MG tablet Take 300 mg by mouth daily.    Yes [provider]  amLODipine (NORVASC) 10 MG tablet Take 10 mg by mouth daily. 02/02/20  Yes [provider]  aspirin EC 325 MG tablet Take 325 mg by mouth daily as needed for mild pain. Prn bilateral chronic knee pain.   Yes [provider]  atorvastatin (LIPITOR) 20 MG tablet Take 20 mg by mouth daily. 02/02/20  Yes [provider]  esomeprazole (NEXIUM) 20 MG capsule Take 20 mg by mouth daily.    Yes [provider]  furosemide (LASIX) 80 MG tablet Take 80 mg by mouth 2 (two) times daily.   Yes [provider]  metoprolol succinate (TOPROL-XL) 100 MG 24 hr tablet Take 100 mg by mouth daily. Take with or immediately following a  meal.   Yes [provider]  Multiple Vitamin (MULTIVITAMIN WITH MINERALS) TABS tablet Take 1 tablet by mouth daily.   Yes [provider]    Allergies    Patient has no known allergies.  Review of Systems   Review of Systems  All other systems reviewed and are negative.  Physical Exam Updated Vital Signs BP (!) 125/92   Pulse 63   Temp 98.6 F (37 C) (Oral)   Resp 15   SpO2 92%   Physical Exam Vitals and nursing note reviewed.  Constitutional:      General: He is not in acute distress.    Appearance: He is well-developed. He is obese. He is not ill-appearing, toxic-appearing or diaphoretic.  HENT:     Head: Normocephalic and atraumatic.     Right Ear: External ear normal.     Left Ear:  External ear normal.     Nose: No congestion or rhinorrhea.     Mouth/Throat:     Pharynx: No oropharyngeal exudate or posterior oropharyngeal erythema.  Eyes:     Conjunctiva/sclera: Conjunctivae normal.     Pupils: Pupils are equal, round, and reactive to light.  Neck:     Trachea: Phonation normal.  Cardiovascular:     Rate and Rhythm: Normal rate and regular rhythm.     Heart sounds: Normal heart sounds.  Pulmonary:     Effort: Pulmonary effort is normal. No respiratory distress.     Breath sounds: Normal breath sounds. No stridor.  Abdominal:     General: There is no distension.     Palpations: Abdomen is soft.     Tenderness: There is no abdominal tenderness.  Musculoskeletal:        General: Normal range of motion.     Cervical back: Normal range of motion and neck supple.  Skin:    General: Skin is warm and dry.  Neurological:     Mental Status: He is alert and oriented to person, place, and time.     Cranial Nerves: No cranial nerve deficit.     Sensory: No sensory deficit.     Motor: No abnormal muscle tone.     Coordination: Coordination normal.  Psychiatric:        Mood and Affect: Mood normal.        Behavior: Behavior normal.        Thought Content: Thought content normal.        Judgment: Judgment normal.    ED Results / Procedures / Treatments   Labs (all labs ordered are listed, but only abnormal results are displayed) Labs Reviewed  LACTIC ACID, PLASMA - Abnormal; Notable for the following components:      Result Value   Lactic Acid, Venous 3.3 (*)    All other components within normal limits  LACTIC ACID, PLASMA - Abnormal; Notable for the following components:   Lactic Acid, Venous 6.2 (*)    All other components within normal limits  COMPREHENSIVE METABOLIC PANEL - Abnormal; Notable for the following components:   Glucose, Bld 122 (*)    Creatinine, Ser 1.56 (*)    Calcium 8.1 (*)    Total Protein 6.1 (*)    Albumin 3.2 (*)    GFR, Estimated  45 (*)    All other components within normal limits  CBC WITH DIFFERENTIAL/PLATELET - Abnormal; Notable for the following components:   WBC 16.9 (*)    RBC 3.59 (*)    Hemoglobin 11.2 (*)  HCT 34.2 (*)    Neutro Abs 15.2 (*)    Lymphs Abs 0.5 (*)    Abs Immature Granulocytes 0.17 (*)    All other components within normal limits  RESP PANEL BY RT-PCR (FLU A&B, COVID) ARPGX2  CULTURE, BLOOD (ROUTINE X 2)  CULTURE, BLOOD (ROUTINE X 2)  PROTIME-INR  APTT  URINALYSIS, ROUTINE W REFLEX MICROSCOPIC    EKG None   Date: 07/10/21  Rate: 67  Rhythm: atrial flutter  QRS Axis: normal  PR and QT Intervals:  normal QT  ST/T Wave abnormalities: nonspecific ST changes  PR and QRS Conduction Disutrbances:none  Narrative Interpretation:   Old EKG Reviewed: changes noted   Radiology US Abdomen Complete  Result Date: 07/10/2021 CLINICAL DATA:  Sepsis EXAM: ABDOMEN ULTRASOUND COMPLETE COMPARISON:  None. FINDINGS: Gallbladder: No gallstones or wall thickening visualized. No sonographic Murphy sign noted by sonographer. Common bile duct: Diameter: Normal caliber, 6 mm. Liver: No focal lesion identified. Within normal limits in parenchymal echogenicity. Portal vein is patent on color Doppler imaging with normal direction of blood flow towards the liver. IVC: No abnormality visualized. Pancreas: Visualized portion unremarkable. Spleen: Size and appearance within normal limits. Right Kidney: Length: Not visualized. Left Kidney: Length: 15.6 cm. Normal echotexture. Small cysts, the largest measuring 3.2 cm. Abdominal aorta: No aneurysm. Other findings: None. IMPRESSION: No acute findings. Electronically Signed   By: Rolm Baptise M.D.   On: 07/10/2021 18:48   CT Abdomen Pelvis W Contrast  Result Date: 07/10/2021 CLINICAL DATA:  Abdominal abscess/infection suspected Patient reports fever, lethargy and body aches. History of prostate cancer post radiation for bony lesions. EXAM: CT ABDOMEN AND PELVIS WITH  CONTRAST TECHNIQUE: Multidetector CT imaging of the abdomen and pelvis was performed using the standard protocol following bolus administration of intravenous contrast. CONTRAST:  40m OMNIPAQUE IOHEXOL 350 MG/ML SOLN COMPARISON:  Abdominal ultrasound earlier today. PET CT 05/23/2021, abdominal CT 04/04/2021. Abdominal MRI 04/19/2021 FINDINGS: Lower chest: Descending thoracic aorta is tortuous. There is dependent atelectasis in the medial aspect of both lower lobes. Small to moderate-sized hiatal hernia. There is minimal basilar septal thickening. Trace pleural effusions. Hepatobiliary: No focal liver abnormality is seen. No gallstones, gallbladder wall thickening, or biliary dilatation. Pancreas: No ductal dilatation or inflammation. No evidence of pancreatic mass. Spleen: There is scattered low-density lesions throughout the spleen, largest centrally measures 11 mm, other lesions are subcentimeter. Overall normal in size. Adrenals/Urinary Tract: No adrenal nodule. Chronic right renal parenchymal atrophy. There are innumerable lesions of the right kidney likely representing simple and complex renal cysts. Slight dilatation of the right renal pelvis with ureteral thickening. Nonobstructing calculi in the lower right kidney measuring up to 14 mm. There is slight compensatory hypertrophy of the left kidney. Multiple simple and complex cysts throughout the left kidney. Slight dilatation of the left renal pelvis with mild left ureteral dilatation. No obstructing stone. There is a nonobstructing 5 mm stone in the lower left kidney. Partially distended urinary bladder. No bladder wall thickening Stomach/Bowel: Bowel assessment is limited in the absence of enteric contrast. Small to moderate hiatal hernia. Stomach is nondistended further limiting assessment. There is no small bowel obstruction or inflammation. Appendix is tentatively but not definitively identified and normal. Small volume of colonic stool. Colonic  diverticulosis involving the descending and sigmoid colon. No diverticulitis. No colonic inflammation. Vascular/Lymphatic: Aortic atherosclerosis and tortuosity. No aortic aneurysm. Patent portal vein. No enlarged lymph nodes in the abdomen or pelvis. Reproductive: Prostatectomy. Other: Fat in the left greater  than right inguinal canals. No ascites or free air. No abscess or abdominopelvic collection. Musculoskeletal: Stable appearance of sclerotic lesion in the right ischium. Tiny sclerotic lesion in the right acetabulum is unchanged. No acute fracture or new osseous lesion. Diffuse degenerative change in the spine IMPRESSION: 1. No acute abnormality or explanation for abdominal pain. 2. Colonic diverticulosis without diverticulitis. 3. Chronic right renal parenchymal atrophy. Bilateral nonobstructing renal calculi. Bilateral simple and complex renal cysts, recently assessed on MRI. 4. Multiple low-density lesions throughout the spleen, largest centrally measures 11 mm. These are typically benign, and not seen on prior noncontrast exams. 5. Stable sclerotic lesions in the right ischium and right acetabulum. 6. Small to moderate-sized hiatal hernia. 7. Minimal basilar septal thickening at the lung bases, may represent mild pulmonary edema. Trace pleural effusions. Aortic Atherosclerosis (ICD10-I70.0). Electronically Signed   By: Keith Rake M.D.   On: 07/10/2021 21:53   DG Chest Port 1 View  Result Date: 07/10/2021 CLINICAL DATA:  Questionable sepsis EXAM: PORTABLE CHEST 1 VIEW COMPARISON:  11/23/2006 FINDINGS: Heart is borderline in size. Tortuosity of the thoracic aorta. No confluent airspace opacities or effusions. No acute bony abnormality. IMPRESSION: Borderline heart size.  Tortuous aorta.  No active disease. Electronically Signed   By: Rolm Baptise M.D.   On: 07/10/2021 17:45    Procedures Procedures   Medications Ordered in ED Medications  lactated ringers infusion ( Intravenous New Bag/Given  07/10/21 2111)  sodium chloride 0.9 % bolus 500 mL (has no administration in time range)  lactated ringers bolus 1,000 mL (0 mLs Intravenous Stopped 07/10/21 2225)    And  lactated ringers bolus 1,000 mL (0 mLs Intravenous Stopped 07/10/21 2225)    And  lactated ringers bolus 1,000 mL (1,000 mLs Intravenous New Bag/Given 07/10/21 2021)    And  lactated ringers bolus 500 mL (0 mLs Intravenous Stopped 07/10/21 2039)  metroNIDAZOLE (FLAGYL) IVPB 500 mg (0 mg Intravenous Stopped 07/10/21 2001)  acetaminophen (TYLENOL) tablet 650 mg (650 mg Oral Given 07/10/21 1813)  ceFEPIme (MAXIPIME) 2 g in sodium chloride 0.9 % 100 mL IVPB (0 g Intravenous Stopped 07/10/21 1914)  iohexol (OMNIPAQUE) 350 MG/ML injection 80 mL (80 mLs Intravenous Contrast Given 07/10/21 2141)    ED Course  I have reviewed the triage vital signs and the nursing notes.  Pertinent labs & imaging results that were available during my care of the patient were reviewed by me and considered in my medical decision making (see chart for details).  Clinical Course as of 07/10/21 2337  Mon Jul 10, 2021  2332 Discussed with Intensivist, Dr. Carson Myrtle.  He recommends admit to hospital service, give gentle fluids, and observe expectantly on antibiotics. [EW]    Clinical Course User Index [EW] Daleen Bo, MD   MDM Rules/Calculators/A&P                            Patient Vitals for the past 24 hrs:  BP Temp Temp src Pulse Resp SpO2  07/10/21 2330 (!) 125/92 -- -- 63 15 92 %  07/10/21 2315 113/72 -- -- 64 16 92 %  07/10/21 2300 112/77 -- -- 63 14 91 %  07/10/21 2230 105/75 -- -- 60 (!) 23 91 %  07/10/21 2215 106/71 -- -- 62 (!) 25 91 %  07/10/21 2201 116/73 -- -- 64 (!) 25 92 %  07/10/21 2030 (!) 122/59 -- -- 79 (!) 26 92 %  07/10/21 2018 --  98.6 F (37 C) Oral -- -- --  07/10/21 2015 115/69 -- -- 66 (!) 25 93 %  07/10/21 1945 114/67 -- -- 67 (!) 26 93 %  07/10/21 1930 113/66 -- -- 65 (!) 24 93 %  07/10/21 1915 113/66 -- -- 68 (!) 25 93 %   07/10/21 1830 109/61 -- -- 70 18 93 %  07/10/21 1800 109/69 -- -- 79 18 92 %  07/10/21 1745 (!) 92/57 -- -- 81 -- (!) 89 %  07/10/21 1730 126/67 -- -- 80 -- 91 %  07/10/21 1715 117/67 -- -- 82 -- 91 %  07/10/21 1700 123/68 -- -- 71 -- 92 %  07/10/21 1645 123/65 -- -- 81 -- 91 %  07/10/21 1633 126/65 (!) 102.5 F (39.2 C) Oral 74 18 93 %  07/10/21 1548 137/77 (!) 102.8 F (39.3 C) Oral 88 18 92 %    11:14 PM Reevaluation with update and discussion. After initial assessment and treatment, an updated evaluation reveals he remains comfortable and has defervesced.  He has urinated about 500 cc recently.  Prescription finding and plan with wife and patient. Daleen Bo   Medical Decision Making:  This patient is presenting for evaluation of vomiting and chills for 2-week, which does require a range of treatment options, and is a complaint that involves a high risk of morbidity and mortality. The differential diagnoses include sepsis, gallbladder disease, intestinal disorder. I decided to review old records, and in summary elderly male, being treated for prostate cancer with metastases, presenting with sepsis.  He has a history of primary hyper parathyroidism, malignant cancer of the prostate..  I obtained additional historical information from wife at bedside.  Clinical Laboratory Tests Ordered, included  sepsis bundle . Review indicates normal except white count high, initial lactate 3.3, repeat lactate increased to 6.2, glucose high, creatinine high, calcium low, total protein low, albumin low, GFR low. Radiologic Tests Ordered, included abdominal ultrasound, CT abdomen pelvis.  I independently Visualized: Radiograph images, which show no acute abnormalities  Cardiac Monitor Tracing which shows normal sinus rhythm    Critical Interventions-clinical evaluation, empiric IV fluid bolus, empiric antibiotics for abdominal sepsis, abdominal ultrasound imaging, observation and  reassessment  After These Interventions, the Patient was reevaluated and was found with climbing lactate despite receiving IV lactated Ringer's, 30 cc/kg bolus.  Patient was treated with empiric antibiotics, cefepime and Flagyl.  Unclear cause of rising lactate, with fever, no source found.  No evidence for bowel ischemia, urinary tract infection, pneumonia.  No noted skin or soft tissue lesions/infection.  Plan on admission for overnight observation and empiric antibiotics, pending cultures and expectant management.  Patient does not meet criteria for severe sepsis at this time, despite having climbed lactate, his vital signs are reassuring as well as his urinary output, and overall clinical status.  CRITICAL CARE-yes Performed by: Daleen Bo  Nursing Notes Reviewed/ Care Coordinated Applicable Imaging Reviewed Interpretation of Laboratory Data incorporated into ED treatment  11:37 PM-Consult complete with hospitalist. Patient case explained and discussed.  He agrees to admit patient for further evaluation and treatment. Call ended at 11:47 PM    Final Clinical Impression(s) / ED Diagnoses Final diagnoses:  Sepsis without acute organ dysfunction, due to unspecified organism [facility])    Rx / [place] Orders ED Discharge Orders     None        Daleen Bo, MD 07/10/21 2353

## 2021-07-10 NOTE — ED Notes (Signed)
US bedside

## 2021-07-11 ENCOUNTER — Encounter (HOSPITAL_COMMUNITY): Payer: Self-pay | Admitting: Internal Medicine

## 2021-07-11 ENCOUNTER — Inpatient Hospital Stay (HOSPITAL_COMMUNITY): Payer: Medicare PPO

## 2021-07-11 ENCOUNTER — Other Ambulatory Visit: Payer: Self-pay

## 2021-07-11 DIAGNOSIS — Z923 Personal history of irradiation: Secondary | ICD-10-CM | POA: Diagnosis not present

## 2021-07-11 DIAGNOSIS — Z8639 Personal history of other endocrine, nutritional and metabolic disease: Secondary | ICD-10-CM

## 2021-07-11 DIAGNOSIS — A419 Sepsis, unspecified organism: Secondary | ICD-10-CM | POA: Diagnosis not present

## 2021-07-11 DIAGNOSIS — I4892 Unspecified atrial flutter: Secondary | ICD-10-CM

## 2021-07-11 DIAGNOSIS — C61 Malignant neoplasm of prostate: Secondary | ICD-10-CM

## 2021-07-11 DIAGNOSIS — R6510 Systemic inflammatory response syndrome (SIRS) of non-infectious origin without acute organ dysfunction: Secondary | ICD-10-CM | POA: Diagnosis present

## 2021-07-11 DIAGNOSIS — D649 Anemia, unspecified: Secondary | ICD-10-CM

## 2021-07-11 DIAGNOSIS — Z8701 Personal history of pneumonia (recurrent): Secondary | ICD-10-CM | POA: Diagnosis not present

## 2021-07-11 DIAGNOSIS — E876 Hypokalemia: Secondary | ICD-10-CM | POA: Diagnosis present

## 2021-07-11 DIAGNOSIS — C7951 Secondary malignant neoplasm of bone: Secondary | ICD-10-CM

## 2021-07-11 DIAGNOSIS — Z8546 Personal history of malignant neoplasm of prostate: Secondary | ICD-10-CM

## 2021-07-11 DIAGNOSIS — R609 Edema, unspecified: Secondary | ICD-10-CM | POA: Diagnosis not present

## 2021-07-11 DIAGNOSIS — I4891 Unspecified atrial fibrillation: Secondary | ICD-10-CM | POA: Diagnosis present

## 2021-07-11 DIAGNOSIS — R600 Localized edema: Secondary | ICD-10-CM | POA: Diagnosis not present

## 2021-07-11 DIAGNOSIS — N390 Urinary tract infection, site not specified: Secondary | ICD-10-CM | POA: Diagnosis present

## 2021-07-11 DIAGNOSIS — I10 Essential (primary) hypertension: Secondary | ICD-10-CM | POA: Diagnosis not present

## 2021-07-11 DIAGNOSIS — Z87442 Personal history of urinary calculi: Secondary | ICD-10-CM | POA: Diagnosis not present

## 2021-07-11 DIAGNOSIS — E892 Postprocedural hypoparathyroidism: Secondary | ICD-10-CM | POA: Diagnosis present

## 2021-07-11 DIAGNOSIS — Z79899 Other long term (current) drug therapy: Secondary | ICD-10-CM | POA: Diagnosis not present

## 2021-07-11 DIAGNOSIS — A4151 Sepsis due to Escherichia coli [E. coli]: Secondary | ICD-10-CM | POA: Diagnosis present

## 2021-07-11 DIAGNOSIS — Z9079 Acquired absence of other genital organ(s): Secondary | ICD-10-CM | POA: Diagnosis not present

## 2021-07-11 DIAGNOSIS — R944 Abnormal results of kidney function studies: Secondary | ICD-10-CM

## 2021-07-11 DIAGNOSIS — N183 Chronic kidney disease, stage 3 unspecified: Secondary | ICD-10-CM

## 2021-07-11 DIAGNOSIS — I130 Hypertensive heart and chronic kidney disease with heart failure and stage 1 through stage 4 chronic kidney disease, or unspecified chronic kidney disease: Secondary | ICD-10-CM | POA: Diagnosis present

## 2021-07-11 DIAGNOSIS — Z8679 Personal history of other diseases of the circulatory system: Secondary | ICD-10-CM

## 2021-07-11 DIAGNOSIS — K2270 Barrett's esophagus without dysplasia: Secondary | ICD-10-CM | POA: Diagnosis present

## 2021-07-11 DIAGNOSIS — Z7982 Long term (current) use of aspirin: Secondary | ICD-10-CM | POA: Diagnosis not present

## 2021-07-11 DIAGNOSIS — I5032 Chronic diastolic (congestive) heart failure: Secondary | ICD-10-CM | POA: Diagnosis present

## 2021-07-11 DIAGNOSIS — K219 Gastro-esophageal reflux disease without esophagitis: Secondary | ICD-10-CM | POA: Diagnosis present

## 2021-07-11 DIAGNOSIS — Z20822 Contact with and (suspected) exposure to covid-19: Secondary | ICD-10-CM | POA: Diagnosis present

## 2021-07-11 DIAGNOSIS — N1831 Chronic kidney disease, stage 3a: Secondary | ICD-10-CM | POA: Diagnosis present

## 2021-07-11 LAB — CBC WITH DIFFERENTIAL/PLATELET
Abs Immature Granulocytes: 0.14 10*3/uL — ABNORMAL HIGH (ref 0.00–0.07)
Basophils Absolute: 0 10*3/uL (ref 0.0–0.1)
Basophils Relative: 0 %
Eosinophils Absolute: 0.2 10*3/uL (ref 0.0–0.5)
Eosinophils Relative: 1 %
HCT: 37.8 % — ABNORMAL LOW (ref 39.0–52.0)
Hemoglobin: 12.1 g/dL — ABNORMAL LOW (ref 13.0–17.0)
Immature Granulocytes: 1 %
Lymphocytes Relative: 11 %
Lymphs Abs: 1.7 10*3/uL (ref 0.7–4.0)
MCH: 31 pg (ref 26.0–34.0)
MCHC: 32 g/dL (ref 30.0–36.0)
MCV: 96.9 fL (ref 80.0–100.0)
Monocytes Absolute: 1 10*3/uL (ref 0.1–1.0)
Monocytes Relative: 7 %
Neutro Abs: 12.1 10*3/uL — ABNORMAL HIGH (ref 1.7–7.7)
Neutrophils Relative %: 80 %
Platelets: 211 10*3/uL (ref 150–400)
RBC: 3.9 MIL/uL — ABNORMAL LOW (ref 4.22–5.81)
RDW: 14.6 % (ref 11.5–15.5)
WBC: 15.1 10*3/uL — ABNORMAL HIGH (ref 4.0–10.5)
nRBC: 0 % (ref 0.0–0.2)

## 2021-07-11 LAB — MAGNESIUM: Magnesium: 2 mg/dL (ref 1.7–2.4)

## 2021-07-11 LAB — COMPREHENSIVE METABOLIC PANEL
ALT: 16 U/L (ref 0–44)
AST: 13 U/L — ABNORMAL LOW (ref 15–41)
Albumin: 2.9 g/dL — ABNORMAL LOW (ref 3.5–5.0)
Alkaline Phosphatase: 63 U/L (ref 38–126)
Anion gap: 8 (ref 5–15)
BUN: 20 mg/dL (ref 8–23)
CO2: 26 mmol/L (ref 22–32)
Calcium: 8.2 mg/dL — ABNORMAL LOW (ref 8.9–10.3)
Chloride: 106 mmol/L (ref 98–111)
Creatinine, Ser: 1.38 mg/dL — ABNORMAL HIGH (ref 0.61–1.24)
GFR, Estimated: 52 mL/min — ABNORMAL LOW (ref >60)
Glucose, Bld: 116 mg/dL — ABNORMAL HIGH (ref 70–99)
Potassium: 3.5 mmol/L (ref 3.5–5.1)
Sodium: 140 mmol/L (ref 135–145)
Total Bilirubin: 0.9 mg/dL (ref 0.3–1.2)
Total Protein: 5.7 g/dL — ABNORMAL LOW (ref 6.5–8.1)

## 2021-07-11 LAB — CBC
HCT: 37 % — ABNORMAL LOW (ref 39.0–52.0)
Hemoglobin: 12 g/dL — ABNORMAL LOW (ref 13.0–17.0)
MCH: 31.1 pg (ref 26.0–34.0)
MCHC: 32.4 g/dL (ref 30.0–36.0)
MCV: 95.9 fL (ref 80.0–100.0)
Platelets: 193 10*3/uL (ref 150–400)
RBC: 3.86 MIL/uL — ABNORMAL LOW (ref 4.22–5.81)
RDW: 14.5 % (ref 11.5–15.5)
WBC: 16.4 10*3/uL — ABNORMAL HIGH (ref 4.0–10.5)
nRBC: 0 % (ref 0.0–0.2)

## 2021-07-11 LAB — ECHOCARDIOGRAM COMPLETE
Calc EF: 55.8 %
Height: 72 in
S' Lateral: 3.8 cm
Single Plane A2C EF: 59.8 %
Single Plane A4C EF: 52 %
Weight: 3855.4 oz

## 2021-07-11 LAB — URINALYSIS, ROUTINE W REFLEX MICROSCOPIC
Bilirubin Urine: NEGATIVE
Glucose, UA: NEGATIVE mg/dL
Hgb urine dipstick: NEGATIVE
Ketones, ur: NEGATIVE mg/dL
Nitrite: NEGATIVE
Protein, ur: NEGATIVE mg/dL
Specific Gravity, Urine: 1.019 (ref 1.005–1.030)
pH: 6 (ref 5.0–8.0)

## 2021-07-11 LAB — APTT: aPTT: 34 seconds (ref 24–36)

## 2021-07-11 LAB — MRSA NEXT GEN BY PCR, NASAL: MRSA by PCR Next Gen: NOT DETECTED

## 2021-07-11 LAB — PROCALCITONIN: Procalcitonin: 18.21 ng/mL

## 2021-07-11 LAB — PROTIME-INR
INR: 1.2 (ref 0.8–1.2)
Prothrombin Time: 15.1 seconds (ref 11.4–15.2)

## 2021-07-11 LAB — LACTIC ACID, PLASMA
Lactic Acid, Venous: 0.9 mmol/L (ref 0.5–1.9)
Lactic Acid, Venous: 0.9 mmol/L (ref 0.5–1.9)

## 2021-07-11 LAB — CREATININE, SERUM
Creatinine, Ser: 1.34 mg/dL — ABNORMAL HIGH (ref 0.61–1.24)
GFR, Estimated: 54 mL/min — ABNORMAL LOW (ref >60)

## 2021-07-11 MED ORDER — ENOXAPARIN SODIUM 40 MG/0.4ML IJ SOSY
40.0000 mg | PREFILLED_SYRINGE | INTRAMUSCULAR | Status: DC
  Administered 2021-07-11: 40 mg via SUBCUTANEOUS
  Filled 2021-07-11: qty 0.4

## 2021-07-11 MED ORDER — ACETAMINOPHEN 650 MG RE SUPP: 650.0000 mg | Freq: Four times a day (QID) | RECTAL | Status: DC | PRN

## 2021-07-11 MED ORDER — METOPROLOL TARTRATE 25 MG PO TABS
25.0000 mg | ORAL_TABLET | Freq: Two times a day (BID) | ORAL | Status: DC
  Administered 2021-07-11 – 2021-07-12 (×2): 25 mg via ORAL
  Filled 2021-07-11 (×3): qty 1

## 2021-07-11 MED ORDER — POTASSIUM CHLORIDE CRYS ER 20 MEQ PO TBCR
40.0000 meq | EXTENDED_RELEASE_TABLET | ORAL | Status: AC
Start: 2021-07-11 — End: 2021-07-11
  Administered 2021-07-11 (×2): 40 meq via ORAL
  Filled 2021-07-11 (×2): qty 2

## 2021-07-11 MED ORDER — ASPIRIN EC 325 MG PO TBEC: 325.0000 mg | DELAYED_RELEASE_TABLET | Freq: Every day | ORAL | Status: DC | PRN

## 2021-07-11 MED ORDER — SODIUM CHLORIDE 0.9 % IV SOLN: INTRAVENOUS | Status: DC

## 2021-07-11 MED ORDER — ALLOPURINOL 300 MG PO TABS
300.0000 mg | ORAL_TABLET | Freq: Every day | ORAL | Status: DC
  Administered 2021-07-11 – 2021-07-12 (×2): 300 mg via ORAL
  Filled 2021-07-11: qty 1
  Filled 2021-07-11: qty 3

## 2021-07-11 MED ORDER — ACETAMINOPHEN 325 MG PO TABS
650.0000 mg | ORAL_TABLET | Freq: Four times a day (QID) | ORAL | Status: DC | PRN
  Administered 2021-07-11: 650 mg via ORAL
  Filled 2021-07-11: qty 2

## 2021-07-11 MED ORDER — ATORVASTATIN CALCIUM 20 MG PO TABS
20.0000 mg | ORAL_TABLET | Freq: Every day | ORAL | Status: DC
  Administered 2021-07-11 – 2021-07-12 (×2): 20 mg via ORAL
  Filled 2021-07-11: qty 2
  Filled 2021-07-11: qty 1

## 2021-07-11 MED ORDER — METOPROLOL SUCCINATE ER 25 MG PO TB24: 100.0000 mg | ORAL_TABLET | Freq: Every day | ORAL | Status: DC

## 2021-07-11 MED ORDER — PIPERACILLIN-TAZOBACTAM 3.375 G IVPB
3.3750 g | Freq: Three times a day (TID) | INTRAVENOUS | Status: DC
  Administered 2021-07-11 – 2021-07-12 (×5): 3.375 g via INTRAVENOUS
  Filled 2021-07-11 (×5): qty 50

## 2021-07-11 MED ORDER — ASPIRIN EC 81 MG PO TBEC: 81.0000 mg | DELAYED_RELEASE_TABLET | Freq: Every day | ORAL | Status: DC | PRN

## 2021-07-11 MED ORDER — CHLORHEXIDINE GLUCONATE CLOTH 2 % EX PADS
6.0000 | MEDICATED_PAD | Freq: Every day | CUTANEOUS | Status: DC
  Administered 2021-07-11 – 2021-07-12 (×3): 6 via TOPICAL

## 2021-07-11 MED ORDER — PANTOPRAZOLE SODIUM 40 MG PO TBEC
40.0000 mg | DELAYED_RELEASE_TABLET | Freq: Every day | ORAL | Status: DC
  Administered 2021-07-11 – 2021-07-12 (×2): 40 mg via ORAL
  Filled 2021-07-11 (×2): qty 1

## 2021-07-11 MED ORDER — FUROSEMIDE 10 MG/ML IJ SOLN
40.0000 mg | Freq: Two times a day (BID) | INTRAMUSCULAR | Status: DC
  Administered 2021-07-11 – 2021-07-12 (×2): 40 mg via INTRAVENOUS
  Filled 2021-07-11 (×2): qty 4

## 2021-07-11 MED ORDER — AMLODIPINE BESYLATE 10 MG PO TABS: 10.0000 mg | ORAL_TABLET | Freq: Every day | ORAL | Status: DC

## 2021-07-11 MED ORDER — APIXABAN 5 MG PO TABS
5.0000 mg | ORAL_TABLET | Freq: Two times a day (BID) | ORAL | Status: DC
  Administered 2021-07-11 – 2021-07-12 (×3): 5 mg via ORAL
  Filled 2021-07-11 (×3): qty 1

## 2021-07-11 MED ORDER — LABETALOL HCL 5 MG/ML IV SOLN: 10.0000 mg | INTRAVENOUS | Status: DC | PRN

## 2021-07-11 NOTE — H&P (Signed)
History and Physical    Matthew [name]. GU:[id number] DOB: [birth year] DOA: 07/10/2021  PCP: Wenda Low, MD  Patient coming from: Home.  Chief Complaint: Fever.  HPI: Matthew [name]. is a 80 y.o. male with history of prostate cancer and recently completed radiation course, hypertension, chronic lower extremity edema on Lasix, chronic kidney disease stage III presents to the ER because of intermittent fever.  Patient states last week about 7 days ago patient had a bacon following which patient had nausea vomiting and fever chills.  Since then he has been having intermittent fever particularly when he eats anything.  Denies any further episodes of vomiting.  Denies any abdominal pain chest pain shortness of breath diarrhea.  Denies any recent travel or insect bites or sick contacts.  ED Course: In the ER patient was febrile hypotensive with temperature of 102 F lactic acid initially was 3 worsened to 6 patient was given fluid bolus following which blood pressure improved.  Chest x-ray was unremarkable COVID test was negative CT scan of the abdomen pelvis, sonogram of the abdomen chest x-ray did not show anything acute.  Patient's UA shows features concerning for UTI.  Patient started on empiric antibiotic admitted for SIRS with possible developing sepsis source could be UTI.  Review of Systems: As per HPI, rest all negative.   Past Medical History:  Diagnosis Date   Barrett's esophagus    Cancer ([place]) 2004   prostate    GERD (gastroesophageal reflux disease)    pt denies   History of kidney stones    Hypertension    Pneumonia    In high school   Prostate cancer [facility])     Past Surgical History:  Procedure Laterality Date   COLONOSCOPY WITH PROPOFOL N/A 01/09/2016   Procedure: COLONOSCOPY WITH PROPOFOL;  Surgeon: Garlan Fair, MD;  Location: [facility];  Service: Endoscopy;  Laterality: N/A;   CYSTOSCOPY     x 1   ESOPHAGOGASTRODUODENOSCOPY (EGD) WITH PROPOFOL N/A 01/09/2016    Procedure: ESOPHAGOGASTRODUODENOSCOPY (EGD) WITH PROPOFOL;  Surgeon: Garlan Fair, MD;  Location: [facility];  Service: Endoscopy;  Laterality: N/A;   EYE SURGERY     bil cataract   KNEE ARTHROSCOPY Left 25 yrs ago   PARATHYROIDECTOMY N/A 02/25/2020   Procedure: NECK EXPLORATION WITH PARATHYROIDECTOMY;  Surgeon: Armandina Gemma, MD;  Location: [facility] ORS;  Service: General;  Laterality: N/A;   PROSTATECTOMY  2004     reports that he has never smoked. He has never used smokeless tobacco. He reports current alcohol use. He reports that he does not use drugs.  No Known Allergies  Family History  Problem Relation Age of Onset   Breast cancer Neg Hx    Prostate cancer Neg Hx    Colon cancer Neg Hx    Pancreatic cancer Neg Hx     Prior to Admission medications   Medication Sig Start Date End Date Taking? Authorizing Provider  allopurinol (ZYLOPRIM) 300 MG tablet Take 300 mg by mouth daily.    Yes [provider]  amLODipine (NORVASC) 10 MG tablet Take 10 mg by mouth daily. 02/02/20  Yes [provider]  aspirin EC 325 MG tablet Take 325 mg by mouth daily as needed for mild pain. Prn bilateral chronic knee pain.   Yes [provider]  atorvastatin (LIPITOR) 20 MG tablet Take 20 mg by mouth daily. 02/02/20  Yes [provider]  esomeprazole (NEXIUM) 20 MG capsule Take 20 mg by  mouth daily.    Yes [provider]  furosemide (LASIX) 80 MG tablet Take 80 mg by mouth 2 (two) times daily.   Yes [provider]  metoprolol succinate (TOPROL-XL) 100 MG 24 hr tablet Take 100 mg by mouth daily. Take with or immediately following a meal.   Yes [provider]  Multiple Vitamin (MULTIVITAMIN WITH MINERALS) TABS tablet Take 1 tablet by mouth daily.   Yes [provider]    Physical Exam: Constitutional: Moderately built and nourished. Vitals:   07/10/21 2315 07/10/21 2330 07/11/21 0000 07/11/21 0100  BP: 113/72 (!) 125/92 120/70  119/81  Pulse: 64 63 65 63  Resp: 16 15 (!) 23 18  Temp:      TempSrc:      SpO2: 92% 92% 96% 97%   Eyes: Anicteric no pallor. ENMT: No discharge from the ears eyes nose and mouth. Neck: No mass felt.  No neck rigidity. Respiratory: No rhonchi or crepitations. Cardiovascular: S1-S2 heard. Abdomen: Soft nontender bowel sound present. Musculoskeletal: Bilateral lower extremity edema present. Skin: No obvious rash. Neurologic: Alert awake oriented to time place and person.  Moves all extremities. Psychiatric: Appears normal.  Normal affect.   Labs on Admission: I have personally reviewed following labs and imaging studies  CBC: Recent Labs  Lab 07/10/21 1930  WBC 16.9*  NEUTROABS 15.2*  HGB 11.2*  HCT 34.2*  MCV 95.3  PLT 99991111   Basic Metabolic Panel: Recent Labs  Lab 07/10/21 1930  NA 137  K 3.6  CL 104  CO2 23  GLUCOSE 122*  BUN 23  CREATININE 1.56*  CALCIUM 8.1*   GFR: CrCl cannot be calculated (Unknown ideal weight.). Liver Function Tests: Recent Labs  Lab 07/10/21 1930  AST 16  ALT 17  ALKPHOS 72  BILITOT 0.9  PROT 6.1*  ALBUMIN 3.2*   No results for input(s): LIPASE, AMYLASE in the last 168 hours. No results for input(s): AMMONIA in the last 168 hours. Coagulation Profile: Recent Labs  Lab 07/10/21 1930  INR 1.2   Cardiac Enzymes: No results for input(s): CKTOTAL, CKMB, CKMBINDEX, TROPONINI in the last 168 hours. BNP (last 3 results) No results for input(s): PROBNP in the last 8760 hours. HbA1C: No results for input(s): HGBA1C in the last 72 hours. CBG: No results for input(s): GLUCAP in the last 168 hours. Lipid Profile: No results for input(s): CHOL, HDL, LDLCALC, TRIG, CHOLHDL, LDLDIRECT in the last 72 hours. Thyroid Function Tests: No results for input(s): TSH, T4TOTAL, FREET4, T3FREE, THYROIDAB in the last 72 hours. Anemia Panel: No results for input(s): VITAMINB12, FOLATE, FERRITIN, TIBC, IRON, RETICCTPCT in the last 72  hours. Urine analysis:    Component Value Date/Time   COLORURINE YELLOW 07/10/2021 2330   APPEARANCEUR HAZY (A) 07/10/2021 2330   LABSPEC 1.019 07/10/2021 2330   PHURINE 6.0 07/10/2021 2330   GLUCOSEU NEGATIVE 07/10/2021 2330   HGBUR NEGATIVE 07/10/2021 2330   BILIRUBINUR NEGATIVE 07/10/2021 2330   KETONESUR NEGATIVE 07/10/2021 2330   PROTEINUR NEGATIVE 07/10/2021 2330   NITRITE NEGATIVE 07/10/2021 2330   LEUKOCYTESUR LARGE (A) 07/10/2021 2330   Sepsis Labs: '@LABRCNTIP'$ (procalcitonin:4,lacticidven:4) ) Recent Results (from the past 240 hour(s))  Resp Panel by RT-PCR (Flu A&B, Covid) Nasopharyngeal Swab     Status: None   Collection Time: 07/10/21  6:40 PM   Specimen: Nasopharyngeal Swab; Nasopharyngeal(NP) swabs in vial transport medium  Result Value Ref Range Status   SARS Coronavirus 2 by RT PCR NEGATIVE NEGATIVE Final  Comment: (NOTE) SARS-CoV-2 target nucleic acids are NOT DETECTED.  The SARS-CoV-2 RNA is generally detectable in upper respiratory specimens during the acute phase of infection. The lowest concentration of SARS-CoV-2 viral copies this assay can detect is 138 copies/mL. A negative result does not preclude SARS-Cov-2 infection and should not be used as the sole basis for treatment or other patient management decisions. A negative result may occur with  improper specimen collection/handling, submission of specimen other than nasopharyngeal swab, presence of viral mutation(s) within the areas targeted by this assay, and inadequate number of viral copies(<138 copies/mL). A negative result must be combined with clinical observations, patient history, and epidemiological information. The expected result is Negative.  Fact Sheet for Patients:  EntrepreneurPulse.com.au  Fact Sheet for Healthcare Providers:  IncredibleEmployment.be  This test is no t yet approved or cleared by the [place] FDA and  has been authorized  for detection and/or diagnosis of SARS-CoV-2 by FDA under an Emergency Use Authorization (EUA). This EUA will remain  in effect (meaning this test can be used) for the duration of the COVID-19 declaration under Section 564(b)(1) of the Act, 21 U.S.C.section 360bbb-3(b)(1), unless the authorization is terminated  or revoked sooner.       Influenza A by PCR NEGATIVE NEGATIVE Final   Influenza B by PCR NEGATIVE NEGATIVE Final    Comment: (NOTE) The Xpert Xpress SARS-CoV-2/FLU/RSV plus assay is intended as an aid in the diagnosis of influenza from Nasopharyngeal swab specimens and should not be used as a sole basis for treatment. Nasal washings and aspirates are unacceptable for Xpert Xpress SARS-CoV-2/FLU/RSV testing.  Fact Sheet for Patients: EntrepreneurPulse.com.au  Fact Sheet for Healthcare Providers: IncredibleEmployment.be  This test is not yet approved or cleared by the [place] FDA and has been authorized for detection and/or diagnosis of SARS-CoV-2 by FDA under an Emergency Use Authorization (EUA). This EUA will remain in effect (meaning this test can be used) for the duration of the COVID-19 declaration under Section 564(b)(1) of the Act, 21 U.S.C. section 360bbb-3(b)(1), unless the authorization is terminated or revoked.  Performed at [facility], [place] [address], [place],  [address]      Radiological Exams on Admission: US Abdomen Complete  Result Date: 07/10/2021 CLINICAL DATA:  Sepsis EXAM: ABDOMEN ULTRASOUND COMPLETE COMPARISON:  None. FINDINGS: Gallbladder: No gallstones or wall thickening visualized. No sonographic Murphy sign noted by sonographer. Common bile duct: Diameter: Normal caliber, 6 mm. Liver: No focal lesion identified. Within normal limits in parenchymal echogenicity. Portal vein is patent on color Doppler imaging with normal direction of blood flow towards the liver. IVC: No  abnormality visualized. Pancreas: Visualized portion unremarkable. Spleen: Size and appearance within normal limits. Right Kidney: Length: Not visualized. Left Kidney: Length: 15.6 cm. Normal echotexture. Small cysts, the largest measuring 3.2 cm. Abdominal aorta: No aneurysm. Other findings: None. IMPRESSION: No acute findings. Electronically Signed   By: Rolm Baptise M.D.   On: 07/10/2021 18:48   CT Abdomen Pelvis W Contrast  Result Date: 07/10/2021 CLINICAL DATA:  Abdominal abscess/infection suspected Patient reports fever, lethargy and body aches. History of prostate cancer post radiation for bony lesions. EXAM: CT ABDOMEN AND PELVIS WITH CONTRAST TECHNIQUE: Multidetector CT imaging of the abdomen and pelvis was performed using the standard protocol following bolus administration of intravenous contrast. CONTRAST:  60m OMNIPAQUE IOHEXOL 350 MG/ML SOLN COMPARISON:  Abdominal ultrasound earlier today. PET CT 05/23/2021, abdominal CT 04/04/2021. Abdominal MRI 04/19/2021 FINDINGS: Lower chest: Descending thoracic aorta is tortuous.  There is dependent atelectasis in the medial aspect of both lower lobes. Small to moderate-sized hiatal hernia. There is minimal basilar septal thickening. Trace pleural effusions. Hepatobiliary: No focal liver abnormality is seen. No gallstones, gallbladder wall thickening, or biliary dilatation. Pancreas: No ductal dilatation or inflammation. No evidence of pancreatic mass. Spleen: There is scattered low-density lesions throughout the spleen, largest centrally measures 11 mm, other lesions are subcentimeter. Overall normal in size. Adrenals/Urinary Tract: No adrenal nodule. Chronic right renal parenchymal atrophy. There are innumerable lesions of the right kidney likely representing simple and complex renal cysts. Slight dilatation of the right renal pelvis with ureteral thickening. Nonobstructing calculi in the lower right kidney measuring up to 14 mm. There is slight compensatory  hypertrophy of the left kidney. Multiple simple and complex cysts throughout the left kidney. Slight dilatation of the left renal pelvis with mild left ureteral dilatation. No obstructing stone. There is a nonobstructing 5 mm stone in the lower left kidney. Partially distended urinary bladder. No bladder wall thickening Stomach/Bowel: Bowel assessment is limited in the absence of enteric contrast. Small to moderate hiatal hernia. Stomach is nondistended further limiting assessment. There is no small bowel obstruction or inflammation. Appendix is tentatively but not definitively identified and normal. Small volume of colonic stool. Colonic diverticulosis involving the descending and sigmoid colon. No diverticulitis. No colonic inflammation. Vascular/Lymphatic: Aortic atherosclerosis and tortuosity. No aortic aneurysm. Patent portal vein. No enlarged lymph nodes in the abdomen or pelvis. Reproductive: Prostatectomy. Other: Fat in the left greater than right inguinal canals. No ascites or free air. No abscess or abdominopelvic collection. Musculoskeletal: Stable appearance of sclerotic lesion in the right ischium. Tiny sclerotic lesion in the right acetabulum is unchanged. No acute fracture or new osseous lesion. Diffuse degenerative change in the spine IMPRESSION: 1. No acute abnormality or explanation for abdominal pain. 2. Colonic diverticulosis without diverticulitis. 3. Chronic right renal parenchymal atrophy. Bilateral nonobstructing renal calculi. Bilateral simple and complex renal cysts, recently assessed on MRI. 4. Multiple low-density lesions throughout the spleen, largest centrally measures 11 mm. These are typically benign, and not seen on prior noncontrast exams. 5. Stable sclerotic lesions in the right ischium and right acetabulum. 6. Small to moderate-sized hiatal hernia. 7. Minimal basilar septal thickening at the lung bases, may represent mild pulmonary edema. Trace pleural effusions. Aortic  Atherosclerosis (ICD10-I70.0). Electronically Signed   By: Keith Rake M.D.   On: 07/10/2021 21:53   DG Chest Port 1 View  Result Date: 07/10/2021 CLINICAL DATA:  Questionable sepsis EXAM: PORTABLE CHEST 1 VIEW COMPARISON:  11/23/2006 FINDINGS: Heart is borderline in size. Tortuosity of the thoracic aorta. No confluent airspace opacities or effusions. No acute bony abnormality. IMPRESSION: Borderline heart size.  Tortuous aorta.  No active disease. Electronically Signed   By: Rolm Baptise M.D.   On: 07/10/2021 17:45    EKG: Independently reviewed.  Poor quality we will repeat EKG.  Assessment/Plan Principal Problem:   SIRS (systemic inflammatory response syndrome) (HCC) Active Problems:   Hyperparathyroidism, primary (HCC)   Malignant neoplasm of prostate metastatic to bone [facility])   Essential hypertension    SIRS with possible delving sepsis source could be UTI -on empiric antibiotics follow cultures.  Continue hydration.  Follow lactic acid procalcitonin levels. History of hypertension holding antihypertensives due to hypotensive on presentation.  As needed IV labetalol for now. Anemia appears to be new.  Follow CBC check anemia panel with next blood draw. History of hyperparathyroidism. Chronic kidney disease stage III creatinine mildly  elevated from baseline.  Follow metabolic panel after hydration. History of prostate cancer being followed by urologist Dr. Tresa Moore.  Has had recent radiation therapy. Chronic lower extremity edema on Lasix which is on hold due to hypotension and sepsis picture.   DVT prophylaxis: Lovenox. Code Status: Full code. Family Communication: Discussed with patient. Disposition Plan: Home. Consults called: None. Admission status: Observation.   Rise Patience MD [facility] Pager [phone].  If 7PM-7AM, please contact night-coverage [email or web address] Password [email or web address]  07/11/2021, 1:59 AM

## 2021-07-11 NOTE — Progress Notes (Signed)
  Echocardiogram 2D Echocardiogram has been performed.  Matthew [name] 07/11/2021, 2:40 PM

## 2021-07-11 NOTE — Progress Notes (Signed)
EKG obtained. Cardiac rhythm atrial flutter. EKG obtained in ER also read atrial flutter. Hospitalist notified.

## 2021-07-11 NOTE — Progress Notes (Signed)
Received pt from ICU/SD, ambulated to the bed.Spouse at bedside. Pt denies pain. Tele in place, VSs in CHL. No acute distress. SRP, RN

## 2021-07-11 NOTE — Consult Note (Addendum)
CARDIOLOGY CONSULT NOTE  Patient ID: Matthew [name]. MRN: [id number] DOB/AGE: Sep 23, 1941 80 y.o.  Admit date: 07/10/2021 Referring Physician: [facility] hospitalist Reason for Consultation: Atrial flutter  HPI:   80 y.o. Caucasian male  with hypertension, h/o prostate cancer s/p prostatectomy and radiation, h/o Barrett's esophagus, chronic leg edema, now admitted with possible SIRS/allergic reaction.  Cardiology consulted for management of atrial flutter.  Patient presented to [facility] on 07/10/2021 after second bout of chills, fever, generalized rash.  This episode occurred after eating hamburger yesterday.  Similar episode occurred 10 days prior after eating, what he thought was a bad piece of bacon.  Patient was found to be febrile up to 102 F, lactic acid elevation up to 6, both are now resolving.  Working diagnosis is possible allergic reaction versus SIRS.  EKG showed atrial flutter with variable AV block.  Patient is on Metroprolol succinate 100 mg daily at home, currently getting 25 mg twice daily of ventricular tartrate.  Rate is well controlled in 60s-70s.  Patient works as a Optometrist for [employer], works from home.  He used to be physically active, works out regularly in [employer] until [employer] started.  Since then, his physical activity has plummeted.  He has a stationary bicycle, but does not use it regularly.  Most strenuous activity he now does is working in the yard.  At baseline, he does endorse exertional dyspnea, but denies any chest pain.  He has had chronic leg edema, for which she has been on Lasix 80 mg p.o. twice daily without significant improvement.  Compression stockings seem to help leg edema.  He does not have known history of obstructive sleep apnea.    Past Medical History:  Diagnosis Date   Barrett's esophagus    Cancer ([place]) 2004   prostate    GERD (gastroesophageal reflux disease)    pt denies   History of kidney stones    Hypertension    Pneumonia     In high school   Prostate cancer [facility])      Past Surgical History:  Procedure Laterality Date   COLONOSCOPY WITH PROPOFOL N/A 01/09/2016   Procedure: COLONOSCOPY WITH PROPOFOL;  Surgeon: Garlan Fair, MD;  Location: [facility] END[facility]COPY;  Service: Endoscopy;  Laterality: N/A;   CYSTOSCOPY     x 1   ESOPHAGOGASTRODUODENOSCOPY (EGD) WITH PROPOFOL N/A 01/09/2016   Procedure: ESOPHAGOGASTRODUODENOSCOPY (EGD) WITH PROPOFOL;  Surgeon: Garlan Fair, MD;  Location: [facility];  Service: Endoscopy;  Laterality: N/A;   EYE SURGERY     bil cataract   KNEE ARTHROSCOPY Left 25 yrs ago   PARATHYROIDECTOMY N/A 02/25/2020   Procedure: NECK EXPLORATION WITH PARATHYROIDECTOMY;  Surgeon: Armandina Gemma, MD;  Location: [facility] ORS;  Service: General;  Laterality: N/A;   PROSTATECTOMY  2004      Family History  Problem Relation Age of Onset   Breast cancer Neg Hx    Prostate cancer Neg Hx    Colon cancer Neg Hx    Pancreatic cancer Neg Hx      Social History: Social History   Socioeconomic History   Marital status: Married    Spouse name: Not on file   Number of children: Not on file   Years of education: Not on file   Highest education level: Not on file  Occupational History   Not on file  Tobacco Use   Smoking status: Never   Smokeless tobacco: Never  Vaping Use   Vaping Use: Never  used  Substance and Sexual Activity   Alcohol use: Yes    Comment: occasional beer   Drug use: No   Sexual activity: Not Currently  Other Topics Concern   Not on file  Social History Narrative   Not on file   Social Determinants of Health   Financial Resource Strain: Not on file  Food Insecurity: Not on file  Transportation Needs: Not on file  Physical Activity: Not on file  Stress: Not on file  Social Connections: Not on file  Intimate Partner Violence: Not on file     Medications Prior to Admission  Medication Sig Dispense Refill Last Dose   allopurinol (ZYLOPRIM) 300 MG tablet Take 300 mg by  mouth daily.    07/10/2021   amLODipine (NORVASC) 10 MG tablet Take 10 mg by mouth daily.   07/10/2021   aspirin EC 325 MG tablet Take 325 mg by mouth daily as needed for mild pain. Prn bilateral chronic knee pain.   06/27/2021 at 10 am   atorvastatin (LIPITOR) 20 MG tablet Take 20 mg by mouth daily.   07/10/2021   esomeprazole (NEXIUM) 20 MG capsule Take 20 mg by mouth daily.    07/10/2021   furosemide (LASIX) 80 MG tablet Take 80 mg by mouth 2 (two) times daily.   07/10/2021   metoprolol succinate (TOPROL-XL) 100 MG 24 hr tablet Take 100 mg by mouth daily. Take with or immediately following a meal.   07/10/2021 at 10 am   Multiple Vitamin (MULTIVITAMIN WITH MINERALS) TABS tablet Take 1 tablet by mouth daily.   07/10/2021    Review of Systems  Constitutional: Positive for chills and fever. Negative for decreased appetite, malaise/fatigue, weight gain and weight loss.  HENT:  Negative for congestion.   Eyes:  Negative for visual disturbance.  Cardiovascular:  Positive for leg swelling. Negative for chest pain, dyspnea on exertion, palpitations and syncope.  Respiratory:  Negative for cough.   Endocrine: Negative for cold intolerance.  Hematologic/Lymphatic: Does not bruise/bleed easily.  Skin:  Positive for rash. Negative for itching.  Musculoskeletal:  Negative for myalgias.  Gastrointestinal:  Negative for abdominal pain, nausea and vomiting.  Genitourinary:  Negative for dysuria.  Neurological:  Negative for dizziness and weakness.  Psychiatric/Behavioral:  The patient is not nervous/anxious.   All other systems reviewed and are negative.    Physical Exam: Physical Exam Vitals and nursing note reviewed.  Constitutional:      General: He is not in acute distress.    Appearance: He is well-developed. He is obese.  HENT:     Head: Normocephalic and atraumatic.  Eyes:     Conjunctiva/sclera: Conjunctivae normal.     Pupils: Pupils are equal, round, and reactive to light.  Neck:     Vascular:  No JVD.  Cardiovascular:     Rate and Rhythm: Normal rate. Rhythm irregular.     Pulses: Normal pulses and intact distal pulses.     Heart sounds: No murmur heard. Pulmonary:     Effort: Pulmonary effort is normal.     Breath sounds: Normal breath sounds. No wheezing or rales.  Abdominal:     General: Bowel sounds are normal.     Palpations: Abdomen is soft.     Tenderness: There is no rebound.  Musculoskeletal:        General: No tenderness. Normal range of motion.     Right lower leg: Edema (2+) present.     Left lower leg: Edema (2+) present.  Lymphadenopathy:     Cervical: No cervical adenopathy.  Skin:    General: Skin is warm and dry.  Neurological:     Mental Status: He is alert and oriented to person, place, and time.     Cranial Nerves: No cranial nerve deficit.     Labs:   Lab Results  Component Value Date   WBC 15.1 (H) 07/11/2021   HGB 12.1 (L) 07/11/2021   HCT 37.8 (L) 07/11/2021   MCV 96.9 07/11/2021   PLT 211 07/11/2021    Recent Labs  Lab 07/11/21 0527  NA 140  K 3.5  CL 106  CO2 26  BUN 20  CREATININE 1.38*  CALCIUM 8.2*  PROT 5.7*  BILITOT 0.9  ALKPHOS 63  ALT 16  AST 13*  GLUCOSE 116*     Radiology: US Abdomen Complete  Result Date: 07/10/2021 CLINICAL DATA:  Sepsis EXAM: ABDOMEN ULTRASOUND COMPLETE COMPARISON:  None. FINDINGS: Gallbladder: No gallstones or wall thickening visualized. No sonographic Murphy sign noted by sonographer. Common bile duct: Diameter: Normal caliber, 6 mm. Liver: No focal lesion identified. Within normal limits in parenchymal echogenicity. Portal vein is patent on color Doppler imaging with normal direction of blood flow towards the liver. IVC: No abnormality visualized. Pancreas: Visualized portion unremarkable. Spleen: Size and appearance within normal limits. Right Kidney: Length: Not visualized. Left Kidney: Length: 15.6 cm. Normal echotexture. Small cysts, the largest measuring 3.2 cm. Abdominal aorta: No  aneurysm. Other findings: None. IMPRESSION: No acute findings. Electronically Signed   By: Rolm Baptise M.D.   On: 07/10/2021 18:48   CT Abdomen Pelvis W Contrast  Result Date: 07/10/2021 CLINICAL DATA:  Abdominal abscess/infection suspected Patient reports fever, lethargy and body aches. History of prostate cancer post radiation for bony lesions. EXAM: CT ABDOMEN AND PELVIS WITH CONTRAST TECHNIQUE: Multidetector CT imaging of the abdomen and pelvis was performed using the standard protocol following bolus administration of intravenous contrast. CONTRAST:  70m OMNIPAQUE IOHEXOL 350 MG/ML SOLN COMPARISON:  Abdominal ultrasound earlier today. PET CT 05/23/2021, abdominal CT 04/04/2021. Abdominal MRI 04/19/2021 FINDINGS: Lower chest: Descending thoracic aorta is tortuous. There is dependent atelectasis in the medial aspect of both lower lobes. Small to moderate-sized hiatal hernia. There is minimal basilar septal thickening. Trace pleural effusions. Hepatobiliary: No focal liver abnormality is seen. No gallstones, gallbladder wall thickening, or biliary dilatation. Pancreas: No ductal dilatation or inflammation. No evidence of pancreatic mass. Spleen: There is scattered low-density lesions throughout the spleen, largest centrally measures 11 mm, other lesions are subcentimeter. Overall normal in size. Adrenals/Urinary Tract: No adrenal nodule. Chronic right renal parenchymal atrophy. There are innumerable lesions of the right kidney likely representing simple and complex renal cysts. Slight dilatation of the right renal pelvis with ureteral thickening. Nonobstructing calculi in the lower right kidney measuring up to 14 mm. There is slight compensatory hypertrophy of the left kidney. Multiple simple and complex cysts throughout the left kidney. Slight dilatation of the left renal pelvis with mild left ureteral dilatation. No obstructing stone. There is a nonobstructing 5 mm stone in the lower left kidney. Partially  distended urinary bladder. No bladder wall thickening Stomach/Bowel: Bowel assessment is limited in the absence of enteric contrast. Small to moderate hiatal hernia. Stomach is nondistended further limiting assessment. There is no small bowel obstruction or inflammation. Appendix is tentatively but not definitively identified and normal. Small volume of colonic stool. Colonic diverticulosis involving the descending and sigmoid colon. No diverticulitis. No colonic inflammation. Vascular/Lymphatic: Aortic atherosclerosis and tortuosity.  No aortic aneurysm. Patent portal vein. No enlarged lymph nodes in the abdomen or pelvis. Reproductive: Prostatectomy. Other: Fat in the left greater than right inguinal canals. No ascites or free air. No abscess or abdominopelvic collection. Musculoskeletal: Stable appearance of sclerotic lesion in the right ischium. Tiny sclerotic lesion in the right acetabulum is unchanged. No acute fracture or new osseous lesion. Diffuse degenerative change in the spine IMPRESSION: 1. No acute abnormality or explanation for abdominal pain. 2. Colonic diverticulosis without diverticulitis. 3. Chronic right renal parenchymal atrophy. Bilateral nonobstructing renal calculi. Bilateral simple and complex renal cysts, recently assessed on MRI. 4. Multiple low-density lesions throughout the spleen, largest centrally measures 11 mm. These are typically benign, and not seen on prior noncontrast exams. 5. Stable sclerotic lesions in the right ischium and right acetabulum. 6. Small to moderate-sized hiatal hernia. 7. Minimal basilar septal thickening at the lung bases, may represent mild pulmonary edema. Trace pleural effusions. Aortic Atherosclerosis (ICD10-I70.0). Electronically Signed   By: Keith Rake M.D.   On: 07/10/2021 21:53   DG Chest Port 1 View  Result Date: 07/10/2021 CLINICAL DATA:  Questionable sepsis EXAM: PORTABLE CHEST 1 VIEW COMPARISON:  11/23/2006 FINDINGS: Heart is borderline in  size. Tortuosity of the thoracic aorta. No confluent airspace opacities or effusions. No acute bony abnormality. IMPRESSION: Borderline heart size.  Tortuous aorta.  No active disease. Electronically Signed   By: Rolm Baptise M.D.   On: 07/10/2021 17:45    Scheduled Meds:  allopurinol  300 mg Oral Daily   atorvastatin  20 mg Oral Daily   Chlorhexidine Gluconate Cloth  6 each Topical Daily   enoxaparin (LOVENOX) injection  40 mg Subcutaneous Q24H   pantoprazole  40 mg Oral Daily   Continuous Infusions:  sodium chloride Stopped (07/11/21 1005)   piperacillin-tazobactam (ZOSYN)  IV 12.5 mL/hr at 07/11/21 1120   PRN Meds:.acetaminophen **OR** acetaminophen, aspirin EC, labetalol  CARDIAC STUDIES:  EKG 07/11/2021: Atrial flutter with 4:1 AV block  Echocardiogram: Pending   Assessment & Recommendations:  80 y.o. Caucasian male  with hypertension, h/o prostate cancer s/p prostatectomy and radiation, h/o Barrett's esophagus, chronic leg edema, now admitted with possible SIRS/allergic reaction.  Cardiology consulted for management of atrial flutter.  Atrial flutter: Variable AV conduction, ventricular rate in 70s.  Patient is on metoprolol succinate 100 mg daily at home, currently getting metoprolol tartrate 25 mg twice daily. Rate is well controlled.  He potentially could be discharged on metoprolol succinate 50 mg daily subjective heart rate and blood pressure. CHA2DS2VASc score at least 3. Recommend anticoagulation with eliquis 5 mg bid. Stop Aspirin. Recommend echocardiogram. I suspect systolic/diastolic heart fialure, given his exertional dyspnea, leg edema.  He is on lasix 80 mg PO bid at home. Recommend at least IV lasix 40 mg bid while inpatient.  Can consider outpatient stress test and sleep study.   Management of other acute medical issues as per the primary team.       Nigel Mormon, MD Pager: [phone] Office: [phone]

## 2021-07-11 NOTE — Progress Notes (Signed)
ANTICOAGULATION CONSULT NOTE - Initial Consult  Pharmacy Consult for apixaban Indication: atrial fibrillation  No Known Allergies  Patient Measurements: Height: 6' (182.9 cm) Weight: 109.3 kg (240 lb 15.4 oz) IBW/kg (Calculated) : 77.6   Vital Signs: Temp: 97.5 F (36.4 C) (08/09 1200) Temp Source: Oral (08/09 1200) BP: 127/74 (08/09 1100) Pulse Rate: 67 (08/09 1100)  Labs: Recent Labs    07/10/21 1930 07/11/21 0259 07/11/21 0527  HGB 11.2* 12.0* 12.1*  HCT 34.2* 37.0* 37.8*  PLT 191 193 211  APTT 29  --  34  LABPROT 15.0  --  15.1  INR 1.2  --  1.2  CREATININE 1.56* 1.34* 1.38*    Estimated Creatinine Clearance: 55.4 mL/min (A) (by C-G formula based on SCr of 1.38 mg/dL (H)).   Medical History: Past Medical History:  Diagnosis Date   Barrett's esophagus    Cancer ([place]) 2004   prostate    GERD (gastroesophageal reflux disease)    pt denies   History of kidney stones    Hypertension    Pneumonia    In high school   Prostate cancer ([place])     Medications:  Scheduled:   allopurinol  300 mg Oral Daily   atorvastatin  20 mg Oral Daily   Chlorhexidine Gluconate Cloth  6 each Topical Daily   enoxaparin (LOVENOX) injection  40 mg Subcutaneous Q24H   metoprolol tartrate  25 mg Oral BID   pantoprazole  40 mg Oral Daily   potassium chloride  40 mEq Oral Q4H   Infusions:   piperacillin-tazobactam (ZOSYN)  IV 12.5 mL/hr at 07/11/21 1120    Assessment: 11 yoM admitted on 8/8 with fever, urosepsis.  Found to be in atrial flutter.  Pharmacy is consulted to dose apixaban.  No PTA anticoagulation.  Has been on prophylactic Lovenox since admission.   SCr 1.38 CBC: Hgb stable at 12.1, Plt WNL  Goal of Therapy:  Monitor platelets by anticoagulation protocol: Yes   Plan:  D/C Lovenox Apixaban 5 mg PO BID Pharmacy to provide education prior to discharge.    Gretta Arab PharmD, BCPS Clinical Pharmacist [facility] pharmacy [phone] 07/11/2021 12:50 PM

## 2021-07-11 NOTE — TOC Initial Note (Signed)
Transition of Care (TOC) - Initial/Assessment Note    Patient Details  Name: Matthew [name]. MRN: [id number] Date of Birth: 1941-03-28  Transition of Care [facility]) CM/SW Contact:    Leeroy Cha, RN Phone Number: 07/11/2021, 8:25 AM  Clinical Narrative:                 80 y.o. male with history of prostate cancer and recently completed radiation course, hypertension, chronic lower extremity edema on Lasix, chronic kidney disease stage III presents to the ER because of intermittent fever.  Patient states last week about 7 days ago patient had a bacon following which patient had nausea vomiting and fever chills.  Since then he has been having intermittent fever particularly when he eats anything.  Denies any further episodes of vomiting.  Denies any abdominal pain chest pain shortness of breath diarrhea.  Denies any recent travel or insect bites or sick contacts.   ED Course: In the ER patient was febrile hypotensive with temperature of 102 F lactic acid initially was 3 worsened to 6 patient was given fluid bolus following which blood pressure improved.  Chest x-ray was unremarkable COVID test was negative CT scan of the abdomen pelvis, sonogram of the abdomen chest x-ray did not show anything acute.  Patient's UA shows features concerning for UTI.  Patient started on empiric antibiotic admitted for SIRS with possible developing sepsis source could be UTI. TOC PLAN OF CARE:  from home plan is to return to home and spouse for self care.  Following for progression. Expected Discharge Plan: Home/Self Care Barriers to Discharge: Continued Medical Work up   Patient Goals and CMS Choice Patient states their goals for this hospitalization and ongoing recovery are:: going home      Expected Discharge Plan and Services Expected Discharge Plan: Home/Self Care   Discharge Planning Services: CM Consult   Living arrangements for the past 2 months: Single Family Home                                       Prior Living Arrangements/Services Living arrangements for the past 2 months: Single Family Home Lives with:: Spouse Patient language and need for interpreter reviewed:: Yes Do you feel safe going back to the place where you live?: Yes            Criminal Activity/Legal Involvement Pertinent to Current Situation/Hospitalization: No - Comment as needed  Activities of Daily Living Home Assistive Devices/Equipment: Eyeglasses ADL Screening (condition at time of admission) Patient's cognitive ability adequate to safely complete daily activities?: Yes Is the patient deaf or have difficulty hearing?: No Does the patient have difficulty seeing, even when wearing glasses/contacts?: No Does the patient have difficulty concentrating, remembering, or making decisions?: No Patient able to express need for assistance with ADLs?: Yes Does the patient have difficulty dressing or bathing?: No Independently performs ADLs?: Yes (appropriate for developmental age) Does the patient have difficulty walking or climbing stairs?: Yes (secondary to weakness) Weakness of Legs: Both Weakness of Arms/Hands: None  Permission Sought/Granted                  Emotional Assessment       Orientation: : Oriented to Self, Oriented to Place, Oriented to  Time, Oriented to Situation Alcohol / Substance Use: Not Applicable Psych Involvement: No (comment)  Admission diagnosis:  SIRS (systemic inflammatory response syndrome) ([place]) [R65.10]  Sepsis without acute organ dysfunction, due to unspecified organism [facility]) [A41.9] Patient Active Problem List   Diagnosis Date Noted   Essential hypertension 07/11/2021   SIRS (systemic inflammatory response syndrome) ([place]) 07/10/2021   Malignant neoplasm of prostate metastatic to bone [facility]) 04/25/2021   Primary hyperparathyroidism ([place]) 02/25/2020   Hyperparathyroidism, primary ([place]) 02/23/2020   PCP:  Wenda Low, MD Pharmacy:   [facility] - [address], [place] [address] Phone: [phone] Fax: [phone]  [id number]: [phone]: [phone]    Social Determinants of Health (SDOH) Interventions    Readmission Risk Interventions No flowsheet data found.

## 2021-07-11 NOTE — Plan of Care (Signed)

## 2021-07-11 NOTE — Progress Notes (Signed)
Pharmacy Antibiotic Note  Matthew [name]. is a 80 y.o. male admitted on 07/10/2021 with sepsis.  Pharmacy has been consulted for Zosyn dosing.  CTAngio = colonic diverticulosis without diverticulitis  Plan: Zosyn 3.375g IV q8h (4 hour infusion). Follow renal function F/U culture and sensitivities     Temp (24hrs), Avg:101.3 F (38.5 C), Min:98.6 F (37 C), Max:102.8 F (39.3 C)  Recent Labs  Lab 07/10/21 1901 07/10/21 1930  WBC  --  16.9*  CREATININE  --  1.56*  LATICACIDVEN 6.2* 3.3*    CrCl cannot be calculated (Unknown ideal weight.).   CrCl ~ 39 ml/min with SCr = 1.56  No Known Allergies  Antimicrobials this admission: 8/8 Cefepime x 1 8/8 Metronidazole x 1 8/9 Zosyn >>      Dose adjustments this admission:    Microbiology results: 8/8 BCx:   8/9 MRSA PCR:    Thank you for allowing pharmacy to be a part of this patient's care.  Everette Rank, PharmD 07/11/2021 2:11 AM

## 2021-07-11 NOTE — Progress Notes (Signed)
PROGRESS NOTE  Matthew [name]. GU:[id number] DOB: [birth year]   PCP: Wenda Low, MD  Patient is from: Home.  Independently ambulates at baseline.  Lives with his wife.  DOA: 07/10/2021 LOS: 0  Chief complaints:  Chief Complaint  Patient presents with   Fever   Generalized Body Aches     Brief Narrative / Interim history: 80 year old M with history of prostate cancer s/p radiation, HTN, chronic LE edema on Lasix and CKD-3 presenting with intermittent fever.  Patient had some nausea, vomiting, fever and chills 10 days ago after he had a bacon.  He had another episode this past Saturday after eating but not the same meal.  He continued to spike fever intermittently.  He had 1 episode of emesis prior to arrival.  Emesis was nonbloody.  Also continued to have some chills around and "skin bumps" after eating.  Has no cardiopulmonary symptoms, abdominal pain, diarrhea or UTI symptoms.  In ED, had SIRS with fever to 102.8, RR to mid 20s and WBC to 17 with left shift.  Not tachycardic.  Initial lactic acid 6.2 but improved to 3.3.  COVID-19 and influenza PCR nonreactive.  CT abdomen and pelvis without significant finding to explain patient's symptoms.  EKG with atrial flutter but no RVR.  CXR Borderline heart size, shortness or, but no active disease.  Abdominal ultrasound negative.   Cultures obtained.  Patient was started on IV Zosyn, and admitted with SIRS and possible UTI.  Subjective: Seen and examined earlier this morning.  No major events overnight of this morning.  No complaint this morning.  He had breakfast about an hour ago.  Has not had any reaction from breakfast yet.  He denies chest pain, dyspnea, palpitation, dizziness, GI or UTI symptoms.  Objective: Vitals:   07/11/21 1000 07/11/21 1004 07/11/21 1100 07/11/21 1200  BP:  123/83 127/74   Pulse: 68 67 67   Resp: '20 13 19   '$ Temp:    (!) 97.5 F (36.4 C)  TempSrc:    Oral  SpO2: 96% 96% 94%   Weight:      Height:         Intake/Output Summary (Last 24 hours) at 07/11/2021 1219 Last data filed at 07/11/2021 1120 Gross per 24 hour  Intake 5704.35 ml  Output 375 ml  Net 5329.35 ml   Filed Weights   07/11/21 0211  Weight: 109.3 kg    Examination:  GENERAL: No apparent distress.  Nontoxic. HEENT: MMM.  Vision and hearing grossly intact.  NECK: Supple.  No apparent JVD.  RESP: On RA.  No IWOB.  Fair aeration bilaterally. CVS:  RRR. Heart sounds normal.  ABD/GI/GU: BS+. Abd soft, NTND.  MSK/EXT:  Moves extremities. No apparent deformity.  1+ pitting edema bilaterally. SKIN: no apparent skin lesion or wound NEURO: Awake, alert and oriented appropriately.  No apparent focal neuro deficit. PSYCH: Calm. Normal affect.  Procedures:  None  Microbiology summarized: [id number] and influenza PCR nonreactive. Blood cultures NGTD. Urine culture pending.  Assessment & Plan: SIRS-unclear source of infection yet.  Work-up including CXR, CT a/p, abdominal US and blood cultures unrevealing.  UA with pyuria but no UTI symptoms.  He has leukocytosis with left shift suggesting bacterial etiology or demargination.  No eosinophilia to suggest allergic reaction.  Leukocytosis improved.  Not dyshidrosis resolved.  Procalcitonin is elevated. -Continue IV Zosyn until cultures are negative at least for 48 hours -Follow cultures  New atrial flutter/atrial fibrillation-patient denies history of arrhythmia or heart  disease.  He is on metoprolol for "blood pressure" and Lasix for leg swelling.  Initial EKG with atrial flutter.  He seems to have A. fib on telemetry now.  He is rate controlled with HR in low 60s.  This could be provoked by the above.  -Resume beta-blocker with metoprolol 25 mg twice daily -He needs anticoagulation moving forward [facility] cardiology consulted, and will see patient. -Echocardiogram -Check TSH -Keep K> 4 and Mg > 2.0.    Essential hypertension: Normotensive. -Metoprolol as above -Recommend  discontinuing amlodipine given his leg edema.  Bilateral lower extremity edema-has 1+ pitting edema bilaterally.  On Lasix at home. -Discontinue IV fluid -Avoid amlodipine  Hypokalemia: K3.5.  Improved. -K-Dur 40 mill equivalent x2 -Check magnesium  CKD-3A: Cr improved. Recent Labs    07/10/21 1930 07/11/21 0259 07/11/21 0527  BUN 23  --  20  CREATININE 1.56* 1.34* 1.38*  -Continue monitoring  Postprandial chills/skin rash-allergic reaction?  No further reaction after breakfast so far.  Abdominal exam is benign. -Will use Pepcid +/- Benadryl if further reaction.  History of prostate cancer: Recently completed radiation therapy. -Outpatient follow-up  Body mass index is 32.68 kg/m.         DVT prophylaxis:  enoxaparin (LOVENOX) injection 40 mg Start: 07/11/21 1000  Code Status: Full code Family Communication: Patient and/or RN. Available if any question.  Level of care: Stepdown.  Will transfer to progressive care Status is: Observation  The patient will require care spanning > 2 midnights and should be moved to inpatient because: Persistent severe electrolyte disturbances, Ongoing diagnostic testing needed not appropriate for outpatient work up, IV treatments appropriate due to intensity of illness or inability to take PO, and Inpatient level of care appropriate due to severity of illness  Dispo: The patient is from: Home              Anticipated d/c is to: Home              Patient currently is not medically stable to d/c.   Difficult to place patient No       Consultants:  Cardiology   Sch Meds:  Scheduled Meds:  allopurinol  300 mg Oral Daily   atorvastatin  20 mg Oral Daily   Chlorhexidine Gluconate Cloth  6 each Topical Daily   enoxaparin (LOVENOX) injection  40 mg Subcutaneous Q24H   metoprolol tartrate  25 mg Oral BID   pantoprazole  40 mg Oral Daily   potassium chloride  40 mEq Oral Q4H   Continuous Infusions:  piperacillin-tazobactam (ZOSYN)   IV 12.5 mL/hr at 07/11/21 1120   PRN Meds:.acetaminophen **OR** acetaminophen, aspirin EC, labetalol  Antimicrobials: Anti-infectives (From admission, onward)    Start     Dose/Rate Route Frequency Ordered Stop   07/11/21 0300  piperacillin-tazobactam (ZOSYN) IVPB 3.375 g        3.375 g 12.5 mL/hr over 240 Minutes Intravenous Every 8 hours 07/11/21 0209     07/10/21 1715  metroNIDAZOLE (FLAGYL) IVPB 500 mg        500 mg 100 mL/hr over 60 Minutes Intravenous  Once 07/10/21 1702 07/10/21 2001   07/10/21 1715  ceFEPIme (MAXIPIME) 2 g in sodium chloride 0.9 % 100 mL IVPB        2 g 200 mL/hr over 30 Minutes Intravenous  Once 07/10/21 1709 07/10/21 1914        I have personally reviewed the following labs and images: CBC: Recent Labs  Lab 07/10/21 1930  07/11/21 0259 07/11/21 0527  WBC 16.9* 16.4* 15.1*  NEUTROABS 15.2*  --  12.1*  HGB 11.2* 12.0* 12.1*  HCT 34.2* 37.0* 37.8*  MCV 95.3 95.9 96.9  PLT 191 193 211   BMP &GFR Recent Labs  Lab 07/10/21 1930 07/11/21 0259 07/11/21 0527  NA 137  --  140  K 3.6  --  3.5  CL 104  --  106  CO2 23  --  26  GLUCOSE 122*  --  116*  BUN 23  --  20  CREATININE 1.56* 1.34* 1.38*  CALCIUM 8.1*  --  8.2*   Estimated Creatinine Clearance: 55.4 mL/min (A) (by C-G formula based on SCr of 1.38 mg/dL (H)). Liver & Pancreas: Recent Labs  Lab 07/10/21 1930 07/11/21 0527  AST 16 13*  ALT 17 16  ALKPHOS 72 63  BILITOT 0.9 0.9  PROT 6.1* 5.7*  ALBUMIN 3.2* 2.9*   No results for input(s): LIPASE, AMYLASE in the last 168 hours. No results for input(s): AMMONIA in the last 168 hours. Diabetic: No results for input(s): HGBA1C in the last 72 hours. No results for input(s): GLUCAP in the last 168 hours. Cardiac Enzymes: No results for input(s): CKTOTAL, CKMB, CKMBINDEX, TROPONINI in the last 168 hours. No results for input(s): PROBNP in the last 8760 hours. Coagulation Profile: Recent Labs  Lab 07/10/21 1930 07/11/21 0527  INR  1.2 1.2   Thyroid Function Tests: No results for input(s): TSH, T4TOTAL, FREET4, T3FREE, THYROIDAB in the last 72 hours. Lipid Profile: No results for input(s): CHOL, HDL, LDLCALC, TRIG, CHOLHDL, LDLDIRECT in the last 72 hours. Anemia Panel: No results for input(s): VITAMINB12, FOLATE, FERRITIN, TIBC, IRON, RETICCTPCT in the last 72 hours. Urine analysis:    Component Value Date/Time   COLORURINE YELLOW 07/10/2021 2330   APPEARANCEUR HAZY (A) 07/10/2021 2330   LABSPEC 1.019 07/10/2021 2330   PHURINE 6.0 07/10/2021 2330   GLUCOSEU NEGATIVE 07/10/2021 2330   HGBUR NEGATIVE 07/10/2021 2330   BILIRUBINUR NEGATIVE 07/10/2021 2330   KETONESUR NEGATIVE 07/10/2021 2330   PROTEINUR NEGATIVE 07/10/2021 2330   NITRITE NEGATIVE 07/10/2021 2330   LEUKOCYTESUR LARGE (A) 07/10/2021 2330   Sepsis Labs: Invalid input(s): PROCALCITONIN, [place]  Microbiology: Recent Results (from the past 240 hour(s))  Resp Panel by RT-PCR (Flu A&B, Covid) Nasopharyngeal Swab     Status: None   Collection Time: 07/10/21  6:40 PM   Specimen: Nasopharyngeal Swab; Nasopharyngeal(NP) swabs in vial transport medium  Result Value Ref Range Status   SARS Coronavirus 2 by RT PCR NEGATIVE NEGATIVE Final    Comment: (NOTE) SARS-CoV-2 target nucleic acids are NOT DETECTED.  The SARS-CoV-2 RNA is generally detectable in upper respiratory specimens during the acute phase of infection. The lowest concentration of SARS-CoV-2 viral copies this assay can detect is 138 copies/mL. A negative result does not preclude SARS-Cov-2 infection and should not be used as the sole basis for treatment or other patient management decisions. A negative result may occur with  improper specimen collection/handling, submission of specimen other than nasopharyngeal swab, presence of viral mutation(s) within the areas targeted by this assay, and inadequate number of viral copies(<138 copies/mL). A negative result must be combined  with clinical observations, patient history, and epidemiological information. The expected result is Negative.  Fact Sheet for Patients:  EntrepreneurPulse.com.au  Fact Sheet for Healthcare Providers:  IncredibleEmployment.be  This test is no t yet approved or cleared by the [place] FDA and  has been authorized for detection and/or diagnosis  of SARS-CoV-2 by FDA under an Emergency Use Authorization (EUA). This EUA will remain  in effect (meaning this test can be used) for the duration of the COVID-19 declaration under Section 564(b)(1) of the Act, 21 U.S.C.section 360bbb-3(b)(1), unless the authorization is terminated  or revoked sooner.       Influenza A by PCR NEGATIVE NEGATIVE Final   Influenza B by PCR NEGATIVE NEGATIVE Final    Comment: (NOTE) The Xpert Xpress SARS-CoV-2/FLU/RSV plus assay is intended as an aid in the diagnosis of influenza from Nasopharyngeal swab specimens and should not be used as a sole basis for treatment. Nasal washings and aspirates are unacceptable for Xpert Xpress SARS-CoV-2/FLU/RSV testing.  Fact Sheet for Patients: EntrepreneurPulse.com.au  Fact Sheet for Healthcare Providers: IncredibleEmployment.be  This test is not yet approved or cleared by the [place] FDA and has been authorized for detection and/or diagnosis of SARS-CoV-2 by FDA under an Emergency Use Authorization (EUA). This EUA will remain in effect (meaning this test can be used) for the duration of the COVID-19 declaration under Section 564(b)(1) of the Act, 21 U.S.C. section 360bbb-3(b)(1), unless the authorization is terminated or revoked.  Performed at [facility], [place] [address], [place], [place] [address]   Blood Culture (routine x 2)     Status: None (Preliminary result)   Collection Time: 07/10/21  6:40 PM   Specimen: BLOOD  Result Value Ref Range Status   Specimen  Description   Final    BLOOD LEFT HAND Performed at [facility] [address] [place], [place] [address]    Special Requests   Final    BOTTLES DRAWN AEROBIC AND ANAEROBIC Blood Culture results may not be optimal due to an inadequate volume of blood received in culture bottles Performed at [facility] [address], [place], [place] [address]    Culture   Final    NO GROWTH < 12 HOURS Performed at [facility] [address], [place], [place] [address]    Report Status PENDING  Incomplete  Blood Culture (routine x 2)     Status: None (Preliminary result)   Collection Time: 07/10/21  6:40 PM   Specimen: BLOOD  Result Value Ref Range Status   Specimen Description   Final    BLOOD LEFT ANTECUBITAL Performed at [facility] [address], [place], [place] [address]    Special Requests   Final    BOTTLES DRAWN AEROBIC AND ANAEROBIC Blood Culture results may not be optimal due to an excessive volume of blood received in culture bottles Performed at [facility] [address], [place], [place] [address]    Culture   Final    NO GROWTH < 12 HOURS Performed at [facility] [address] [place], [place] [address]    Report Status PENDING  Incomplete  MRSA Next Gen by PCR, Nasal     Status: None   Collection Time: 07/11/21 12:49 AM   Specimen: Nasal Mucosa; Nasal Swab  Result Value Ref Range Status   MRSA by PCR Next Gen NOT DETECTED NOT DETECTED Final    Comment: (NOTE) The GeneXpert MRSA Assay (FDA approved for NASAL specimens only), is one component of a comprehensive MRSA colonization surveillance program. It is not intended to diagnose MRSA infection nor to guide or monitor treatment for MRSA infections. Test performance is not FDA approved in patients less than 36 years old. Performed at [facility], [place] [address], [place], [place] [address]      Radiology Studies: US Abdomen Complete  Result Date: 07/10/2021 CLINICAL DATA:  Sepsis EXAM: ABDOMEN ULTRASOUND COMPLETE COMPARISON:  None. FINDINGS: Gallbladder: No gallstones or wall thickening visualized. No sonographic Murphy sign noted by sonographer. Common bile duct: Diameter: Normal caliber, 6 mm. Liver: No focal lesion identified. Within normal limits in parenchymal echogenicity. Portal vein is patent on color Doppler imaging with normal direction of blood flow towards the liver. IVC: No abnormality visualized. Pancreas: Visualized portion unremarkable. Spleen: Size and appearance within normal limits. Right Kidney: Length: Not visualized. Left Kidney: Length: 15.6 cm. Normal echotexture. Small cysts, the largest measuring 3.2 cm. Abdominal aorta: No aneurysm. Other findings: None. IMPRESSION: No acute findings. Electronically Signed   By: Rolm Baptise M.D.   On: 07/10/2021 18:48   CT Abdomen Pelvis W Contrast  Result Date: 07/10/2021 CLINICAL DATA:  Abdominal abscess/infection suspected Patient reports fever, lethargy and body aches. History of prostate cancer post radiation for bony lesions. EXAM: CT ABDOMEN AND PELVIS WITH CONTRAST TECHNIQUE: Multidetector CT imaging of the abdomen and pelvis was performed using the standard protocol following bolus administration of intravenous contrast. CONTRAST:  40m OMNIPAQUE IOHEXOL 350 MG/ML SOLN COMPARISON:  Abdominal ultrasound earlier today. PET CT 05/23/2021, abdominal CT 04/04/2021. Abdominal MRI 04/19/2021 FINDINGS: Lower chest: Descending thoracic aorta is tortuous. There is dependent atelectasis in the medial aspect of both lower lobes. Small to moderate-sized hiatal hernia. There is minimal basilar septal thickening. Trace pleural effusions. Hepatobiliary: No focal liver abnormality is seen. No gallstones, gallbladder wall thickening, or biliary dilatation. Pancreas: No ductal dilatation or inflammation. No evidence of pancreatic mass. Spleen:  There is scattered low-density lesions throughout the spleen, largest centrally measures 11 mm, other lesions are subcentimeter. Overall normal in size. Adrenals/Urinary Tract: No adrenal nodule. Chronic right renal parenchymal atrophy. There are innumerable lesions of the right kidney likely representing simple and complex renal cysts. Slight dilatation of the right renal pelvis with ureteral thickening. Nonobstructing calculi in the lower right kidney measuring up to 14 mm. There is slight compensatory hypertrophy of the left kidney. Multiple simple and complex cysts throughout the left kidney. Slight dilatation of the left renal pelvis with mild left ureteral dilatation. No obstructing stone. There is a nonobstructing 5 mm stone in the lower left kidney. Partially distended urinary bladder. No bladder wall thickening Stomach/Bowel: Bowel assessment is limited in the absence of enteric contrast. Small to moderate hiatal hernia. Stomach is nondistended further limiting assessment. There is no small bowel obstruction or inflammation. Appendix is tentatively but not definitively identified and normal. Small volume of colonic stool. Colonic diverticulosis involving the descending and sigmoid colon. No diverticulitis. No colonic inflammation. Vascular/Lymphatic: Aortic atherosclerosis and tortuosity. No aortic aneurysm. Patent portal vein. No enlarged lymph nodes in the abdomen or pelvis. Reproductive: Prostatectomy. Other: Fat in the left greater than right inguinal canals. No ascites or free air. No abscess or abdominopelvic collection. Musculoskeletal: Stable appearance of sclerotic lesion in the right ischium. Tiny sclerotic lesion in the right acetabulum is unchanged. No acute fracture or new osseous lesion. Diffuse degenerative change in the spine IMPRESSION: 1. No acute abnormality or explanation for abdominal pain. 2. Colonic diverticulosis without diverticulitis. 3. Chronic right renal parenchymal atrophy.  Bilateral nonobstructing renal calculi. Bilateral simple and complex renal cysts, recently assessed on MRI. 4. Multiple low-density lesions throughout the spleen, largest centrally measures 11 mm. These are typically benign, and not seen on prior noncontrast  exams. 5. Stable sclerotic lesions in the right ischium and right acetabulum. 6. Small to moderate-sized hiatal hernia. 7. Minimal basilar septal thickening at the lung bases, may represent mild pulmonary edema. Trace pleural effusions. Aortic Atherosclerosis (ICD10-I70.0). Electronically Signed   By: Keith Rake M.D.   On: 07/10/2021 21:53   DG Chest Port 1 View  Result Date: 07/10/2021 CLINICAL DATA:  Questionable sepsis EXAM: PORTABLE CHEST 1 VIEW COMPARISON:  11/23/2006 FINDINGS: Heart is borderline in size. Tortuosity of the thoracic aorta. No confluent airspace opacities or effusions. No acute bony abnormality. IMPRESSION: Borderline heart size.  Tortuous aorta.  No active disease. Electronically Signed   By: Rolm Baptise M.D.   On: 07/10/2021 17:45      Hevin Jeffcoat T. Moca  If 7PM-7AM, please contact night-coverage [email or web address] 07/11/2021, 12:19 PM

## 2021-07-11 NOTE — Discharge Instructions (Addendum)

## 2021-07-12 DIAGNOSIS — E876 Hypokalemia: Secondary | ICD-10-CM

## 2021-07-12 DIAGNOSIS — I1310 Hypertensive heart and chronic kidney disease without heart failure, with stage 1 through stage 4 chronic kidney disease, or unspecified chronic kidney disease: Secondary | ICD-10-CM

## 2021-07-12 DIAGNOSIS — N390 Urinary tract infection, site not specified: Secondary | ICD-10-CM

## 2021-07-12 LAB — RENAL FUNCTION PANEL
Albumin: 2.9 g/dL — ABNORMAL LOW (ref 3.5–5.0)
Anion gap: 5 (ref 5–15)
BUN: 18 mg/dL (ref 8–23)
CO2: 26 mmol/L (ref 22–32)
Calcium: 8.2 mg/dL — ABNORMAL LOW (ref 8.9–10.3)
Chloride: 112 mmol/L — ABNORMAL HIGH (ref 98–111)
Creatinine, Ser: 1.26 mg/dL — ABNORMAL HIGH (ref 0.61–1.24)
GFR, Estimated: 58 mL/min — ABNORMAL LOW (ref >60)
Glucose, Bld: 102 mg/dL — ABNORMAL HIGH (ref 70–99)
Phosphorus: 3.3 mg/dL (ref 2.5–4.6)
Potassium: 3.7 mmol/L (ref 3.5–5.1)
Sodium: 143 mmol/L (ref 135–145)

## 2021-07-12 LAB — CBC
HCT: 38.8 % — ABNORMAL LOW (ref 39.0–52.0)
Hemoglobin: 12.4 g/dL — ABNORMAL LOW (ref 13.0–17.0)
MCH: 30.7 pg (ref 26.0–34.0)
MCHC: 32 g/dL (ref 30.0–36.0)
MCV: 96 fL (ref 80.0–100.0)
Platelets: 237 10*3/uL (ref 150–400)
RBC: 4.04 MIL/uL — ABNORMAL LOW (ref 4.22–5.81)
RDW: 14.6 % (ref 11.5–15.5)
WBC: 9.4 10*3/uL (ref 4.0–10.5)
nRBC: 0 % (ref 0.0–0.2)

## 2021-07-12 LAB — MAGNESIUM: Magnesium: 2 mg/dL (ref 1.7–2.4)

## 2021-07-12 LAB — BRAIN NATRIURETIC PEPTIDE: B Natriuretic Peptide: 208.4 pg/mL — ABNORMAL HIGH (ref 0.0–100.0)

## 2021-07-12 LAB — TSH: TSH: 2.36 u[IU]/mL (ref 0.350–4.500)

## 2021-07-12 MED ORDER — TORSEMIDE 20 MG PO TABS
20.0000 mg | ORAL_TABLET | Freq: Every day | ORAL | Status: DC
  Filled 2021-07-12: qty 1

## 2021-07-12 MED ORDER — CEFUROXIME AXETIL 500 MG PO TABS: 500.0000 mg | ORAL_TABLET | Freq: Two times a day (BID) | ORAL | 0 refills | Status: DC

## 2021-07-12 MED ORDER — APIXABAN 5 MG PO TABS: 5.0000 mg | ORAL_TABLET | Freq: Two times a day (BID) | ORAL | 1 refills | Status: DC

## 2021-07-12 NOTE — Progress Notes (Signed)
Subjective:  Feeling well. Wants to go home.  Objective:  Vital Signs in the last 24 hours: Temp:  [97.5 F (36.4 C)-98 F (36.7 C)] 97.6 F (36.4 C) (08/10 0558) Pulse Rate:  [57-83] 70 (08/10 0558) Resp:  [12-20] 18 (08/10 0558) BP: (113-148)/(69-94) 136/83 (08/10 0558) SpO2:  [94 %-98 %] 96 % (08/10 0558)  Intake/Output from previous day: 08/09 0701 - 08/10 0700 In: 1202.1 [P.O.:700; I.V.:361.2; IV Piggyback:140.9] Out: 3250 [Urine:3250]  Physical Exam Vitals and nursing note reviewed.  Constitutional:      General: He is not in acute distress.    Appearance: He is well-developed.  HENT:     Head: Normocephalic and atraumatic.  Eyes:     Conjunctiva/sclera: Conjunctivae normal.     Pupils: Pupils are equal, round, and reactive to light.  Neck:     Vascular: No JVD.  Cardiovascular:     Rate and Rhythm: Normal rate and regular rhythm.     Pulses: Normal pulses and intact distal pulses.     Heart sounds: No murmur heard. Pulmonary:     Effort: Pulmonary effort is normal.     Breath sounds: Normal breath sounds. No wheezing or rales.  Abdominal:     General: Bowel sounds are normal.     Palpations: Abdomen is soft.     Tenderness: There is no rebound.  Musculoskeletal:        General: No tenderness. Normal range of motion.     Right lower leg: Edema (1+) present.     Left lower leg: Edema (1+) present.  Lymphadenopathy:     Cervical: No cervical adenopathy.  Skin:    General: Skin is warm and dry.  Neurological:     Mental Status: He is alert and oriented to person, place, and time.     Cranial Nerves: No cranial nerve deficit.     Lab Results: BMP Recent Labs    07/10/21 1930 07/11/21 0259 07/11/21 0527 07/12/21 0404  NA 137  --  140 143  K 3.6  --  3.5 3.7  CL 104  --  106 112*  CO2 23  --  26 26  GLUCOSE 122*  --  116* 102*  BUN 23  --  20 18  CREATININE 1.56* 1.34* 1.38* 1.26*  CALCIUM 8.1*  --  8.2* 8.2*  GFRNONAA 45* 54* 52* 58*     CBC Recent Labs  Lab 07/11/21 0527 07/12/21 0404  WBC 15.1* 9.4  RBC 3.90* 4.04*  HGB 12.1* 12.4*  HCT 37.8* 38.8*  PLT 211 237  MCV 96.9 96.0  MCH 31.0 30.7  MCHC 32.0 32.0  RDW 14.6 14.6  LYMPHSABS 1.7  --   MONOABS 1.0  --   EOSABS 0.2  --   BASOSABS 0.0  --     HEMOGLOBIN A1C No results found for: HGBA1C, MPG  Cardiac Panel (last 3 results) No results for input(s): CKTOTAL, CKMB, TROPONINI, RELINDX in the last 8760 hours.  BNP (last 3 results) Recent Labs    07/12/21 0404  BNP 208.4*    TSH Recent Labs    07/12/21 0404  TSH 2.360    Lipid Panel  No results found for: CHOL, TRIG, HDL, CHOLHDL, VLDL, LDLCALC, LDLDIRECT   Hepatic Function Panel Recent Labs    07/10/21 1930 07/11/21 0527 07/12/21 0404  PROT 6.1* 5.7*  --   ALBUMIN 3.2* 2.9* 2.9*  AST 16 13*  --   ALT 17 16  --   ALKPHOS  72 63  --   BILITOT 0.9 0.9  --     Imaging: CT abdomen/pelvis 07/10/2021: 1. No acute abnormality or explanation for abdominal pain. 2. Colonic diverticulosis without diverticulitis. 3. Chronic right renal parenchymal atrophy. Bilateral nonobstructing renal calculi. Bilateral simple and complex renal cysts, recently assessed on MRI. 4. Multiple low-density lesions throughout the spleen, largest centrally measures 11 mm. These are typically benign, and not seen on prior noncontrast exams. 5. Stable sclerotic lesions in the right ischium and right acetabulum. 6. Small to moderate-sized hiatal hernia. 7. Minimal basilar septal thickening at the lung bases, may represent mild pulmonary edema. Trace pleural effusions.   Aortic Atherosclerosis (ICD10-I70.0).    Cardiac Studies:  Telemetry: Aflutter with controlled rate   EKG 07/11/2021: Atrial flutter with 4:1 AV block  Echocardiogram 07/11/2021:  1. Left ventricular ejection fraction, by estimation, is 55 to 60%. The  left ventricle has normal function. The left ventricle has no regional  wall motion  abnormalities. There is mild left ventricular hypertrophy.  Left ventricular diastolic parameters are indeterminate.   2. Right ventricular systolic function is normal. The right ventricular  size is normal. There is normal pulmonary artery systolic pressure.   3. The mitral valve is normal in structure. No evidence of mitral valve  regurgitation. No evidence of mitral stenosis.   4. The aortic valve is normal in structure. Aortic valve regurgitation is  not visualized. No aortic stenosis is present.   5. Aortic dilatation noted. There is mild dilatation of the ascending  aorta, measuring 42 mm.   6. The inferior vena cava is dilated in size with <50% respiratory  variability, suggesting right atrial pressure of 15 mmHg.   Assessment & Recommendations:  80 y.o. Caucasian male  with hypertension, h/o prostate cancer s/p prostatectomy and radiation, h/o Barrett's esophagus, chronic leg edema, now admitted with possible SIRS/allergic reaction.  Cardiology consulted for management of atrial flutter.   Atrial flutter: Variable AV conduction, ventricular rate in 70s.  Patient is on metoprolol succinate 100 mg daily at home, currently getting metoprolol tartrate 25 mg twice daily. Recommend resuming home metoprolol succinate at 50 mg daily on discharge.  CHA2DS2VASc score at least 3. Recommend anticoagulation with eliquis 5 mg bid. Stop Aspirin.  HFpEF: Normal EF, indeterminate diastolic function, elevated BNP. He was on oral lasix 80 mg bid without much diuresis. He diuresed 3.2 L with IV lasix 40 mg bid in the hospital. Since he wants to ho home today, recommend switching to torsemide 40 mg PO bid, continue the same on discharge in place of lasix 80 mg PO bid. Encourage ambulation. Can consider outpatient stress test and sleep study.   Cardiology will sign off Outpatient f/u w/me on 8/16 10:30 AM     Nigel Mormon, MD Pager: [phone] Office: [phone]

## 2021-07-12 NOTE — Discharge Summary (Signed)
DISCHARGE SUMMARY  Matthew [name].  MR#: [id number]  DOB:[birth year]  Date of Admission: 07/10/2021 Date of Discharge: 07/12/2021  Attending Physician:Cayne Yom Hennie Duos, MD  Patient's GA:[id number], Denton Ar, MD  Consults: Cardiology   Disposition: D/C home   Follow-up Appts:  Follow-up Information     Nigel Mormon, MD Follow up on 07/18/2021.   Specialties: Cardiology, Radiology Why: 10:30 AM Contact information: [place] [address] [phone]         Wenda Low, MD Follow up in 1 week(s).   Specialty: Internal Medicine Contact information: [address], [address] [place] [address] [phone]                 Tests Needing Follow-up: -assess HR and rhythm  -assess for sx of UTI  -assess for tolerance of Eliquis   Discharge Diagnoses: Sepsis due to E coli UTI  Newly diagnosed atrial flutter/atrial fibrillation CKD stage III AAA HTN Chronic bilateral lower extremity edema Hypokalemia History of prostate cancer  Initial presentation: 80yo with a history of prostate cancer status post radiation, HTN, chronic lower extremity edema, and CKD who presented to the ER with intermittent fevers, nausea, vomiting, and chills for approximately 7-10 days.  In the ER he was found to have a temperature of 102.8 and a WBC of 17.  He was not tachycardic.  Initial lactic acid was high at 6.2.  He was COVID-negative.  CT abdomen/pelvis was without acute findings.  CXR revealed no acute infiltrate.  Abdominal ultrasound was unrevealing.  Hospital Course:                      Sepsis due to E coli UTI  CXR unrevealing - CT abdom/pelvis and abdominal ultrasound unrevealing - blood cultures negative - UA abnormal and urine culture confirmed > 100k E coli - procalcitonin elevated - empiric broad-spectrum antibiotics dosed via IV during admit, and transitioned to oral route at time of d/c to complete a full 7 days of tx -  pt was much improved at admit and able to tolerate oral intake w/o difficutly    Newly diagnosed atrial flutter/atrial fibrillation Cardiology evaluated during this admit and suggested metoprolol '50mg'$  daily, as well as eliquis '5mg'$  BID (stopping ASA) - to f/u w/ Dr. Virgina Jock as outpt  - resumed prior BB dose at d/c as pt was hesitant to adjust meds, and HR and BP appeared to be able to tolerate the higher dose   CKD stage III AAA Creatinine improving/stable at time of d/c   Recent Labs  Lab 07/10/21 1930 07/11/21 0259 07/11/21 0527 07/12/21 0404  CREATININE 1.56* 1.34* 1.38* 1.26*    HTN Blood pressure not ideal at time of d/c - to resume higher dose of BB upon returning home    Chronic bilateral lower extremity edema Resume usual dose of lasix at home, as pt favors this to switching to torsemide  Hypokalemia Corrected with supplementation -due to poor oral intake as well as diuretic  History of prostate cancer  Allergies as of 07/12/2021   No Known Allergies      Medication List     STOP taking these medications    amLODipine 10 MG tablet Commonly known as: NORVASC   aspirin EC 325 MG tablet       TAKE these medications    allopurinol 300 MG tablet Commonly known as: ZYLOPRIM Take 300 mg by mouth daily.   apixaban 5 MG Tabs  tablet Commonly known as: ELIQUIS Take 1 tablet (5 mg total) by mouth 2 (two) times daily.   atorvastatin 20 MG tablet Commonly known as: LIPITOR Take 20 mg by mouth daily.   cefUROXime 500 MG tablet Commonly known as: CEFTIN Take 1 tablet (500 mg total) by mouth 2 (two) times daily with a meal.   esomeprazole 20 MG capsule Commonly known as: NEXIUM Take 20 mg by mouth daily.   furosemide 80 MG tablet Commonly known as: LASIX Take 80 mg by mouth 2 (two) times daily.   metoprolol succinate 100 MG 24 hr tablet Commonly known as: TOPROL-XL Take 100 mg by mouth daily. Take with or immediately following a meal.   multivitamin  with minerals Tabs tablet Take 1 tablet by mouth daily.        Day of Discharge BP 132/87   Pulse 66   Temp 97.8 F (36.6 C) (Oral)   Resp 20   Ht 6' (1.829 m)   Wt 109.3 kg   SpO2 98%   BMI 32.68 kg/m   Physical Exam: General: No acute respiratory distress Lungs: Clear to auscultation bilaterally without wheezes or crackles Cardiovascular: Regular rate and rhythm without murmur gallop or rub normal S1 and S2 Abdomen: Nontender, nondistended, soft, bowel sounds positive, no rebound, no ascites, no appreciable mass Extremities: No significant cyanosis, clubbing, or edema bilateral lower extremities  Basic Metabolic Panel: Recent Labs  Lab 07/10/21 1930 07/11/21 0259 07/11/21 0527 07/12/21 0404  NA 137  --  140 143  K 3.6  --  3.5 3.7  CL 104  --  106 112*  CO2 23  --  26 26  GLUCOSE 122*  --  116* 102*  BUN 23  --  20 18  CREATININE 1.56* 1.34* 1.38* 1.26*  CALCIUM 8.1*  --  8.2* 8.2*  MG  --   --  2.0 2.0  PHOS  --   --   --  3.3    Liver Function Tests: Recent Labs  Lab 07/10/21 1930 07/11/21 0527 07/12/21 0404  AST 16 13*  --   ALT 17 16  --   ALKPHOS 72 63  --   BILITOT 0.9 0.9  --   PROT 6.1* 5.7*  --   ALBUMIN 3.2* 2.9* 2.9*    Coags: Recent Labs  Lab 07/10/21 1930 07/11/21 0527  INR 1.2 1.2     CBC: Recent Labs  Lab 07/10/21 1930 07/11/21 0259 07/11/21 0527 07/12/21 0404  WBC 16.9* 16.4* 15.1* 9.4  NEUTROABS 15.2*  --  12.1*  --   HGB 11.2* 12.0* 12.1* 12.4*  HCT 34.2* 37.0* 37.8* 38.8*  MCV 95.3 95.9 96.9 96.0  PLT 191 193 211 237    BNP (last 3 results) Recent Labs    07/12/21 0404  BNP 208.4*     Recent Results (from the past 240 hour(s))  Resp Panel by RT-PCR (Flu A&B, Covid) Nasopharyngeal Swab     Status: None   Collection Time: 07/10/21  6:40 PM   Specimen: Nasopharyngeal Swab; Nasopharyngeal(NP) swabs in vial transport medium  Result Value Ref Range Status   SARS Coronavirus 2 by RT PCR NEGATIVE NEGATIVE  Final    Comment: (NOTE) SARS-CoV-2 target nucleic acids are NOT DETECTED.  The SARS-CoV-2 RNA is generally detectable in upper respiratory specimens during the acute phase of infection. The lowest concentration of SARS-CoV-2 viral copies this assay can detect is 138 copies/mL. A negative result does not preclude SARS-Cov-2 infection and should  not be used as the sole basis for treatment or other patient management decisions. A negative result may occur with  improper specimen collection/handling, submission of specimen other than nasopharyngeal swab, presence of viral mutation(s) within the areas targeted by this assay, and inadequate number of viral copies(<138 copies/mL). A negative result must be combined with clinical observations, patient history, and epidemiological information. The expected result is Negative.  Fact Sheet for Patients:  EntrepreneurPulse.com.au  Fact Sheet for Healthcare Providers:  IncredibleEmployment.be  This test is no t yet approved or cleared by the [place] FDA and  has been authorized for detection and/or diagnosis of SARS-CoV-2 by FDA under an Emergency Use Authorization (EUA). This EUA will remain  in effect (meaning this test can be used) for the duration of the COVID-19 declaration under Section 564(b)(1) of the Act, 21 U.S.C.section 360bbb-3(b)(1), unless the authorization is terminated  or revoked sooner.       Influenza A by PCR NEGATIVE NEGATIVE Final   Influenza B by PCR NEGATIVE NEGATIVE Final    Comment: (NOTE) The Xpert Xpress SARS-CoV-2/FLU/RSV plus assay is intended as an aid in the diagnosis of influenza from Nasopharyngeal swab specimens and should not be used as a sole basis for treatment. Nasal washings and aspirates are unacceptable for Xpert Xpress SARS-CoV-2/FLU/RSV testing.  Fact Sheet for Patients: EntrepreneurPulse.com.au  Fact Sheet for Healthcare  Providers: IncredibleEmployment.be  This test is not yet approved or cleared by the [place] FDA and has been authorized for detection and/or diagnosis of SARS-CoV-2 by FDA under an Emergency Use Authorization (EUA). This EUA will remain in effect (meaning this test can be used) for the duration of the COVID-19 declaration under Section 564(b)(1) of the Act, 21 U.S.C. section 360bbb-3(b)(1), unless the authorization is terminated or revoked.  Performed at [facility], [place] [address] [place], [place] [address]   Blood Culture (routine x 2)     Status: None (Preliminary result)   Collection Time: 07/10/21  6:40 PM   Specimen: BLOOD  Result Value Ref Range Status   Specimen Description   Final    BLOOD LEFT HAND Performed at [facility] [address], [place], [place] [address]    Special Requests   Final    BOTTLES DRAWN AEROBIC AND ANAEROBIC Blood Culture results may not be optimal due to an inadequate volume of blood received in culture bottles Performed at [facility] [address] [place], [place] [address]    Culture   Final    NO GROWTH 2 DAYS Performed at [facility] [address] [place], [place] [address]    Report Status PENDING  Incomplete  Blood Culture (routine x 2)     Status: None (Preliminary result)   Collection Time: 07/10/21  6:40 PM   Specimen: BLOOD  Result Value Ref Range Status   Specimen Description   Final    BLOOD LEFT ANTECUBITAL Performed at [facility] [address] [place], [place] [address]    Special Requests   Final    BOTTLES DRAWN AEROBIC AND ANAEROBIC Blood Culture results may not be optimal due to an excessive volume of blood received in culture bottles Performed at [facility] [address] [place], [place] [address]    Culture   Final    NO GROWTH 2 DAYS Performed at [facility] [address] [place], [place] [address]    Report Status PENDING  Incomplete  Urine Culture     Status: Abnormal (Preliminary result)   Collection Time: 07/10/21 11:30 PM   Specimen: Urine, Clean Catch  Result Value Ref Range Status   Specimen Description   Final    URINE, CLEAN CATCH Performed at [facility], [place] [address], [place], [place] [address]    Special Requests   Final    NONE Performed at [facility], [place] [address] [place] [address], [place], [place] [address]    Culture (A)  Final    10,000 COLONIES/mL ESCHERICHIA COLI SUSCEPTIBILITIES TO FOLLOW Performed at [facility], [place] [address] [place], [place] [address]    Report Status PENDING  Incomplete  MRSA Next Gen by PCR, Nasal     Status: None   Collection Time: 07/11/21 12:49 AM   Specimen: Nasal Mucosa; Nasal Swab  Result Value Ref Range Status   MRSA by PCR Next Gen NOT DETECTED NOT DETECTED Final    Comment: (NOTE) The GeneXpert MRSA Assay (FDA approved for NASAL specimens only), is one component of a comprehensive MRSA colonization surveillance program. It is not intended to diagnose MRSA infection nor to guide or monitor treatment for MRSA infections. Test performance is not FDA approved in patients less than 29 years old. Performed at [facility], [place] [address], [place], [place] [address]       Time spent in discharge (includes decision making & examination of pt): 35 minutes  07/12/2021, 2:31 PM   Cherene Altes, MD [facility] Office  [phone]

## 2021-07-12 NOTE — Progress Notes (Signed)
Assumed care of pt from Matthew [name], [place]. Agree with her assessment of the pt. Pt awake, alert, no c/o pain, no s/s of respiratory distress and will continue plan of care.

## 2021-07-12 NOTE — Progress Notes (Addendum)
The patient has been discharge to home. Instructions were reviewed. Questions, concerns were denied at this time. The pt remains stable, alert, oriented(x4) ambulatory without assistance.

## 2021-07-12 NOTE — Progress Notes (Signed)
Cardiologist discussed use of torsemide with the patient. He is not agreeable to taking medication at this time. Education provided regarding risks and benefits of medication compliance.Will continue to monitor.

## 2021-07-13 LAB — URINE CULTURE: Culture: 10000 — AB

## 2021-07-15 LAB — CULTURE, BLOOD (ROUTINE X 2)
Culture: NO GROWTH
Culture: NO GROWTH

## 2021-07-18 ENCOUNTER — Other Ambulatory Visit: Payer: Self-pay | Admitting: Cardiology

## 2021-07-18 ENCOUNTER — Ambulatory Visit: Payer: Medicare PPO | Admitting: Cardiology

## 2021-07-18 ENCOUNTER — Other Ambulatory Visit: Payer: Self-pay

## 2021-07-18 ENCOUNTER — Encounter: Payer: Self-pay | Admitting: Cardiology

## 2021-07-18 VITALS — BP 128/74 | HR 67 | Temp 98.3°F | Resp 16 | Ht 72.0 in | Wt 233.0 lb

## 2021-07-18 DIAGNOSIS — I5032 Chronic diastolic (congestive) heart failure: Secondary | ICD-10-CM | POA: Insufficient documentation

## 2021-07-18 DIAGNOSIS — I483 Typical atrial flutter: Secondary | ICD-10-CM | POA: Insufficient documentation

## 2021-07-18 DIAGNOSIS — I5033 Acute on chronic diastolic (congestive) heart failure: Secondary | ICD-10-CM | POA: Diagnosis not present

## 2021-07-18 DIAGNOSIS — I10 Essential (primary) hypertension: Secondary | ICD-10-CM

## 2021-07-18 MED ORDER — TORSEMIDE 40 MG PO TABS: 20.0000 mg | ORAL_TABLET | Freq: Two times a day (BID) | ORAL | 3 refills | Status: DC

## 2021-07-18 NOTE — Progress Notes (Signed)
Follow up visit  Subjective:   Matthew Lauber., male    DOB: Apr 09, 1941, 80 y.o.   MRN: [id number]    HPI  Chief Complaint  Patient presents with   SIRS   Hypertension   New Patient (Initial Visit)    80 y.o. Caucasian male  with hypertension, h/o prostate cancer s/p prostatectomy and radiation, h/o Barrett's esophagus, new diagnosis of atrial flutter and HFpEF during hospital admission with SIRS/allergic reaction (07/2021)  Patient is here for follow up with his wife. He is tolerating eliquis well without any bleeding issues. Rate is controlled on metoprolol. He had improvement in leg edema with IV lasix, but has had return of edema while on PO lasix 40 mg bid. On further questioning, patient's wife reports episodes where he "stops breathing" at night.   Current Outpatient Medications on File Prior to Visit  Medication Sig Dispense Refill   allopurinol (ZYLOPRIM) 300 MG tablet Take 300 mg by mouth daily.      apixaban (ELIQUIS) 5 MG TABS tablet Take 1 tablet (5 mg total) by mouth 2 (two) times daily. 60 tablet 1   atorvastatin (LIPITOR) 20 MG tablet Take 20 mg by mouth daily.     esomeprazole (NEXIUM) 20 MG capsule Take 20 mg by mouth daily.      furosemide (LASIX) 80 MG tablet Take 80 mg by mouth 2 (two) times daily.     metoprolol succinate (TOPROL-XL) 100 MG 24 hr tablet Take 100 mg by mouth daily. Take with or immediately following a meal.     Multiple Vitamin (MULTIVITAMIN WITH MINERALS) TABS tablet Take 1 tablet by mouth daily.     No current facility-administered medications on file prior to visit.    Cardiovascular & other pertient studies:  EKG 07/18/2021: Atrial flutter with 4:1 AV block Ventricular rate 68 bpm    Recent labs: 07/12/2021: Glucose 102, BUN/Cr 18/1.26. EGFR 58. Na/K 143/3.7.  BNP 208 H/H 12/38. MCV 96. Platelets 237 TSH 2.3 normal HbA1C N/A Lipid panel N/A    Review of Systems  Cardiovascular:  Positive for leg swelling. Negative for chest  pain, dyspnea on exertion, palpitations and syncope.  Respiratory:  Positive for snoring.         Vitals:   07/18/21 1031  BP: 128/74  Pulse: 67  Resp: 16  Temp: 98.3 F (36.8 C)  SpO2: 97%    Body mass index is 31.6 kg/m. Filed Weights   07/18/21 1031  Weight: 233 lb (105.7 kg)     Objective:   Physical Exam Vitals and nursing note reviewed.  Constitutional:      General: He is not in acute distress. Neck:     Vascular: No JVD.  Cardiovascular:     Rate and Rhythm: Normal rate and regular rhythm.     Heart sounds: Normal heart sounds. No murmur heard. Pulmonary:     Effort: Pulmonary effort is normal.     Breath sounds: Normal breath sounds. No wheezing or rales.  Musculoskeletal:     Right lower leg: Edema (2+) present.     Left lower leg: Edema (2+) present.          Assessment & Recommendations:   80 y.o. Caucasian male  with hypertension, h/o prostate cancer s/p prostatectomy and radiation, h/o Barrett's esophagus, chronic leg edema, now admitted with possible SIRS/allergic reaction.  Cardiology consulted for management of atrial flutter.   Atrial flutter: Rate controlled. Continue metoprolol succinate 100 mg daily. CHA2DS2VASc score at  least 3. Continue eliquis 5 mg bid.  Recommend sleep study for evaluation of OSA.  Recommend cardioversion to restore sinus rhythm, hopefullyl will also aid with management of HFpEF. If recurrence occurs, then ablation could be considered.    HFpEF: Acute on chronic, with recurrence of leg edema Normal EF, indeterminate diastolic function, elevated BNP. Change PO lasix 80 mg bid to torsemide 40 mg bid.     Nigel Mormon, MD Pager: [phone] Office: [phone]

## 2021-07-19 NOTE — Telephone Encounter (Signed)
Can you help?

## 2021-07-20 DIAGNOSIS — I5030 Unspecified diastolic (congestive) heart failure: Secondary | ICD-10-CM | POA: Diagnosis not present

## 2021-07-20 DIAGNOSIS — C61 Malignant neoplasm of prostate: Secondary | ICD-10-CM | POA: Diagnosis not present

## 2021-07-20 DIAGNOSIS — I4891 Unspecified atrial fibrillation: Secondary | ICD-10-CM | POA: Diagnosis not present

## 2021-07-20 DIAGNOSIS — A419 Sepsis, unspecified organism: Secondary | ICD-10-CM | POA: Diagnosis not present

## 2021-07-31 ENCOUNTER — Encounter: Payer: Self-pay | Admitting: Urology

## 2021-07-31 NOTE — Progress Notes (Signed)
Patient reports mild fatigue, minor urinary stream weakness, frequency, urgency, nocturia x4. Denies pain, dysuria, hematuria, diarrhea, constipation, or skin problems.  Patient has recovered from a recent urinary tract infection.  I-PSS score of 7 (mild). Meaningful use questions complete and patient notified of 9:00am telephone appointment on 08/03/21 and expressed understanding.

## 2021-08-02 NOTE — Progress Notes (Signed)
  Radiation Oncology         [phone] ________________________________  Name: Matthew [name]. MRN: [id number]  Date: 06/16/2021  DOB: 1941-09-11  End of Treatment Note  Diagnosis:   80 y.o. gentleman with oligometastatic prostate cancer involving the right hemipelvis s/p RALP and adjuvant radiotherapy in 2004     Indication for treatment:  Curative, Definitive SBRT       Radiation treatment dates:   06/06/21 - 06/16/21  Site/dose:   The targets in the right ischium and right acetabulum were treated to 50 Gy in 5 fractions of 10 Gy  Beams/energy:   The patient was treated using stereotactic body radiotherapy according to a 3D conformal radiotherapy plan.  Volumetric arc fields were employed to deliver 6 MV X-rays.  Image guidance was performed with per fraction cone beam CT prior to treatment under personal MD supervision.  Immobilization was achieved using BodyFix Pillow.  Narrative: The patient tolerated radiation treatment relatively well with only modest fatigue and no other ill side effects.  Plan: The patient has completed radiation treatment. The patient will return to radiation oncology clinic for routine followup in one month. I advised them to call or return sooner if they have any questions or concerns related to their recovery or treatment. ________________________________  Sheral Apley. Tammi Klippel, M.D.

## 2021-08-03 ENCOUNTER — Ambulatory Visit
Admission: RE | Admit: 2021-08-03 | Discharge: 2021-08-03 | Disposition: A | Discharge status: Home / Self Care | Payer: Medicare PPO | Source: Ambulatory Visit | Attending: Urology | Admitting: Urology

## 2021-08-03 DIAGNOSIS — C7951 Secondary malignant neoplasm of bone: Secondary | ICD-10-CM

## 2021-08-03 DIAGNOSIS — C61 Malignant neoplasm of prostate: Secondary | ICD-10-CM

## 2021-08-03 NOTE — Progress Notes (Signed)
Radiation Oncology         [phone]) [phone] ________________________________  Name: Matthew [name]. MRN: [id number]  Date: 08/03/2021  DOB: 1941/10/16  Post Treatment Note  CC: Wenda Low, MD  Irine Seal, MD  Diagnosis:   80 y.o. gentleman with oligometastatic prostate cancer involving the right hemipelvis s/p RALP and adjuvant radiotherapy in 2004     Interval Since Last Radiation:  6.5 weeks  06/06/21 - 06/16/21: The targets in the right ischium and right acetabulum were treated to 50 Gy in 5 fractions of 10 Gy   Narrative:  I spoke with the patient to conduct his routine scheduled 1 month follow up visit via telephone to spare the patient unnecessary potential exposure in the healthcare setting during the current COVID-19 pandemic.  The patient was notified in advance and gave permission to proceed with this visit format.  He tolerated radiation treatment relatively well with only modest fatigue and no other ill side effects.                              On review of systems, the patient states that he is doing very well in general.  He has some residual mild fatigue and minor weakened flow of stream with nocturia x4 but specifically denies dysuria, gross hematuria, straining to void, incomplete bladder emptying or incontinence.  He does not have any bony pain and denies any abdominal pain, nausea, vomiting, diarrhea or constipation.  He reports a healthy appetite and is maintaining his weight.  Overall, he is quite pleased with his progress to date.  ALLERGIES:  has No Known Allergies.  Meds: Current Outpatient Medications  Medication Sig Dispense Refill   allopurinol (ZYLOPRIM) 300 MG tablet Take 300 mg by mouth daily.      apixaban (ELIQUIS) 5 MG TABS tablet Take 1 tablet (5 mg total) by mouth 2 (two) times daily. 60 tablet 1   atorvastatin (LIPITOR) 20 MG tablet Take 20 mg by mouth daily.     esomeprazole (NEXIUM) 20 MG capsule Take 20 mg by mouth daily.      metoprolol succinate  (TOPROL-XL) 100 MG 24 hr tablet Take 100 mg by mouth daily. Take with or immediately following a meal.     Multiple Vitamin (MULTIVITAMIN WITH MINERALS) TABS tablet Take 1 tablet by mouth daily. Men +     torsemide (DEMADEX) 20 MG tablet Take 2 tablets (40 mg total) by mouth 2 (two) times daily. 120 tablet 2   No current facility-administered medications for this encounter.    Physical Findings:  vitals were not taken for this visit.  Pain Assessment Pain Score: 0-No pain/10 Unable to assess due to telephone follow-up visit format.  Lab Findings: Lab Results  Component Value Date   WBC 9.4 07/12/2021   HGB 12.4 (L) 07/12/2021   HCT 38.8 (L) 07/12/2021   MCV 96.0 07/12/2021   PLT 237 07/12/2021     Radiographic Findings: US Abdomen Complete  Result Date: 07/10/2021 CLINICAL DATA:  Sepsis EXAM: ABDOMEN ULTRASOUND COMPLETE COMPARISON:  None. FINDINGS: Gallbladder: No gallstones or wall thickening visualized. No sonographic Murphy sign noted by sonographer. Common bile duct: Diameter: Normal caliber, 6 mm. Liver: No focal lesion identified. Within normal limits in parenchymal echogenicity. Portal vein is patent on color Doppler imaging with normal direction of blood flow towards the liver. IVC: No abnormality visualized. Pancreas: Visualized portion unremarkable. Spleen: Size and appearance within normal limits. Right  Kidney: Length: Not visualized. Left Kidney: Length: 15.6 cm. Normal echotexture. Small cysts, the largest measuring 3.2 cm. Abdominal aorta: No aneurysm. Other findings: None. IMPRESSION: No acute findings. Electronically Signed   By: Rolm Baptise M.D.   On: 07/10/2021 18:48   CT Abdomen Pelvis W Contrast  Result Date: 07/10/2021 CLINICAL DATA:  Abdominal abscess/infection suspected Patient reports fever, lethargy and body aches. History of prostate cancer post radiation for bony lesions. EXAM: CT ABDOMEN AND PELVIS WITH CONTRAST TECHNIQUE: Multidetector CT imaging of the  abdomen and pelvis was performed using the standard protocol following bolus administration of intravenous contrast. CONTRAST:  90m OMNIPAQUE IOHEXOL 350 MG/ML SOLN COMPARISON:  Abdominal ultrasound earlier today. PET CT 05/23/2021, abdominal CT 04/04/2021. Abdominal MRI 04/19/2021 FINDINGS: Lower chest: Descending thoracic aorta is tortuous. There is dependent atelectasis in the medial aspect of both lower lobes. Small to moderate-sized hiatal hernia. There is minimal basilar septal thickening. Trace pleural effusions. Hepatobiliary: No focal liver abnormality is seen. No gallstones, gallbladder wall thickening, or biliary dilatation. Pancreas: No ductal dilatation or inflammation. No evidence of pancreatic mass. Spleen: There is scattered low-density lesions throughout the spleen, largest centrally measures 11 mm, other lesions are subcentimeter. Overall normal in size. Adrenals/Urinary Tract: No adrenal nodule. Chronic right renal parenchymal atrophy. There are innumerable lesions of the right kidney likely representing simple and complex renal cysts. Slight dilatation of the right renal pelvis with ureteral thickening. Nonobstructing calculi in the lower right kidney measuring up to 14 mm. There is slight compensatory hypertrophy of the left kidney. Multiple simple and complex cysts throughout the left kidney. Slight dilatation of the left renal pelvis with mild left ureteral dilatation. No obstructing stone. There is a nonobstructing 5 mm stone in the lower left kidney. Partially distended urinary bladder. No bladder wall thickening Stomach/Bowel: Bowel assessment is limited in the absence of enteric contrast. Small to moderate hiatal hernia. Stomach is nondistended further limiting assessment. There is no small bowel obstruction or inflammation. Appendix is tentatively but not definitively identified and normal. Small volume of colonic stool. Colonic diverticulosis involving the descending and sigmoid colon.  No diverticulitis. No colonic inflammation. Vascular/Lymphatic: Aortic atherosclerosis and tortuosity. No aortic aneurysm. Patent portal vein. No enlarged lymph nodes in the abdomen or pelvis. Reproductive: Prostatectomy. Other: Fat in the left greater than right inguinal canals. No ascites or free air. No abscess or abdominopelvic collection. Musculoskeletal: Stable appearance of sclerotic lesion in the right ischium. Tiny sclerotic lesion in the right acetabulum is unchanged. No acute fracture or new osseous lesion. Diffuse degenerative change in the spine IMPRESSION: 1. No acute abnormality or explanation for abdominal pain. 2. Colonic diverticulosis without diverticulitis. 3. Chronic right renal parenchymal atrophy. Bilateral nonobstructing renal calculi. Bilateral simple and complex renal cysts, recently assessed on MRI. 4. Multiple low-density lesions throughout the spleen, largest centrally measures 11 mm. These are typically benign, and not seen on prior noncontrast exams. 5. Stable sclerotic lesions in the right ischium and right acetabulum. 6. Small to moderate-sized hiatal hernia. 7. Minimal basilar septal thickening at the lung bases, may represent mild pulmonary edema. Trace pleural effusions. Aortic Atherosclerosis (ICD10-I70.0). Electronically Signed   By: MKeith RakeM.D.   On: 07/10/2021 21:53   DG Chest Port 1 View  Result Date: 07/10/2021 CLINICAL DATA:  Questionable sepsis EXAM: PORTABLE CHEST 1 VIEW COMPARISON:  11/23/2006 FINDINGS: Heart is borderline in size. Tortuosity of the thoracic aorta. No confluent airspace opacities or effusions. No acute bony abnormality. IMPRESSION: Borderline heart size.  Tortuous aorta.  No active disease. Electronically Signed   By: Rolm Baptise M.D.   On: 07/10/2021 17:45   ECHOCARDIOGRAM COMPLETE  Result Date: 07/11/2021    ECHOCARDIOGRAM REPORT   Patient Name:   Matthew [name]. Date of Exam: 07/11/2021 Medical Rec #:  [id number]        Height:        72.0 in Accession #:    [id number]       Weight:       241.0 lb Date of Birth:  [birth year]         BSA:          2.306 m Patient Age:    4 years         BP:           119/69 mmHg Patient Gender: M                HR:           53 bpm. Exam Location:  Inpatient Procedure: 2D Echo, Cardiac Doppler and Color Doppler Indications:    Atrial Flutter I48.92  History:        Patient has no prior history of Echocardiogram examinations.                 Risk Factors:Hypertension.  Sonographer:    Bernadene Person RDCS Referring Phys: [id number] Charlesetta Ivory GONFA IMPRESSIONS  1. Left ventricular ejection fraction, by estimation, is 55 to 60%. The left ventricle has normal function. The left ventricle has no regional wall motion abnormalities. There is mild left ventricular hypertrophy. Left ventricular diastolic parameters are indeterminate.  2. Right ventricular systolic function is normal. The right ventricular size is normal. There is normal pulmonary artery systolic pressure.  3. The mitral valve is normal in structure. No evidence of mitral valve regurgitation. No evidence of mitral stenosis.  4. The aortic valve is normal in structure. Aortic valve regurgitation is not visualized. No aortic stenosis is present.  5. Aortic dilatation noted. There is mild dilatation of the ascending aorta, measuring 42 mm.  6. The inferior vena cava is dilated in size with <50% respiratory variability, suggesting right atrial pressure of 15 mmHg. FINDINGS  Left Ventricle: Left ventricular ejection fraction, by estimation, is 55 to 60%. The left ventricle has normal function. The left ventricle has no regional wall motion abnormalities. The left ventricular internal cavity size was normal in size. There is  mild left ventricular hypertrophy. Left ventricular diastolic parameters are indeterminate. Right Ventricle: The right ventricular size is normal. No increase in right ventricular wall thickness. Right ventricular systolic function is normal. There  is normal pulmonary artery systolic pressure. The tricuspid regurgitant velocity is 2.04 m/s, and  with an assumed right atrial pressure of 15 mmHg, the estimated right ventricular systolic pressure is [id number] mmHg. Left Atrium: Left atrial size was normal in size. Right Atrium: Right atrial size was normal in size. Pericardium: There is no evidence of pericardial effusion. Mitral Valve: The mitral valve is normal in structure. No evidence of mitral valve regurgitation. No evidence of mitral valve stenosis. Tricuspid Valve: The tricuspid valve is normal in structure. Tricuspid valve regurgitation is trivial. No evidence of tricuspid stenosis. Aortic Valve: The aortic valve is normal in structure. Aortic valve regurgitation is not visualized. No aortic stenosis is present. Pulmonic Valve: The pulmonic valve was normal in structure. Pulmonic valve regurgitation is not visualized. No evidence of pulmonic stenosis. Aorta: The aortic root is normal  in size and structure and aortic dilatation noted. There is mild dilatation of the ascending aorta, measuring 42 mm. Venous: The inferior vena cava is dilated in size with less than 50% respiratory variability, suggesting right atrial pressure of 15 mmHg. IAS/Shunts: No atrial level shunt detected by color flow Doppler.  LEFT VENTRICLE PLAX 2D LVIDd:         4.90 cm LVIDs:         3.80 cm LV PW:         1.20 cm LV IVS:        1.10 cm LVOT diam:     2.10 cm LV SV:         67 LV SV Index:   29 LVOT Area:     3.46 cm  LV Volumes (MOD) LV vol d, MOD A2C: 100.0 ml LV vol d, MOD A4C: 132.0 ml LV vol s, MOD A2C: 40.2 ml LV vol s, MOD A4C: 63.4 ml LV SV MOD A2C:     59.8 ml LV SV MOD A4C:     132.0 ml LV SV MOD BP:      65.7 ml RIGHT VENTRICLE RV S prime:     12.00 cm/s TAPSE (M-mode): 2.0 cm LEFT ATRIUM             Index       RIGHT ATRIUM           Index LA diam:        4.00 cm 1.73 cm/m  RA Area:     22.00 cm LA Vol (A2C):   63.2 ml 27.41 ml/m RA Volume:   59.60 ml  25.84 ml/m LA  Vol (A4C):   69.6 ml 30.18 ml/m LA Biplane Vol: 67.5 ml 29.27 ml/m  AORTIC VALVE LVOT Vmax:   94.88 cm/s LVOT Vmean:  59.160 cm/s LVOT VTI:    0.193 m  AORTA Ao Root diam: 3.90 cm Ao Asc diam:  4.20 cm TRICUSPID VALVE TR Peak grad:   16.6 mmHg TR Vmax:        204.00 cm/s  SHUNTS Systemic VTI:  0.19 m Systemic Diam: 2.10 cm Candee Furbish MD Electronically signed by Candee Furbish MD Signature Date/Time: 07/11/2021/3:07:44 PM    Final     Impression/Plan: 1. 80 y.o. gentleman with oligometastatic prostate cancer involving the right hemipelvis s/p RALP and adjuvant radiotherapy in 2004. He will continue to follow up with urology for ongoing PSA determinations and has an appointment scheduled for labs on 09/20/2021 and will see Dr. Jeffie Pollock the following week. He understands what to expect with regards to PSA monitoring going forward. I will look forward to following his response to treatment via correspondence with urology, and would be happy to continue to participate in his care if clinically indicated. I encouraged him to call or return to the office if he has any questions regarding his previous radiation or possible radiation side effects. He was comfortable with this plan and will follow up as needed.       Nicholos Johns, PA-C

## 2021-08-08 ENCOUNTER — Ambulatory Visit (HOSPITAL_COMMUNITY): Payer: Medicare PPO | Admitting: Certified Registered Nurse Anesthetist

## 2021-08-08 ENCOUNTER — Ambulatory Visit (HOSPITAL_COMMUNITY)
Admission: RE | Admit: 2021-08-08 | Discharge: 2021-08-08 | Disposition: A | Discharge status: Home / Self Care | Payer: Medicare PPO | Attending: Cardiology | Admitting: Cardiology

## 2021-08-08 ENCOUNTER — Encounter (HOSPITAL_COMMUNITY)
Admission: RE | Disposition: A | Discharge status: Home / Self Care | Payer: Self-pay | Source: Home / Self Care | Attending: Cardiology

## 2021-08-08 ENCOUNTER — Encounter (HOSPITAL_COMMUNITY): Payer: Self-pay | Admitting: Cardiology

## 2021-08-08 DIAGNOSIS — I4892 Unspecified atrial flutter: Secondary | ICD-10-CM | POA: Insufficient documentation

## 2021-08-08 DIAGNOSIS — I4891 Unspecified atrial fibrillation: Secondary | ICD-10-CM | POA: Diagnosis not present

## 2021-08-08 DIAGNOSIS — Z7901 Long term (current) use of anticoagulants: Secondary | ICD-10-CM | POA: Insufficient documentation

## 2021-08-08 DIAGNOSIS — Z79899 Other long term (current) drug therapy: Secondary | ICD-10-CM | POA: Diagnosis not present

## 2021-08-08 DIAGNOSIS — I509 Heart failure, unspecified: Secondary | ICD-10-CM | POA: Diagnosis not present

## 2021-08-08 DIAGNOSIS — I110 Hypertensive heart disease with heart failure: Secondary | ICD-10-CM | POA: Diagnosis not present

## 2021-08-08 DIAGNOSIS — I5033 Acute on chronic diastolic (congestive) heart failure: Secondary | ICD-10-CM | POA: Diagnosis not present

## 2021-08-08 DIAGNOSIS — E210 Primary hyperparathyroidism: Secondary | ICD-10-CM | POA: Diagnosis not present

## 2021-08-08 DIAGNOSIS — I483 Typical atrial flutter: Secondary | ICD-10-CM | POA: Diagnosis not present

## 2021-08-08 HISTORY — PX: CARDIOVERSION: SHX1299

## 2021-08-08 LAB — POCT I-STAT, CHEM 8
BUN: 33 mg/dL — ABNORMAL HIGH (ref 8–23)
Calcium, Ion: 1.01 mmol/L — ABNORMAL LOW (ref 1.15–1.40)
Chloride: 101 mmol/L (ref 98–111)
Creatinine, Ser: 1.5 mg/dL — ABNORMAL HIGH (ref 0.61–1.24)
Glucose, Bld: 101 mg/dL — ABNORMAL HIGH (ref 70–99)
HCT: 42 % (ref 39.0–52.0)
Hemoglobin: 14.3 g/dL (ref 13.0–17.0)
Potassium: 4.1 mmol/L (ref 3.5–5.1)
Sodium: 140 mmol/L (ref 135–145)
TCO2: 31 mmol/L (ref 22–32)

## 2021-08-08 SURGERY — CARDIOVERSION
Anesthesia: General

## 2021-08-08 MED ORDER — SODIUM CHLORIDE 0.9 % IV SOLN: INTRAVENOUS | Status: DC

## 2021-08-08 MED ORDER — LIDOCAINE 2% (20 MG/ML) 5 ML SYRINGE
INTRAMUSCULAR | Status: DC | PRN
  Administered 2021-08-08: 100 mg via INTRAVENOUS

## 2021-08-08 MED ORDER — PROPOFOL 10 MG/ML IV BOLUS
INTRAVENOUS | Status: DC | PRN
  Administered 2021-08-08: 70 mg via INTRAVENOUS

## 2021-08-08 NOTE — Anesthesia Postprocedure Evaluation (Signed)
Anesthesia Post Note  Patient: Matthew [name].  Procedure(s) Performed: CARDIOVERSION     Patient location during evaluation: PACU Anesthesia Type: General Level of consciousness: sedated and patient cooperative Pain management: pain level controlled Vital Signs Assessment: post-procedure vital signs reviewed and stable Respiratory status: spontaneous breathing Cardiovascular status: stable Anesthetic complications: no   No notable events documented.  Last Vitals:  Vitals:   08/08/21 1412 08/08/21 1427  BP: 113/74 140/82  Pulse: 64 (!) 59  Resp: 17 15  Temp:    SpO2: 97% 98%    Last Pain:  Vitals:   08/08/21 1427  TempSrc:   PainSc: 0-No pain                 Nolon Nations

## 2021-08-08 NOTE — H&P (Signed)
OV 07/18/2021 copied for documentation    Follow up visit  Subjective:   Matthew [name]., male    DOB: [birth year], 80 y.o.   MRN: [id number]    HPI  Chief Complaint  Patient presents with   SIRS   Hypertension   New Patient (Initial Visit)    80 y.o. Caucasian male  with hypertension, h/o prostate cancer s/p prostatectomy and radiation, h/o Barrett's esophagus, new diagnosis of atrial flutter and HFpEF during hospital admission with SIRS/allergic reaction (07/2021)  Patient is here for follow up with his wife. He is tolerating eliquis well without any bleeding issues. Rate is controlled on metoprolol. He had improvement in leg edema with IV lasix, but has had return of edema while on PO lasix 40 mg bid. On further questioning, patient's wife reports episodes where he "stops breathing" at night.   Current Outpatient Medications on File Prior to Visit  Medication Sig Dispense Refill   allopurinol (ZYLOPRIM) 300 MG tablet Take 300 mg by mouth daily.      apixaban (ELIQUIS) 5 MG TABS tablet Take 1 tablet (5 mg total) by mouth 2 (two) times daily. 60 tablet 1   atorvastatin (LIPITOR) 20 MG tablet Take 20 mg by mouth daily.     esomeprazole (NEXIUM) 20 MG capsule Take 20 mg by mouth daily.      furosemide (LASIX) 80 MG tablet Take 80 mg by mouth 2 (two) times daily.     metoprolol succinate (TOPROL-XL) 100 MG 24 hr tablet Take 100 mg by mouth daily. Take with or immediately following a meal.     Multiple Vitamin (MULTIVITAMIN WITH MINERALS) TABS tablet Take 1 tablet by mouth daily.     No current facility-administered medications on file prior to visit.    Cardiovascular & other pertient studies:  EKG 07/18/2021: Atrial flutter with 4:1 AV block Ventricular rate 68 bpm    Recent labs: 07/12/2021: Glucose 102, BUN/Cr 18/1.26. EGFR 58. Na/K 143/3.7.  BNP 208 H/H 12/38. MCV 96. Platelets 237 TSH 2.3 normal HbA1C N/A Lipid panel N/A    Review of Systems  Cardiovascular:   Positive for leg swelling. Negative for chest pain, dyspnea on exertion, palpitations and syncope.  Respiratory:  Positive for snoring.         Vitals:   07/18/21 1031  BP: 128/74  Pulse: 67  Resp: 16  Temp: 98.3 F (36.8 C)  SpO2: 97%    Body mass index is 31.6 kg/m. Filed Weights   07/18/21 1031  Weight: 233 lb (105.7 kg)     Objective:   Physical Exam Vitals and nursing note reviewed.  Constitutional:      General: He is not in acute distress. Neck:     Vascular: No JVD.  Cardiovascular:     Rate and Rhythm: Normal rate and regular rhythm.     Heart sounds: Normal heart sounds. No murmur heard. Pulmonary:     Effort: Pulmonary effort is normal.     Breath sounds: Normal breath sounds. No wheezing or rales.  Musculoskeletal:     Right lower leg: Edema (2+) present.     Left lower leg: Edema (2+) present.          Assessment & Recommendations:   80 y.o. Caucasian male  with hypertension, h/o prostate cancer s/p prostatectomy and radiation, h/o Barrett's esophagus, chronic leg edema, now admitted with possible SIRS/allergic reaction.  Cardiology consulted for management of atrial flutter.   Atrial flutter: Rate controlled. Continue metoprolol  succinate 100 mg daily. CHA2DS2VASc score at least 3. Continue eliquis 5 mg bid.  Recommend sleep study for evaluation of OSA.  Recommend cardioversion to restore sinus rhythm, hopefullyl will also aid with management of HFpEF. If recurrence occurs, then ablation could be considered.    HFpEF: Acute on chronic, with recurrence of leg edema Normal EF, indeterminate diastolic function, elevated BNP. Change PO lasix 80 mg bid to torsemide 40 mg bid.     Nigel Mormon, MD Pager: [phone] Office: [phone]

## 2021-08-08 NOTE — CV Procedure (Signed)
Direct current cardioversion:  Indication symptomatic: Symptomatic atrial flutter  Procedure: Under deep sedation administered and monitored by anesthesiology, synchronized direct current cardioversion performed. Patient was delivered with 100 Joules of electricity X 1 with success to NSR. Patient tolerated the procedure well. No immediate complication noted.   Nigel Mormon, MD [facility] Pager: [phone] Office: [phone] If no answer Cell [phone]

## 2021-08-08 NOTE — Discharge Instructions (Signed)
Electrical Cardioversion  Electrical cardioversion is the delivery of a jolt of electricity to restore a normal rhythm to the heart. A rhythm that is too fast or is not regular keeps the heart from pumping well. In this procedure, sticky patches or metal paddles are placed on the chest to deliver electricity to the heart from a device.  What can I expect after the procedure?  Your blood pressure, heart rate, breathing rate, and blood oxygen level will be monitored until you leave the hospital or clinic.  Your heart rhythm will be watched to make sure it does not change.  You may have some redness on the skin where the shocks were given.If this occurs, can use hydrocortisone cream or Aloe vera.  Follow these instructions at home:  Do not drive for 24 hours if you were given a sedative during your procedure.  Take over-the-counter and prescription medicines only as told by your health care provider.  Ask your health care provider how to check your pulse. Check it often.  Rest for 48 hours after the procedure or as told by your health care provider.  Avoid or limit your caffeine use as told by your health care provider.  Keep all follow-up visits as told by your health care provider. This is important.  Contact a health care provider if:  You feel like your heart is beating too quickly or your pulse is not regular.  You have a serious muscle cramp that does not go away.  Get help right away if:  You have discomfort in your chest.  You are dizzy or you feel faint.  You have trouble breathing or you are short of breath.  Your speech is slurred.  You have trouble moving an arm or leg on one side of your body.  Your fingers or toes turn cold or blue.  Summary  Electrical cardioversion is the delivery of a jolt of electricity to restore a normal rhythm to the heart.  This procedure may be done right away in an emergency or may be a scheduled procedure if the condition is not  an emergency.  Generally, this is a safe procedure.  After the procedure, check your pulse often as told by your health care provider.  This information is not intended to replace advice given to you by your health care provider. Make sure you discuss any questions you have with your health care provider. Document Revised: 06/22/2019 Document Reviewed: 06/22/2019 Elsevier Patient Education  2021 [employer]  

## 2021-08-08 NOTE — Transfer of Care (Signed)
Immediate Anesthesia Transfer of Care Note  Patient: Matthew [name].  Procedure(s) Performed: CARDIOVERSION  Patient Location: Endoscopy Unit  Anesthesia Type:General  Level of Consciousness: drowsy  Airway & Oxygen Therapy: Patient Spontanous Breathing  Post-op Assessment: Report given to RN and Post -op Vital signs reviewed and stable  Post vital signs: Reviewed and stable  Last Vitals:  Vitals Value Taken Time  BP    Temp    Pulse    Resp    SpO2      Last Pain:  Vitals:   08/08/21 1230  TempSrc: Temporal  PainSc: 0-No pain         Complications: No notable events documented.

## 2021-08-08 NOTE — Interval H&P Note (Signed)
History and Physical Interval Note:  08/08/2021 1:38 PM  Matthew [name].  has presented today for surgery, with the diagnosis of AFIB.  The various methods of treatment have been discussed with the patient and family. After consideration of risks, benefits and other options for treatment, the patient has consented to  Procedure(s): CARDIOVERSION (N/A) as a surgical intervention.  The patient's history has been reviewed, patient examined, no change in status, stable for surgery.  I have reviewed the patient's chart and labs.  Questions were answered to the patient's satisfaction.      [place]

## 2021-08-08 NOTE — Anesthesia Preprocedure Evaluation (Addendum)
Anesthesia Evaluation  Patient identified by MRN, date of birth, ID band Patient awake    Reviewed: Allergy & Precautions, NPO status , Patient's Chart, lab work & pertinent test results  History of Anesthesia Complications Negative for: history of anesthetic complications  Airway Mallampati: II  TM Distance: >3 FB Neck ROM: Full    Dental  (+) Chipped, Dental Advisory Given,    Pulmonary neg pulmonary ROS,    Pulmonary exam normal breath sounds clear to auscultation       Cardiovascular hypertension, Pt. on medications and Pt. on home beta blockers (-) angina(-) Past MI and (-) CHF Normal cardiovascular exam Rhythm:Regular Rate:Normal  Echo 07/2021 1. Left ventricular ejection fraction, by estimation, is 55 to 60%. The left ventricle has normal function. The left ventricle has no regional wall motion abnormalities. There is mild left ventricular hypertrophy. Left ventricular diastolic parameters are indeterminate.  2. Right ventricular systolic function is normal. The right ventricular size is normal. There is normal pulmonary artery systolic pressure.  3. The mitral valve is normal in structure. No evidence of mitral valve regurgitation. No evidence of mitral stenosis.  4. The aortic valve is normal in structure. Aortic valve regurgitation is not visualized. No aortic stenosis is present.  5. Aortic dilatation noted. There is mild dilatation of the ascending aorta, measuring 42 mm.  6. The inferior vena cava is dilated in size with <50% respiratory variability, suggesting right atrial pressure of 15 mmHg.    Neuro/Psych negative neurological ROS  negative psych ROS   GI/Hepatic Neg liver ROS, GERD  Medicated and Controlled,  Endo/Other  negative endocrine ROS  Renal/GU negative Renal ROS     Musculoskeletal   Abdominal   Peds  Hematology negative hematology ROS (+)   Anesthesia Other Findings    Reproductive/Obstetrics                            Anesthesia Physical  Anesthesia Plan  ASA: 3  Anesthesia Plan: MAC   Post-op Pain Management:    Induction: Intravenous  PONV Risk Score and Plan: 2 and Treatment may vary due to age or medical condition, TIVA and Propofol infusion  Airway Management Planned: Natural Airway and Mask  Additional Equipment:   Intra-op Plan:   Post-operative Plan:   Informed Consent: I have reviewed the patients History and Physical, chart, labs and discussed the procedure including the risks, benefits and alternatives for the proposed anesthesia with the patient or authorized representative who has indicated his/her understanding and acceptance.     Dental advisory given  Plan Discussed with: CRNA  Anesthesia Plan Comments:        Anesthesia Quick Evaluation

## 2021-08-08 NOTE — Anesthesia Procedure Notes (Signed)
Procedure Name: General with mask airway Date/Time: 08/08/2021 1:59 PM Performed by: Valda Favia, CRNA Pre-anesthesia Checklist: Patient identified, Emergency Drugs available, Suction available, Patient being monitored and Timeout performed Patient Re-evaluated:Patient Re-evaluated prior to induction Oxygen Delivery Method: Ambu bag Preoxygenation: Pre-oxygenation with 100% oxygen Induction Type: IV induction Placement Confirmation: positive ETCO2 Dental Injury: Teeth and Oropharynx as per pre-operative assessment

## 2021-08-09 ENCOUNTER — Encounter (HOSPITAL_COMMUNITY): Payer: Self-pay | Admitting: Cardiology

## 2021-08-15 LAB — POCT I-STAT, CHEM 8
BUN: 39 mg/dL — ABNORMAL HIGH (ref 8–23)
Calcium, Ion: 0.97 mmol/L — ABNORMAL LOW (ref 1.15–1.40)
Chloride: 102 mmol/L (ref 98–111)
Creatinine, Ser: 1.5 mg/dL — ABNORMAL HIGH (ref 0.61–1.24)
Glucose, Bld: 103 mg/dL — ABNORMAL HIGH (ref 70–99)
HCT: 44 % (ref 39.0–52.0)
Hemoglobin: 15 g/dL (ref 13.0–17.0)
Potassium: 6.9 mmol/L (ref 3.5–5.1)
Sodium: 137 mmol/L (ref 135–145)
TCO2: 31 mmol/L (ref 22–32)

## 2021-08-21 DIAGNOSIS — B349 Viral infection, unspecified: Secondary | ICD-10-CM | POA: Diagnosis not present

## 2021-08-21 DIAGNOSIS — R059 Cough, unspecified: Secondary | ICD-10-CM | POA: Diagnosis not present

## 2021-08-21 DIAGNOSIS — Z03818 Encounter for observation for suspected exposure to other biological agents ruled out: Secondary | ICD-10-CM | POA: Diagnosis not present

## 2021-08-21 DIAGNOSIS — R6883 Chills (without fever): Secondary | ICD-10-CM | POA: Diagnosis not present

## 2021-08-24 ENCOUNTER — Ambulatory Visit: Payer: Medicare PPO | Admitting: Cardiology

## 2021-08-24 ENCOUNTER — Other Ambulatory Visit: Payer: Self-pay

## 2021-08-24 ENCOUNTER — Encounter: Payer: Self-pay | Admitting: Cardiology

## 2021-08-24 VITALS — BP 121/89 | HR 89 | Temp 98.5°F | Resp 16 | Ht 72.0 in | Wt 227.0 lb

## 2021-08-24 DIAGNOSIS — I5032 Chronic diastolic (congestive) heart failure: Secondary | ICD-10-CM

## 2021-08-24 DIAGNOSIS — I483 Typical atrial flutter: Secondary | ICD-10-CM | POA: Diagnosis not present

## 2021-08-24 DIAGNOSIS — I10 Essential (primary) hypertension: Secondary | ICD-10-CM | POA: Diagnosis not present

## 2021-08-24 NOTE — Progress Notes (Signed)
Follow up visit  Subjective:   [name]., male    DOB: 1941-11-17, 80 y.o.   MRN: [id number]    HPI  No chief complaint on file.   80 y.o. Caucasian male  with hypertension, h/o prostate cancer s/p prostatectomy and radiation, h/o Barrett's esophagus, new diagnosis of atrial flutter and HFpEF during hospital admission with SIRS/allergic reaction (07/2021)  Patient underwent successful cardioversion on 08/08/2021.  Since then, he has not complained of any palpitations.  Leg swelling is controlled with torsemide.  However, has not taken his torsemide today.  For last couple days, patient has had complaints of dry cough and chills.  He was reportedly tested negative for COVID earlier this week.  Current Outpatient Medications on File Prior to Visit  Medication Sig Dispense Refill   allopurinol (ZYLOPRIM) 300 MG tablet Take 300 mg by mouth daily.      apixaban (ELIQUIS) 5 MG TABS tablet Take 1 tablet (5 mg total) by mouth 2 (two) times daily. 60 tablet 1   atorvastatin (LIPITOR) 20 MG tablet Take 20 mg by mouth daily.     esomeprazole (NEXIUM) 20 MG capsule Take 20 mg by mouth daily.      metoprolol succinate (TOPROL-XL) 100 MG 24 hr tablet Take 100 mg by mouth daily. Take with or immediately following a meal.     Multiple Vitamin (MULTIVITAMIN WITH MINERALS) TABS tablet Take 1 tablet by mouth daily. Men +     torsemide (DEMADEX) 20 MG tablet Take 2 tablets (40 mg total) by mouth 2 (two) times daily. 120 tablet 2   No current facility-administered medications on file prior to visit.    Cardiovascular & other pertient studies:  EKG 08/24/2021: Sinus rhythm 83 bpm First degree A-V block  Incomplete RBBB  Cardioversion 08/08/2021: 100JX1 Successful  EKG 07/18/2021: Atrial flutter with 4:1 AV block Ventricular rate 68 bpm    Recent labs: 07/12/2021: Glucose 102, BUN/Cr 18/1.26. EGFR 58. Na/K 143/3.7.  BNP 208 H/H 12/38. MCV 96. Platelets 237 TSH 2.3 normal HbA1C N/A Lipid  panel N/A    Review of Systems  Constitutional: Positive for chills. Negative for fever.  Cardiovascular:  Positive for leg swelling. Negative for chest pain, dyspnea on exertion, palpitations and syncope.  Respiratory:  Positive for cough and snoring.         Vitals:   08/24/21 1118  BP: 121/89  Pulse: 89  Resp: 16  Temp: 98.5 F (36.9 C)  SpO2: 95%    Body mass index is 30.79 kg/m. Filed Weights   08/24/21 1118  Weight: 227 lb (103 kg)     Objective:   Physical Exam Vitals and nursing note reviewed.  Constitutional:      General: He is not in acute distress. Neck:     Vascular: No JVD.  Cardiovascular:     Rate and Rhythm: Normal rate and regular rhythm.     Heart sounds: Normal heart sounds. No murmur heard. Pulmonary:     Effort: Pulmonary effort is normal.     Breath sounds: Normal breath sounds. No wheezing or rales.  Musculoskeletal:     Right lower leg: Edema (2+) present.     Left lower leg: Edema (2+) present.          Assessment & Recommendations:   80 y.o. Caucasian male  with hypertension, h/o prostate cancer s/p prostatectomy and radiation, h/o Barrett's esophagus, chronic leg edema, now admitted with possible SIRS/allergic reaction.  Cardiology consulted for management  of atrial flutter.   Atrial flutter: Now in sinus rhythm s/p cardioversion 08/08/2021 Continue metoprolol succinate 100 mg daily. CHA2DS2VASc score at least 3. Continue eliquis 5 mg bid for now. Follow-up in 4 weeks.  If no recurrence at that time, may be able to discontinue in 4 weeks..  Recommend sleep study for evaluation of OSA.   Cough: Recommend follow-up with PCP.  I suspect patient may have upper respiratory tract infection.  May need further work-up.   HFpEF: Continue torsemide 40 mg bid.     Nigel Mormon, MD Pager: [phone] Office: [phone]

## 2021-09-19 ENCOUNTER — Other Ambulatory Visit: Payer: Self-pay

## 2021-09-19 ENCOUNTER — Telehealth: Payer: Self-pay | Admitting: Cardiology

## 2021-09-19 DIAGNOSIS — I5033 Acute on chronic diastolic (congestive) heart failure: Secondary | ICD-10-CM

## 2021-09-19 MED ORDER — TORSEMIDE 20 MG PO TABS: 40.0000 mg | ORAL_TABLET | Freq: Two times a day (BID) | ORAL | 2 refills | Status: DC

## 2021-09-19 NOTE — Telephone Encounter (Signed)
Patient request refill for torsemide

## 2021-09-19 NOTE — Telephone Encounter (Signed)
Done

## 2021-09-20 DIAGNOSIS — N3020 Other chronic cystitis without hematuria: Secondary | ICD-10-CM | POA: Diagnosis not present

## 2021-09-20 DIAGNOSIS — C61 Malignant neoplasm of prostate: Secondary | ICD-10-CM | POA: Diagnosis not present

## 2021-09-27 DIAGNOSIS — N393 Stress incontinence (female) (male): Secondary | ICD-10-CM | POA: Diagnosis not present

## 2021-09-27 DIAGNOSIS — C61 Malignant neoplasm of prostate: Secondary | ICD-10-CM | POA: Diagnosis not present

## 2021-09-27 DIAGNOSIS — N3020 Other chronic cystitis without hematuria: Secondary | ICD-10-CM | POA: Diagnosis not present

## 2021-09-27 DIAGNOSIS — C7951 Secondary malignant neoplasm of bone: Secondary | ICD-10-CM | POA: Diagnosis not present

## 2021-09-28 ENCOUNTER — Ambulatory Visit: Payer: Medicare PPO | Admitting: Cardiology

## 2021-09-28 ENCOUNTER — Other Ambulatory Visit: Payer: Self-pay

## 2021-09-28 ENCOUNTER — Encounter: Payer: Self-pay | Admitting: Cardiology

## 2021-09-28 VITALS — BP 115/79 | HR 81 | Temp 98.0°F | Resp 17 | Ht 72.0 in | Wt 225.0 lb

## 2021-09-28 DIAGNOSIS — I483 Typical atrial flutter: Secondary | ICD-10-CM

## 2021-09-28 DIAGNOSIS — I10 Essential (primary) hypertension: Secondary | ICD-10-CM | POA: Diagnosis not present

## 2021-09-28 DIAGNOSIS — I5032 Chronic diastolic (congestive) heart failure: Secondary | ICD-10-CM | POA: Diagnosis not present

## 2021-09-28 MED ORDER — TORSEMIDE 40 MG PO TABS: 40.0000 mg | ORAL_TABLET | Freq: Two times a day (BID) | ORAL | 3 refills | Status: DC

## 2021-09-28 MED ORDER — POTASSIUM CHLORIDE CRYS ER 20 MEQ PO TBCR
20.0000 meq | EXTENDED_RELEASE_TABLET | Freq: Every day | ORAL | 3 refills | Status: DC
Start: 2021-09-28 — End: 2022-07-25

## 2021-09-28 NOTE — Progress Notes (Signed)
Follow up visit  Subjective:   Matthew [name]., male    DOB: 01-24-1941, 80 y.o.   MRN: [id number]    HPI  Chief Complaint  Patient presents with   Typical atrial flutter   Acute on chronic heart failure with preserved ejection frac   Follow-up    4 week    80 y.o. Caucasian male  with hypertension, h/o prostate cancer s/p prostatectomy and radiation, h/o Barrett's esophagus, new diagnosis of atrial flutter and HFpEF during hospital admission with SIRS/allergic reaction (07/2021)  Patient underwent successful cardioversion on 08/08/2021.  Since then, he has not complained of any palpitations.  Leg swelling is controlled with torsemide. However, recently, he ran out of torsemide.    Current Outpatient Medications on File Prior to Visit  Medication Sig Dispense Refill   allopurinol (ZYLOPRIM) 300 MG tablet Take 300 mg by mouth daily.      apixaban (ELIQUIS) 5 MG TABS tablet Take 1 tablet (5 mg total) by mouth 2 (two) times daily. 60 tablet 1   atorvastatin (LIPITOR) 20 MG tablet Take 20 mg by mouth daily.     esomeprazole (NEXIUM) 20 MG capsule Take 20 mg by mouth daily.      metoprolol succinate (TOPROL-XL) 100 MG 24 hr tablet Take 100 mg by mouth daily. Take with or immediately following a meal.     Multiple Vitamin (MULTIVITAMIN WITH MINERALS) TABS tablet Take 1 tablet by mouth daily. Men +     torsemide (DEMADEX) 20 MG tablet Take 2 tablets (40 mg total) by mouth 2 (two) times daily. 120 tablet 2   No current facility-administered medications on file prior to visit.    Cardiovascular & other pertient studies:  EKG 09/28/2021: Sinus rhythm 99 bpm First degree AV block RSR(V1) -nondiagnostic  Cardioversion 08/08/2021: 100JX1 Successful  EKG 07/18/2021: Atrial flutter with 4:1 AV block Ventricular rate 68 bpm    Recent labs: 07/12/2021: Glucose 102, BUN/Cr 18/1.26. EGFR 58. Na/K 143/3.7.  BNP 208 H/H 12/38. MCV 96. Platelets 237 TSH 2.3 normal HbA1C N/A Lipid panel  N/A    Review of Systems  Constitutional: Positive for chills. Negative for fever.  Cardiovascular:  Positive for leg swelling. Negative for chest pain, dyspnea on exertion, palpitations and syncope.  Respiratory:  Positive for cough and snoring.         Vitals:   09/28/21 1423  BP: 115/79  Pulse: 81  Resp: 17  Temp: 98 F (36.7 C)  SpO2: 98%    Body mass index is 30.52 kg/m. Filed Weights   09/28/21 1423  Weight: 225 lb (102.1 kg)     Objective:   Physical Exam Vitals and nursing note reviewed.  Constitutional:      General: He is not in acute distress. Neck:     Vascular: No JVD.  Cardiovascular:     Rate and Rhythm: Normal rate and regular rhythm.     Heart sounds: Normal heart sounds. No murmur heard. Pulmonary:     Effort: Pulmonary effort is normal.     Breath sounds: Normal breath sounds. No wheezing or rales.  Musculoskeletal:     Right lower leg: Edema (1+) present.     Left lower leg: Edema (1+) present.          Assessment & Recommendations:   80 y.o. Caucasian male  with hypertension, h/o prostate cancer s/p prostatectomy and radiation, h/o Barrett's esophagus, chronic leg edema, now admitted with possible SIRS/allergic reaction.  Cardiology consulted for  management of atrial flutter.    Atrial flutter: CHA2DS2VASc score at least 3, but with no recurrence of atrial flutter Now in sinus rhythm on two EKGs 4 weeks apart, s/p cardioversion 08/08/2021 Continue metoprolol succinate 100 mg daily. Recommend sleep study for evaluation of OSA.   HFpEF: Continue torsemide 40 mg bid.  Added Kdur 20 mEq Abs in 6 weeks  F/u in 3 months   Nigel Mormon, MD Pager: [phone] Office: [phone]

## 2021-10-30 DIAGNOSIS — I5032 Chronic diastolic (congestive) heart failure: Secondary | ICD-10-CM | POA: Diagnosis not present

## 2021-10-31 LAB — BASIC METABOLIC PANEL
BUN/Creatinine Ratio: 17 (ref 10–24)
BUN: 32 mg/dL — ABNORMAL HIGH (ref 8–27)
CO2: 22 mmol/L (ref 20–29)
Calcium: 9.1 mg/dL (ref 8.6–10.2)
Chloride: 103 mmol/L (ref 96–106)
Creatinine, Ser: 1.83 mg/dL — ABNORMAL HIGH (ref 0.76–1.27)
Glucose: 111 mg/dL — ABNORMAL HIGH (ref 70–99)
Potassium: 3.7 mmol/L (ref 3.5–5.2)
Sodium: 141 mmol/L (ref 134–144)
eGFR: 37 mL/min/{1.73_m2} — ABNORMAL LOW (ref >59)

## 2021-10-31 LAB — BRAIN NATRIURETIC PEPTIDE: BNP: 125.5 pg/mL — ABNORMAL HIGH (ref 0.0–100.0)

## 2021-11-02 DIAGNOSIS — N1832 Chronic kidney disease, stage 3b: Secondary | ICD-10-CM | POA: Diagnosis not present

## 2021-11-02 DIAGNOSIS — N200 Calculus of kidney: Secondary | ICD-10-CM | POA: Diagnosis not present

## 2021-11-02 DIAGNOSIS — Z8546 Personal history of malignant neoplasm of prostate: Secondary | ICD-10-CM | POA: Diagnosis not present

## 2021-11-02 DIAGNOSIS — D351 Benign neoplasm of parathyroid gland: Secondary | ICD-10-CM | POA: Diagnosis not present

## 2021-11-02 DIAGNOSIS — I129 Hypertensive chronic kidney disease with stage 1 through stage 4 chronic kidney disease, or unspecified chronic kidney disease: Secondary | ICD-10-CM | POA: Diagnosis not present

## 2021-12-07 DIAGNOSIS — N1832 Chronic kidney disease, stage 3b: Secondary | ICD-10-CM | POA: Diagnosis not present

## 2021-12-22 ENCOUNTER — Other Ambulatory Visit: Payer: Self-pay | Admitting: Nephrology

## 2021-12-22 DIAGNOSIS — N200 Calculus of kidney: Secondary | ICD-10-CM

## 2021-12-25 ENCOUNTER — Telehealth: Payer: Self-pay | Admitting: Cardiology

## 2021-12-25 ENCOUNTER — Other Ambulatory Visit: Payer: Self-pay | Admitting: Cardiology

## 2021-12-25 ENCOUNTER — Other Ambulatory Visit: Payer: Self-pay

## 2021-12-25 DIAGNOSIS — I5032 Chronic diastolic (congestive) heart failure: Secondary | ICD-10-CM

## 2021-12-25 MED ORDER — TORSEMIDE 40 MG PO TABS
40.0000 mg | ORAL_TABLET | Freq: Two times a day (BID) | ORAL | 3 refills | Status: DC
Start: 2021-12-25 — End: 2022-02-23

## 2021-12-25 NOTE — Telephone Encounter (Signed)
Pt requesting refill for torsemide.

## 2021-12-27 NOTE — Telephone Encounter (Signed)
Medication is not covered. Pharmacy would like to know if we can increase the dose of Torsemide instead.

## 2021-12-29 ENCOUNTER — Ambulatory Visit
Admission: RE | Admit: 2021-12-29 | Discharge: 2021-12-29 | Disposition: A | Discharge status: Home / Self Care | Payer: Medicare PPO | Source: Ambulatory Visit | Attending: Nephrology | Admitting: Nephrology

## 2021-12-29 ENCOUNTER — Ambulatory Visit: Payer: Medicare PPO | Admitting: Cardiology

## 2021-12-29 ENCOUNTER — Other Ambulatory Visit: Payer: Self-pay

## 2021-12-29 ENCOUNTER — Encounter: Payer: Self-pay | Admitting: Cardiology

## 2021-12-29 VITALS — BP 124/85 | HR 87 | Ht 72.0 in | Wt 225.0 lb

## 2021-12-29 DIAGNOSIS — N132 Hydronephrosis with renal and ureteral calculous obstruction: Secondary | ICD-10-CM | POA: Diagnosis not present

## 2021-12-29 DIAGNOSIS — I5032 Chronic diastolic (congestive) heart failure: Secondary | ICD-10-CM

## 2021-12-29 DIAGNOSIS — I10 Essential (primary) hypertension: Secondary | ICD-10-CM

## 2021-12-29 DIAGNOSIS — N200 Calculus of kidney: Secondary | ICD-10-CM

## 2021-12-29 DIAGNOSIS — I483 Typical atrial flutter: Secondary | ICD-10-CM

## 2021-12-29 DIAGNOSIS — N281 Cyst of kidney, acquired: Secondary | ICD-10-CM | POA: Diagnosis not present

## 2021-12-29 NOTE — Progress Notes (Signed)
Follow up visit  Subjective:   Matthew Lauber., male    DOB: 1941/08/05, 81 y.o.   MRN: [id number]    HPI  Chief Complaint  Patient presents with   Atrial Flutter   Follow-up    3 month    81 y.o. Caucasian male  with hypertension, h/o prostate cancer s/p prostatectomy and radiation, h/o Barrett's esophagus, new diagnosis of atrial flutter and HFpEF during hospital admission with SIRS/allergic reaction (07/2021)  Patient underwent successful cardioversion on 08/08/2021.  Since then, he has not complained of any palpitations.  Leg swelling is controlled with torsemide-he currently takes 80 mg in am and 40 mg in om. He has close f/u w/nephrology given his renal dysfunction, most recent Cr in 10/2022 was 1.83. He is going to undergo renal ultrasound today.    Current Outpatient Medications on File Prior to Visit  Medication Sig Dispense Refill   allopurinol (ZYLOPRIM) 300 MG tablet Take 300 mg by mouth daily.      atorvastatin (LIPITOR) 20 MG tablet Take 20 mg by mouth daily.     esomeprazole (NEXIUM) 20 MG capsule Take 20 mg by mouth daily.      metoprolol succinate (TOPROL-XL) 100 MG 24 hr tablet Take 100 mg by mouth daily. Take with or immediately following a meal.     Multiple Vitamin (MULTIVITAMIN WITH MINERALS) TABS tablet Take 1 tablet by mouth daily. Men +     potassium chloride SA (KLOR-CON) 20 MEQ tablet Take 1 tablet (20 mEq total) by mouth daily. 90 tablet 3   Torsemide 40 MG TABS Take 40 mg by mouth 2 (two) times daily. 180 tablet 3   No current facility-administered medications on file prior to visit.    Cardiovascular & other pertient studies:  EKG 12/29/2021: Probable sinus rhythm 82 bpm   First degree A-V block PRi 298 msec Early R wave transition No change compared to previous EKG  Cardioversion 08/08/2021: 100JX1 Successful  Echocardiogram 07/11/2021:  1. Left ventricular ejection fraction, by estimation, is 55 to 60%. The  left ventricle has normal  function. The left ventricle has no regional  wall motion abnormalities. There is mild left ventricular hypertrophy.  Left ventricular diastolic parameters are indeterminate.   2. Right ventricular systolic function is normal. The right ventricular  size is normal. There is normal pulmonary artery systolic pressure.   3. The mitral valve is normal in structure. No evidence of mitral valve  regurgitation. No evidence of mitral stenosis.   4. The aortic valve is normal in structure. Aortic valve regurgitation is  not visualized. No aortic stenosis is present.   5. Aortic dilatation noted. There is mild dilatation of the ascending  aorta, measuring 42 mm.   6. The inferior vena cava is dilated in size with <50% respiratory  variability, suggesting right atrial pressure of 15 mmHg.   EKG 07/18/2021: Atrial flutter with 4:1 AV block Ventricular rate 68 bpm    Recent labs: 10/30/2021: Glucose 111, BUN/Cr 32/1.83. EGFR 37. Na/K 141/3.7.  BNP 125  07/12/2021: Glucose 102, BUN/Cr 18/1.26. EGFR 58. Na/K 143/3.7.  BNP 208 H/H 12/38. MCV 96. Platelets 237 TSH 2.3 normal HbA1C N/A Lipid panel N/A    Review of Systems  Constitutional: Positive for chills. Negative for fever.  Cardiovascular:  Positive for leg swelling. Negative for chest pain, dyspnea on exertion, palpitations and syncope.  Respiratory:  Positive for cough and snoring.         Vitals:   12/29/21  1322  BP: 124/85  Pulse: 87  SpO2: 97%    Body mass index is 30.52 kg/m. Filed Weights   12/29/21 1322  Weight: 225 lb (102.1 kg)     Objective:   Physical Exam Vitals and nursing note reviewed.  Constitutional:      General: He is not in acute distress. Neck:     Vascular: No JVD.  Cardiovascular:     Rate and Rhythm: Normal rate and regular rhythm.     Heart sounds: Normal heart sounds. No murmur heard. Pulmonary:     Effort: Pulmonary effort is normal.     Breath sounds: Normal breath sounds. No  wheezing or rales.  Musculoskeletal:     Right lower leg: Edema (1+) present.     Left lower leg: Edema (1+) present.          Assessment & Recommendations:   81 y.o. Caucasian male  with hypertension, h/o prostate cancer s/p prostatectomy and radiation, h/o Barrett's esophagus, chronic leg edema, now admitted with possible SIRS/allergic reaction.  Cardiology consulted for management of atrial flutter.   Atrial flutter: CHA2DS2VASc score at least 3, but with no recurrence of atrial flutter-thus not on anticoagulation Now in sinus rhythm on two EKGs 4 weeks apart, s/p cardioversion 08/08/2021 Continue metoprolol succinate 100 mg daily. His EKG has showed long first degree AV block without any change or symptoms. Recommend sleep study for evaluation of OSA.   HFpEF: Persistent leg edema, no other symptoms. I am concerned about his renal dysfunction, therefore not made any change to his diuretics  F/u in 3 months   Nigel Mormon, MD Pager: [phone] Office: [phone]

## 2022-01-08 DIAGNOSIS — N281 Cyst of kidney, acquired: Secondary | ICD-10-CM | POA: Diagnosis not present

## 2022-01-08 DIAGNOSIS — N3020 Other chronic cystitis without hematuria: Secondary | ICD-10-CM | POA: Diagnosis not present

## 2022-01-08 DIAGNOSIS — N200 Calculus of kidney: Secondary | ICD-10-CM | POA: Diagnosis not present

## 2022-01-12 DIAGNOSIS — N200 Calculus of kidney: Secondary | ICD-10-CM | POA: Diagnosis not present

## 2022-01-12 DIAGNOSIS — K449 Diaphragmatic hernia without obstruction or gangrene: Secondary | ICD-10-CM | POA: Diagnosis not present

## 2022-01-12 DIAGNOSIS — N261 Atrophy of kidney (terminal): Secondary | ICD-10-CM | POA: Diagnosis not present

## 2022-01-12 DIAGNOSIS — N281 Cyst of kidney, acquired: Secondary | ICD-10-CM | POA: Diagnosis not present

## 2022-01-12 DIAGNOSIS — K5730 Diverticulosis of large intestine without perforation or abscess without bleeding: Secondary | ICD-10-CM | POA: Diagnosis not present

## 2022-01-12 DIAGNOSIS — N132 Hydronephrosis with renal and ureteral calculous obstruction: Secondary | ICD-10-CM | POA: Diagnosis not present

## 2022-01-23 ENCOUNTER — Other Ambulatory Visit: Payer: Self-pay | Admitting: Urology

## 2022-01-23 NOTE — Progress Notes (Signed)
Patient to arrive at 1315 on 01/25/2022. History and medications reviewed. Pre-procedure instructions given. NPO after 0915 on day of procedure except for clear liquids until 1115. Driver secured.

## 2022-01-25 ENCOUNTER — Encounter (HOSPITAL_BASED_OUTPATIENT_CLINIC_OR_DEPARTMENT_OTHER): Payer: Self-pay | Admitting: Urology

## 2022-01-25 ENCOUNTER — Encounter (HOSPITAL_BASED_OUTPATIENT_CLINIC_OR_DEPARTMENT_OTHER)
Admission: RE | Disposition: A | Discharge status: Home / Self Care | Payer: Self-pay | Source: Home / Self Care | Attending: Urology

## 2022-01-25 ENCOUNTER — Ambulatory Visit (HOSPITAL_COMMUNITY): Payer: Medicare PPO

## 2022-01-25 ENCOUNTER — Ambulatory Visit (HOSPITAL_BASED_OUTPATIENT_CLINIC_OR_DEPARTMENT_OTHER)
Admission: RE | Admit: 2022-01-25 | Discharge: 2022-01-25 | Disposition: A | Discharge status: Home / Self Care | Payer: Medicare PPO | Attending: Urology | Admitting: Urology

## 2022-01-25 DIAGNOSIS — N200 Calculus of kidney: Secondary | ICD-10-CM | POA: Diagnosis not present

## 2022-01-25 DIAGNOSIS — N202 Calculus of kidney with calculus of ureter: Secondary | ICD-10-CM | POA: Diagnosis not present

## 2022-01-25 DIAGNOSIS — N201 Calculus of ureter: Secondary | ICD-10-CM

## 2022-01-25 DIAGNOSIS — I878 Other specified disorders of veins: Secondary | ICD-10-CM | POA: Diagnosis not present

## 2022-01-25 DIAGNOSIS — Z01818 Encounter for other preprocedural examination: Secondary | ICD-10-CM | POA: Diagnosis not present

## 2022-01-25 HISTORY — PX: EXTRACORPOREAL SHOCK WAVE LITHOTRIPSY: SHX1557

## 2022-01-25 SURGERY — LITHOTRIPSY, ESWL
Anesthesia: LOCAL | Laterality: Left

## 2022-01-25 MED ORDER — TAMSULOSIN HCL 0.4 MG PO CAPS
0.4000 mg | ORAL_CAPSULE | Freq: Every day | ORAL | 0 refills | Status: DC
Start: 2022-01-25 — End: 2022-03-29

## 2022-01-25 MED ORDER — DIPHENHYDRAMINE HCL 25 MG PO CAPS
ORAL_CAPSULE | ORAL | Status: AC
  Filled 2022-01-25: qty 1

## 2022-01-25 MED ORDER — DIAZEPAM 5 MG PO TABS
10.0000 mg | ORAL_TABLET | ORAL | Status: AC
  Administered 2022-01-25: 10 mg via ORAL

## 2022-01-25 MED ORDER — CIPROFLOXACIN HCL 500 MG PO TABS
ORAL_TABLET | ORAL | Status: AC
  Filled 2022-01-25: qty 1

## 2022-01-25 MED ORDER — CEPHALEXIN 500 MG PO CAPS: 500.0000 mg | ORAL_CAPSULE | Freq: Two times a day (BID) | ORAL | 0 refills | Status: DC

## 2022-01-25 MED ORDER — CIPROFLOXACIN HCL 500 MG PO TABS
500.0000 mg | ORAL_TABLET | ORAL | Status: AC
  Administered 2022-01-25: 500 mg via ORAL

## 2022-01-25 MED ORDER — HYDROCODONE-ACETAMINOPHEN 5-325 MG PO TABS: 1.0000 | ORAL_TABLET | Freq: Four times a day (QID) | ORAL | 0 refills | Status: DC | PRN

## 2022-01-25 MED ORDER — SODIUM CHLORIDE 0.9 % IV SOLN: INTRAVENOUS | Status: DC

## 2022-01-25 MED ORDER — DIAZEPAM 5 MG PO TABS
ORAL_TABLET | ORAL | Status: AC
  Filled 2022-01-25: qty 2

## 2022-01-25 MED ORDER — CELECOXIB 100 MG PO CAPS: 100.0000 mg | ORAL_CAPSULE | Freq: Two times a day (BID) | ORAL | 1 refills | Status: DC | PRN

## 2022-01-25 MED ORDER — DIPHENHYDRAMINE HCL 25 MG PO CAPS
25.0000 mg | ORAL_CAPSULE | ORAL | Status: AC
  Administered 2022-01-25: 25 mg via ORAL

## 2022-01-25 NOTE — H&P (Signed)
H&P  History of Present Illness: Matthew [name]. is a 81 y.o. year old who presents today for a L ESWL for a distal L ureteral stone.  Past Medical History:  Diagnosis Date   Barrett's esophagus    Cancer ([place]) 2004   prostate    GERD (gastroesophageal reflux disease)    pt denies   History of kidney stones    Hypertension    Pneumonia    In high school   Prostate cancer [facility])     Past Surgical History:  Procedure Laterality Date   CARDIOVERSION N/A 08/08/2021   Procedure: CARDIOVERSION;  Surgeon: Nigel Mormon, MD;  Location: [facility];  Service: Cardiovascular;  Laterality: N/A;   COLONOSCOPY WITH PROPOFOL N/A 01/09/2016   Procedure: COLONOSCOPY WITH PROPOFOL;  Surgeon: Garlan Fair, MD;  Location: [facility];  Service: Endoscopy;  Laterality: N/A;   CYSTOSCOPY     x 1   ESOPHAGOGASTRODUODENOSCOPY (EGD) WITH PROPOFOL N/A 01/09/2016   Procedure: ESOPHAGOGASTRODUODENOSCOPY (EGD) WITH PROPOFOL;  Surgeon: Garlan Fair, MD;  Location: [facility];  Service: Endoscopy;  Laterality: N/A;   EYE SURGERY     bil cataract   KNEE ARTHROSCOPY Left 25 yrs ago   PARATHYROIDECTOMY N/A 02/25/2020   Procedure: NECK EXPLORATION WITH PARATHYROIDECTOMY;  Surgeon: Armandina Gemma, MD;  Location: [facility];  Service: General;  Laterality: N/A;   PROSTATECTOMY  2004    Home Medications:  Current Meds  Medication Sig   allopurinol (ZYLOPRIM) 300 MG tablet Take 300 mg by mouth daily.    atorvastatin (LIPITOR) 20 MG tablet Take 20 mg by mouth daily.   esomeprazole (NEXIUM) 20 MG capsule Take 20 mg by mouth daily.    metoprolol succinate (TOPROL-XL) 100 MG 24 hr tablet Take 100 mg by mouth daily. Take with or immediately following a meal.   Multiple Vitamin (MULTIVITAMIN WITH MINERALS) TABS tablet Take 1 tablet by mouth daily. Men +   potassium chloride SA (KLOR-CON) 20 MEQ tablet Take 1 tablet (20 mEq total) by mouth daily.   Torsemide 40 MG TABS Take 40 mg by mouth 2 (two) times daily.     Allergies: No Known Allergies  Family History  Problem Relation Age of Onset   Heart failure Mother    Heart disease Mother    Breast cancer Neg Hx    Prostate cancer Neg Hx    Colon cancer Neg Hx    Pancreatic cancer Neg Hx     Social History:  reports that he has never smoked. He has never used smokeless tobacco. He reports current alcohol use. He reports that he does not use drugs.  ROS: A complete review of systems was performed.  All systems are negative except for pertinent findings as noted.  Physical Exam:  Vital signs in last 24 hours: Temp:  [97.8 F (36.6 C)] 97.8 F (36.6 C) (02/23 1342) Pulse Rate:  [80] 80 (02/23 1342) Resp:  [18] 18 (02/23 1342) BP: (118)/(88) 118/88 (02/23 1342) SpO2:  [96 %] 96 % (02/23 1342) Weight:  [100.9 kg] 100.9 kg (02/23 1342) Constitutional:  Alert and oriented, No acute distress Cardiovascular: Regular rate and rhythm, No JVD Respiratory: Normal respiratory effort, Lungs clear bilaterally GI: Abdomen is soft, nontender, nondistended, no abdominal masses GU: No CVA tenderness Lymphatic: No lymphadenopathy Neurologic: Grossly intact, no focal deficits Psychiatric: Normal mood and affect   Laboratory Data:  No results for input(s): WBC, HGB, HCT, PLT in the last 72 hours.  No results  for input(s): NA, K, CL, GLUCOSE, BUN, CALCIUM, CREATININE in the last 72 hours.  Invalid input(s): CO3   No results found for this or any previous visit (from the past 24 hour(s)). No results found for this or any previous visit (from the past 240 hour(s)).  Renal Function: No results for input(s): CREATININE in the last 168 hours. CrCl cannot be calculated (Patient's most recent lab result is older than the maximum 21 days allowed.).  Radiologic Imaging: No results found.  Assessment:  81  yo M with a 5 mm L distal ureteral stone   Plan:  To OR for ESWL as planned. We reviewed risks of the procedure.   Donald Pore, MD 01/25/2022,  3:20 PM  [facility] Pager: [phone]

## 2022-01-25 NOTE — Progress Notes (Signed)
Red blotchy area left lower abd from lithotripsy.

## 2022-01-25 NOTE — Discharge Instructions (Signed)
1. You should strain your urine and collect all fragments and bring them to your follow up appointment.  °2. You should take your pain medication as needed.  Please call if your pain is severe to the point that it is not controlled with your pain medication. °3. You should call if you develop fever > 101 or persistent nausea or vomiting. °4. Your doctor may prescribe tamsulosin to take to help facilitate stone passage. °

## 2022-01-25 NOTE — Op Note (Signed)
See [employer] operative note scanned into chart. Also because of the size, density, location and other factors that cannot be anticipated I feel this will likely be a staged procedure. This fact supersedes any indication in the scanned [place] stone operative note to the contrary.  Donald Pore MD 01/25/2022, 4:38 PM  [facility]  Pager: [phone]

## 2022-01-26 ENCOUNTER — Encounter (HOSPITAL_BASED_OUTPATIENT_CLINIC_OR_DEPARTMENT_OTHER): Payer: Self-pay | Admitting: Urology

## 2022-02-08 DIAGNOSIS — N3020 Other chronic cystitis without hematuria: Secondary | ICD-10-CM | POA: Diagnosis not present

## 2022-02-08 DIAGNOSIS — N201 Calculus of ureter: Secondary | ICD-10-CM | POA: Diagnosis not present

## 2022-02-23 ENCOUNTER — Other Ambulatory Visit: Payer: Self-pay

## 2022-02-23 DIAGNOSIS — I5032 Chronic diastolic (congestive) heart failure: Secondary | ICD-10-CM

## 2022-02-23 MED ORDER — TORSEMIDE 40 MG PO TABS: 40.0000 mg | ORAL_TABLET | Freq: Two times a day (BID) | ORAL | 3 refills | Status: DC

## 2022-02-28 DIAGNOSIS — N201 Calculus of ureter: Secondary | ICD-10-CM | POA: Diagnosis not present

## 2022-03-07 DIAGNOSIS — N201 Calculus of ureter: Secondary | ICD-10-CM | POA: Diagnosis not present

## 2022-03-07 DIAGNOSIS — N132 Hydronephrosis with renal and ureteral calculous obstruction: Secondary | ICD-10-CM | POA: Diagnosis not present

## 2022-03-07 DIAGNOSIS — N3020 Other chronic cystitis without hematuria: Secondary | ICD-10-CM | POA: Diagnosis not present

## 2022-03-08 DIAGNOSIS — I700 Atherosclerosis of aorta: Secondary | ICD-10-CM | POA: Diagnosis not present

## 2022-03-08 DIAGNOSIS — R7303 Prediabetes: Secondary | ICD-10-CM | POA: Diagnosis not present

## 2022-03-08 DIAGNOSIS — N200 Calculus of kidney: Secondary | ICD-10-CM | POA: Diagnosis not present

## 2022-03-08 DIAGNOSIS — C61 Malignant neoplasm of prostate: Secondary | ICD-10-CM | POA: Diagnosis not present

## 2022-03-08 DIAGNOSIS — I4892 Unspecified atrial flutter: Secondary | ICD-10-CM | POA: Diagnosis not present

## 2022-03-08 DIAGNOSIS — N1831 Chronic kidney disease, stage 3a: Secondary | ICD-10-CM | POA: Diagnosis not present

## 2022-03-08 DIAGNOSIS — Z0000 Encounter for general adult medical examination without abnormal findings: Secondary | ICD-10-CM | POA: Diagnosis not present

## 2022-03-08 DIAGNOSIS — Z1389 Encounter for screening for other disorder: Secondary | ICD-10-CM | POA: Diagnosis not present

## 2022-03-08 DIAGNOSIS — K2270 Barrett's esophagus without dysplasia: Secondary | ICD-10-CM | POA: Diagnosis not present

## 2022-03-08 DIAGNOSIS — I10 Essential (primary) hypertension: Secondary | ICD-10-CM | POA: Diagnosis not present

## 2022-03-08 DIAGNOSIS — Z23 Encounter for immunization: Secondary | ICD-10-CM | POA: Diagnosis not present

## 2022-03-08 DIAGNOSIS — E7800 Pure hypercholesterolemia, unspecified: Secondary | ICD-10-CM | POA: Diagnosis not present

## 2022-03-13 ENCOUNTER — Other Ambulatory Visit: Payer: Self-pay | Admitting: Urology

## 2022-03-21 NOTE — Progress Notes (Addendum)
COVID Vaccine Completed: yes x3 ?Date COVID Vaccine completed: 02/12/20, 03/07/20 ?Has received booster: 10/15/20 ?COVID vaccine manufacturer: Pfizer     ? ?Date of COVID positive in last 90 days: no ? ?PCP - Wenda Low, MD ?Cardiologist - Patwardhan ? ?Chest x-ray - 07/10/21 Epic  ?EKG - 12/29/21 Epic ?Stress Test - many years ago per pt ?ECHO - 07/11/21 Epic ?Cardiac Cath - n/a ?Pacemaker/ICD device last checked:n/a ?Spinal Cord Stimulator: n/a ? ?Bowel Prep - no ? ?Sleep Study - n/a ?CPAP -  ? ?Fasting Blood Sugar - n/a ?Checks Blood Sugar _____ times a day ? ?Blood Thinner Instructions: ?Aspirin Instructions: ASA 325, patient reports not taking ?Last Dose: ? ?Activity level: Can go up a flight of stairs and perform activities of daily living without stopping and without symptoms of chest pain or shortness of breath. ?   ?Anesthesia review: HTN, a flutter, CHF, 1st degree AV block, CKD, SIRS, creatinine 1.96 ? ?Patient denies shortness of breath, fever, cough and chest pain at PAT appointment ? ? ?Patient verbalized understanding of instructions that were given to them at the PAT appointment. Patient was also instructed that they will need to review over the PAT instructions again at home before surgery.  ?

## 2022-03-21 NOTE — Patient Instructions (Addendum)
DUE TO COVID-19 ONLY ONE VISITOR  (aged 80 and older)  IS ALLOWED TO COME WITH YOU AND STAY IN THE WAITING ROOM ONLY DURING PRE OP AND PROCEDURE.   ?**NO VISITORS ARE ALLOWED IN THE SHORT STAY AREA OR RECOVERY ROOM!!** ? ? Your procedure is scheduled on: 03/30/22 ? ? Report to [facility] Main Entrance ? ?  Report to admitting at 7:15 AM ? ? Call this number if you have problems the morning of surgery [phone] ? ? Do not eat food :After Midnight. ? ? After Midnight you may have the following liquids until 6:30 AM DAY OF SURGERY ? ?Water ?Black Coffee (sugar ok, NO MILK/CREAM OR CREAMERS)  ?Tea (sugar ok, NO MILK/CREAM OR CREAMERS) regular and decaf                             ?Plain Jell-O (NO RED)                                           ?Fruit ices (not with fruit pulp, NO RED)                                     ?Popsicles (NO RED)                                                                  ?Juice: apple, WHITE grape, WHITE cranberry ?Sports drinks like Gatorade (NO RED) ?Clear broth(vegetable,chicken,beef) ? ?FOLLOW BOWEL PREP AND ANY ADDITIONAL PRE OP INSTRUCTIONS YOU RECEIVED FROM YOUR SURGEON'S OFFICE!!! ?  ?  ?Oral Hygiene is also important to reduce your risk of infection.                                    ?Remember - BRUSH YOUR TEETH THE MORNING OF SURGERY WITH YOUR REGULAR TOOTHPASTE ? ? Take these medicines the morning of surgery with A SIP OF WATER: Allopurinol, Atorvastatin, Nexium, Metoprolol.  ?                  ?           You may not have any metal on your body including jewelry, and body piercing ? ?           Do not wear lotions, powders, cologne, or deodorant ? ?            Men may shave face and neck. ? ? Do not bring valuables to the hospital. [place] NOT ?            RESPONSIBLE   FOR VALUABLES. ?  ? Patients discharged on the day of surgery will not be allowed to drive home.  Someone NEEDS to stay with you for the first 24 hours after anesthesia. ? ? Special  Instructions: Bring a copy of your healthcare power of attorney and living will documents         the day of surgery  if you haven't scanned them before. ? ?            Please read over the following fact sheets you were given: IF [place] [phone]- Apolonio Schneiders ? ?   [place] - Preparing for Surgery ?Before surgery, you can play an important role.  Because skin is not sterile, your skin needs to be as free of germs as possible.  You can reduce the number of germs on your skin by washing with CHG (chlorahexidine gluconate) soap before surgery.  CHG is an antiseptic cleaner which kills germs and bonds with the skin to continue killing germs even after washing. ?Please DO NOT use if you have an allergy to CHG or antibacterial soaps.  If your skin becomes reddened/irritated stop using the CHG and inform your nurse when you arrive at Short Stay. ?Do not shave (including legs and underarms) for at least 48 hours prior to the first CHG shower.  You may shave your face/neck. ? ?Please follow these instructions carefully: ? 1.  Shower with CHG Soap the night before surgery and the  morning of surgery. ? 2.  If you choose to wash your hair, wash your hair first as usual with your normal  shampoo. ? 3.  After you shampoo, rinse your hair and body thoroughly to remove the shampoo.                            ? 4.  Use CHG as you would any other liquid soap.  You can apply chg directly to the skin and wash.  Gently with a scrungie or clean washcloth. ? 5.  Apply the CHG Soap to your body ONLY FROM THE NECK DOWN.   Do   not use on face/ open      ?                     Wound or open sores. Avoid contact with eyes, ears mouth and   genitals (private parts).  ?                     Production manager,  Genitals (private parts) with your normal soap. ?            6.  Wash thoroughly, paying special attention to the area where your    surgery  will be performed. ? 7.  Thoroughly rinse your  body with warm water from the neck down. ? 8.  DO NOT shower/wash with your normal soap after using and rinsing off the CHG Soap. ?               9.  Pat yourself dry with a clean towel. ?           10.  Wear clean pajamas. ?           11.  Place clean sheets on your bed the night of your first shower and do not  sleep with pets. ?Day of Surgery : ?Do not apply any lotions/deodorants the morning of surgery.  Please wear clean clothes to the hospital/surgery center. ? ?FAILURE TO FOLLOW THESE INSTRUCTIONS MAY RESULT IN THE CANCELLATION OF YOUR SURGERY ? ?PATIENT SIGNATURE_________________________________ ? ?NURSE SIGNATURE__________________________________ ? ?________________________________________________________________________  ?

## 2022-03-23 ENCOUNTER — Encounter (HOSPITAL_COMMUNITY)
Admission: RE | Admit: 2022-03-23 | Discharge: 2022-03-23 | Disposition: A | Discharge status: Home / Self Care | Payer: Medicare PPO | Source: Ambulatory Visit | Attending: Urology | Admitting: Urology

## 2022-03-23 ENCOUNTER — Encounter (HOSPITAL_COMMUNITY): Payer: Self-pay

## 2022-03-23 VITALS — BP 112/64 | HR 82 | Temp 97.6°F | Resp 16 | Ht 72.0 in | Wt 220.0 lb

## 2022-03-23 DIAGNOSIS — Z01812 Encounter for preprocedural laboratory examination: Secondary | ICD-10-CM | POA: Diagnosis not present

## 2022-03-23 DIAGNOSIS — I10 Essential (primary) hypertension: Secondary | ICD-10-CM | POA: Diagnosis not present

## 2022-03-23 HISTORY — DX: Unspecified osteoarthritis, unspecified site: M19.90

## 2022-03-23 HISTORY — DX: Cardiac arrhythmia, unspecified: I49.9

## 2022-03-23 LAB — CBC
HCT: 41 % (ref 39.0–52.0)
Hemoglobin: 13.3 g/dL (ref 13.0–17.0)
MCH: 31.1 pg (ref 26.0–34.0)
MCHC: 32.4 g/dL (ref 30.0–36.0)
MCV: 96 fL (ref 80.0–100.0)
Platelets: 327 10*3/uL (ref 150–400)
RBC: 4.27 MIL/uL (ref 4.22–5.81)
RDW: 14.9 % (ref 11.5–15.5)
WBC: 9 10*3/uL (ref 4.0–10.5)
nRBC: 0 % (ref 0.0–0.2)

## 2022-03-23 LAB — BASIC METABOLIC PANEL
Anion gap: 8 (ref 5–15)
BUN: 45 mg/dL — ABNORMAL HIGH (ref 8–23)
CO2: 22 mmol/L (ref 22–32)
Calcium: 9 mg/dL (ref 8.9–10.3)
Chloride: 110 mmol/L (ref 98–111)
Creatinine, Ser: 1.96 mg/dL — ABNORMAL HIGH (ref 0.61–1.24)
GFR, Estimated: 34 mL/min — ABNORMAL LOW (ref >60)
Glucose, Bld: 107 mg/dL — ABNORMAL HIGH (ref 70–99)
Potassium: 3.7 mmol/L (ref 3.5–5.1)
Sodium: 140 mmol/L (ref 135–145)

## 2022-03-23 NOTE — Progress Notes (Signed)
Creatinine 1.96. Results routed to Dr. Jeffie Pollock. ?

## 2022-03-29 ENCOUNTER — Ambulatory Visit: Payer: Medicare PPO | Admitting: Cardiology

## 2022-03-29 ENCOUNTER — Encounter: Payer: Self-pay | Admitting: Cardiology

## 2022-03-29 VITALS — BP 90/73 | HR 80 | Temp 98.0°F | Resp 17 | Ht 72.0 in | Wt 223.0 lb

## 2022-03-29 DIAGNOSIS — I5032 Chronic diastolic (congestive) heart failure: Secondary | ICD-10-CM | POA: Diagnosis not present

## 2022-03-29 DIAGNOSIS — I483 Typical atrial flutter: Secondary | ICD-10-CM

## 2022-03-29 NOTE — Progress Notes (Signed)
? ?Follow up visit ? ?Subjective:  ? ?Matthew [name]., male    DOB: 1941/11/26, 81 y.o.   MRN: [id number] ? ? ? ?HPI ? ?Chief Complaint  ?Patient presents with  ? Atrial Flutter  ? Follow-up  ?  3 month  ? ? ?81 y.o. Caucasian male  with hypertension, h/o prostate cancer s/p prostatectomy and radiation, h/o Barrett's esophagus, new diagnosis of atrial flutter and HFpEF during hospital admission with SIRS/allergic reaction (07/2021) ? ?Patient underwent successful cardioversion on 08/08/2021 and has maintained normal sinus rhythm since. ? ?Patient presents for 35-monthfollow-up.  He is currently scheduled tomorrow for cystoscopy and ureter stent placement given kidney stone.  Patient's creatinine has continued to trend up.  Patient's primary concern today is intermittent episodes of dizziness/lightheadedness since last office visit.  Patient's leg swelling is relatively stable compared to previous office visit.  ? ?Doubly patient has been recommended to undergo sleep evaluation, however he has not done so yet. ? ?Current Outpatient Medications on File Prior to Visit  ?Medication Sig Dispense Refill  ? allopurinol (ZYLOPRIM) 300 MG tablet Take 300 mg by mouth daily.     ? aspirin EC 325 MG tablet Take 325 mg by mouth daily as needed for moderate pain.    ? atorvastatin (LIPITOR) 20 MG tablet Take 20 mg by mouth daily.    ? esomeprazole (NEXIUM) 20 MG capsule Take 20 mg by mouth daily.     ? metoprolol succinate (TOPROL-XL) 100 MG 24 hr tablet Take 100 mg by mouth daily. Take with or immediately following a meal.    ? Multiple Vitamin (MULTIVITAMIN WITH MINERALS) TABS tablet Take 1 tablet by mouth daily. Men +    ? potassium chloride SA (KLOR-CON) 20 MEQ tablet Take 1 tablet (20 mEq total) by mouth daily. 90 tablet 3  ? torsemide (DEMADEX) 20 MG tablet Take 20-40 mg by mouth See admin instructions. Take 40 mg in the morning and 20 mg at night    ? Torsemide 40 MG TABS Take 40 mg by mouth 2 (two) times daily. 180 tablet 3   ? ?No current facility-administered medications on file prior to visit.  ? ? ?Cardiovascular & other pertient studies: ? ?EKG 12/29/2021: ?Probable sinus rhythm 82 bpm   ?First degree A-V block PRi 298 msec ?Early R wave transition ?No change compared to previous EKG ? ?Cardioversion 08/08/2021: ?100JX1 ?Successful ? ?Echocardiogram 07/11/2021: ? 1. Left ventricular ejection fraction, by estimation, is 55 to 60%. The  ?left ventricle has normal function. The left ventricle has no regional  ?wall motion abnormalities. There is mild left ventricular hypertrophy.  ?Left ventricular diastolic parameters are indeterminate.  ? 2. Right ventricular systolic function is normal. The right ventricular  ?size is normal. There is normal pulmonary artery systolic pressure.  ? 3. The mitral valve is normal in structure. No evidence of mitral valve  ?regurgitation. No evidence of mitral stenosis.  ? 4. The aortic valve is normal in structure. Aortic valve regurgitation is  ?not visualized. No aortic stenosis is present.  ? 5. Aortic dilatation noted. There is mild dilatation of the ascending  ?aorta, measuring 42 mm.  ? 6. The inferior vena cava is dilated in size with <50% respiratory  ?variability, suggesting right atrial pressure of 15 mmHg.  ? ?EKG 07/18/2021: ?Atrial flutter with 4:1 AV block ?Ventricular rate 68 bpm  ? ? ?Recent labs: ?03/23/2022: ?Glucose 140, BUN/CR 45/1.96, EGFR 34, NA/K140/3.7 ?H/H 13.3/41.0, MCV 96, platelet 327 ? ?10/30/2021: ?  Glucose 111, BUN/Cr 32/1.83. EGFR 37. Na/K 141/3.7.  ?BNP 125 ? ?07/12/2021: ?Glucose 102, BUN/Cr 18/1.26. EGFR 58. Na/K 143/3.7.  ?BNP 208 ?H/H 12/38. MCV 96. Platelets 237 ?TSH 2.3 normal ?HbA1C N/A ?Lipid panel N/A ? ? ? ?Review of Systems  ?Constitutional: Negative for fever.  ?Cardiovascular:  Positive for leg swelling (stable). Negative for chest pain, dyspnea on exertion, palpitations and syncope.  ?Respiratory:  Positive for snoring.   ?Neurological:  Positive for  light-headedness (intermittent).  ? ?   ? ? ?Vitals:  ? 03/29/22 1305  ?BP: 90/73  ?Pulse: 80  ?Resp: 17  ?Temp: 98 ?F (36.7 ?C)  ?SpO2: 97%  ? ? ?Body mass index is 30.24 kg/m?. Danley Danker Weights  ? 03/29/22 1305  ?Weight: 223 lb (101.2 kg)  ? ? ? ?Objective:  ? Physical Exam ?Vitals and nursing note reviewed.  ?Constitutional:   ?   General: He is not in acute distress. ?Neck:  ?   Vascular: No JVD.  ?Cardiovascular:  ?   Rate and Rhythm: Normal rate and regular rhythm.  ?   Heart sounds: Normal heart sounds. No murmur heard. ?Pulmonary:  ?   Effort: Pulmonary effort is normal.  ?   Breath sounds: Normal breath sounds. No wheezing or rales.  ?Musculoskeletal:  ?   Right lower leg: Edema (1-2 +) present.  ?   Left lower leg: Edema (1-2 +) present.  ? ? ? ?   ?Assessment & Recommendations:  ? ?81 y.o. Caucasian male  with hypertension, h/o prostate cancer s/p prostatectomy and radiation, h/o Barrett's esophagus, chronic leg edema, now admitted with possible SIRS/allergic reaction.  Cardiology consulted for management of atrial flutter. ?  ?Atrial flutter: ?CHA2DS2VASc score at least 3, but with no recurrence of atrial flutter-thus not on anticoagulation ?Continue metoprolol succinate 100 mg daily.  ?Reiterated the importance of sleep study for evaluation of OSA.   ? ?HFpEF: ?Patient with persistent stable leg edema ?Given patient's soft blood pressure today and symptoms of lightheadedness have advised that he hold torsemide until following his systolic syncope and ureter stent placement tomorrow.  He may resume torsemide following the procedure. ?Suspect uptrending creatinine is related to patient's kidney stone ? ?Follow-up in 2 months ? ?Patient was seen in collaboration with Dr. Virgina Jock. He also reviewed patient's chart and examined the patient. Dr. Virgina Jock is in agreement of the plan.  ? ? ?Alethia Berthold, PA-C ?03/29/2022, 1:46 PM ?Office: [phone] ? ?

## 2022-03-29 NOTE — H&P (Signed)
02/08/2022: CT imaging after last office visit showed an obstructing distal left ureteral calculus. After discussion with patient's urologist, shockwave lithotripsy recommended. Procedure performed on 2/23. Patient has a atrophic and likely nonfunctioning right kidney. CT imaging correlating with radiological interpretation suggest that the possibility of a nonobstructing distal right ureteral calculi but on the axial study from CT imaging x2 this may be unlikely benign opacity is somewhat appears to be outside of the ureter. Patient has chronically infected urine and historically we have only treated when symptomatic. He usually has ESBL E. coli on urine cultures. This again was true prior to shockwave lithotripsy and he was treated appropriately with doxycycline.  ? ?KUB today shows continued presence of the left distal ureteral calculi but does appear to have smudged a little bit. Denies bothersome symptoms today. He had no pain or discomfort post procedure. He had 1 episode of gross hematuria but denies any interval stone material passage. Continues to void at his baseline with grossly unchanged and stable symptomology. No interval fevers or chills, nausea/vomiting.  ? ?03/07/2022: Although voiding at his baseline without infection symptoms, absence of pain and discomfort suggestive of obstructive uropathy KUB at last visit showed the continued presence of a distal left ureteral calculi that failed to clear post shockwave lithotripsy performed in late February. Creatinine remains consistent with his baseline at 1.6. There was some elevation/worsening of his renal function prior to the stone diagnosis. 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 coli. I have him on doxycycline once daily for this. He returns today for repeat KUB and renal ultrasound.  ? ?He remains asymptomatic. He did have an isolated incidence of gross hematuria last week but that quickly resolved. He did not see any interval stone material  passage. Patient has completed doxycycline and is now on amoxicillin from his dentist due to an infected tooth. He tells me he is supposed to be having an extraction next week. Again no new or worsening lower urinary tract symptoms, no left-sided pain or discomfort suggestive of obstructive uropathy. He remains afebrile without nausea or vomiting. KUB today continues to show distal left ureteral calculi with continued moderate to severe hydronephrosis noted on ultrasound.  ? ?  ?ALLERGIES: No Allergies ?  ? ?MEDICATIONS: Doxycycline Hyclate 100 mg tablet 1 tablet PO BID Take twice daily for 5 days then once daily after that.  ?Allopurinol 100 MG Oral Tablet 0 Oral  ?Atorvastatin Calcium  ?Metoprolol Tartrate 100 mg tablet Oral  ?Nexium 40 mg capsule,delayed release Oral  ?Torsemide  ?  ? ?GU PSH: Create Passage To Kidney - 2008 ?Cystoscopy Ureteroscopy - 2008, 2008 ?ESWL - 01/25/2022 ?PCNL - 2008 ?Radical Prostatectomy - 2008 ? ?  ?   ?PSH Notes: Upper Gastrointestinal Endoscopy, Simple Primary Exam, Cystoscopy With Ureteroscopy Right, Cystoscopy With Ureteroscopy Left, Prostatect Retropubic Radical W/ Bilat Pelv Lymphadenectomy, Radiation Therapy, Percutaneous Creation Of Nephrostomy Tract, Percutaneous Lithotomy, Prostate Surgery, Prostate Surgery  ? ?NON-GU PSH: Cataract surgery ?Parathyroidectomy ?Sigmoidoscopy And Biopsy - 2013 ? ?  ? ?GU PMH: Chronic cystitis (w/o hematuria) - 02/08/2022, - 01/08/2022, He was treated for an e. coli UTI in August and his UA looks infected again today. I will repeat a culture. , - 09/27/2021, His UA has stable findings. I will get a culture today. , - 03/27/2021, His UA appears infected again. I will get a culture but will not treat unless he becomes symptomatic. , - 2021 ?Ureteral calculus - 02/08/2022 ?Renal calculus - 01/12/2022, - 01/08/2022, He has small  bilateral non-obstructing renal stones. , - 09/27/2021, - 04/04/2021, There is a possible left proximal stone with some mild hydro on  [place] but the hydro has been present in the past. CT to evaluate. , - 03/27/2021, Nephrolithiasis, - 2016, Kidney stone on right side, - 2014 ?Renal cyst - 01/12/2022, - 01/08/2022, He has simple and minimally complex cysts on imaging. , - 09/27/2021 ?Chronic Kidney Disease - 01/08/2022, His last Cr was actually down at 1.32. , - 03/27/2021, Chronic Renal Insufficiency, - 2014 ?Prostate Cancer, He did well with the SBRT but his PSA has not showed much response. I will repeat in 6 months. - 09/27/2021, His PSA remains stable but there is a new bone lesion in the pelvis. I have ordered a CT to assess the renal stone and the pubis should be evaluated with that study. If there does appear to be a metastasis, full staging will be initiated. , - 03/27/2021, His PSA remains in a stable range and he has no worrisome symptoms. F/u in 1 year. , - 2021 (Stable), His PSA remains stable. He will return in a year with a PSA. , - 2019 (Stable), - 2018, Prostate cancer, - 2017 ?Rising PSA after prostate cancer treatment, PSA is 7.57 which is minimally changed. - 09/27/2021, His PSA is actually down slightly , - 03/27/2021, - 2021 ?Secondary bone metastases, He has no bone pain. The lesions were stable on CT on 8/8 but that was only 3 weeks out from RT. - 09/27/2021 ?Stress Incontinence - 09/27/2021, - 03/27/2021, Male stress incontinence, - 2017 ?ED due to arterial insufficiency, Erectile dysfunction due to arterial insufficiency - 2017 ?Primary hypogonadism, Hypogonadism, testicular - 2017 ?Urinary Tract Inf, Unspec site, Urinary tract infection - 2017 ?Disorder Kidney/ureter, Unspec, Renal insufficiency - 2014 ?Testicular pain, unspecified, Testicular Pain - 2014 ?Ureteral stricture, Ureteral Stricture - 2014 ?  ?   ?PMH Notes:  ?2007-11-03 17:17:43 - Note: Nephrolithiasis Of Both Kidneys  ? ?NON-GU PMH: Abnormal findings on diagnostic imaging of other parts of musculoskeletal system, See comments above. - 03/27/2021 ?Encounter for general  adult medical examination without abnormal findings, Encounter for preventive health examination - 2016 ?Barrett's esophagus without dysplasia, Barrett's Esophagus - 2014 ?Congenital single renal cyst, Congenital renal cyst, single - 2014, Congenital renal cyst, single, - 2014 ?Personal history of other diseases of the circulatory system, History of hypertension - 2014 ?  ? ?FAMILY HISTORY: Family Health Status Number - Runs In Family ?renal failure - Mother  ? ?SOCIAL HISTORY: Marital Status: Married ?  ?  Notes: Never A Smoker, Occupation:, Alcohol Use, Marital History - Currently Married, Caffeine Use  ? ?REVIEW OF SYSTEMS:    ?GU Review Male:   Patient reports get up at night to urinate and leakage of urine. Patient denies hard to postpone urination, burning/ pain with urination, frequent urination, penile pain, erection problems, trouble starting your stream, have to strain to urinate , and stream starts and stops.  ?Gastrointestinal (Upper):   Patient denies nausea, vomiting, and indigestion/ heartburn.  ?Gastrointestinal (Lower):   Patient denies diarrhea and constipation.  ?Constitutional:   Patient denies fever, night sweats, weight loss, and fatigue.  ?Skin:   Patient denies skin rash/ lesion and itching.  ?Eyes:   Patient denies blurred vision and double vision.  ?Ears/ Nose/ Throat:   Patient denies sore throat and sinus problems.  ?Hematologic/Lymphatic:   Patient denies swollen glands and easy bruising.  ?Cardiovascular:   Patient denies leg swelling and chest pains.  ?Respiratory:  Patient denies cough and shortness of breath.  ?Endocrine:   Patient denies excessive thirst.  ?Musculoskeletal:   Patient denies back pain and joint pain.  ?Neurological:   Patient denies headaches and dizziness.  ?Psychologic:   Patient denies depression and anxiety.  ? ?Notes: Reviewed previous review of systems 02/08/2022. No changes.  ? ?VITAL SIGNS:    ?  03/07/2022 11:39 AM  ?Weight 223 lb / 101.15 kg  ?Height 72 in  / 182.88 cm  ?BP 104/71 mmHg  ?Pulse 76 /min  ?Temperature 97.8 F / 36.5 C  ?BMI 30.2 kg/m?  ? ?MULTI-SYSTEM PHYSICAL EXAMINATION:    ?Constitutional: Well-nourished. No physical deformities. Normally d

## 2022-03-30 ENCOUNTER — Ambulatory Visit (HOSPITAL_COMMUNITY): Payer: Medicare PPO

## 2022-03-30 ENCOUNTER — Encounter (HOSPITAL_COMMUNITY): Payer: Self-pay | Admitting: Urology

## 2022-03-30 ENCOUNTER — Ambulatory Visit (HOSPITAL_COMMUNITY): Payer: Medicare PPO | Admitting: Physician Assistant

## 2022-03-30 ENCOUNTER — Ambulatory Visit (HOSPITAL_COMMUNITY)
Admission: RE | Admit: 2022-03-30 | Discharge: 2022-03-30 | Disposition: A | Discharge status: Home / Self Care | Payer: Medicare PPO | Attending: Urology | Admitting: Urology

## 2022-03-30 ENCOUNTER — Ambulatory Visit (HOSPITAL_BASED_OUTPATIENT_CLINIC_OR_DEPARTMENT_OTHER): Payer: Medicare PPO | Admitting: Anesthesiology

## 2022-03-30 ENCOUNTER — Encounter (HOSPITAL_COMMUNITY)
Admission: RE | Disposition: A | Discharge status: Home / Self Care | Payer: Self-pay | Source: Home / Self Care | Attending: Urology

## 2022-03-30 DIAGNOSIS — I129 Hypertensive chronic kidney disease with stage 1 through stage 4 chronic kidney disease, or unspecified chronic kidney disease: Secondary | ICD-10-CM | POA: Insufficient documentation

## 2022-03-30 DIAGNOSIS — N189 Chronic kidney disease, unspecified: Secondary | ICD-10-CM | POA: Insufficient documentation

## 2022-03-30 DIAGNOSIS — K219 Gastro-esophageal reflux disease without esophagitis: Secondary | ICD-10-CM | POA: Diagnosis not present

## 2022-03-30 DIAGNOSIS — N132 Hydronephrosis with renal and ureteral calculous obstruction: Secondary | ICD-10-CM | POA: Insufficient documentation

## 2022-03-30 DIAGNOSIS — N3020 Other chronic cystitis without hematuria: Secondary | ICD-10-CM | POA: Diagnosis not present

## 2022-03-30 DIAGNOSIS — N201 Calculus of ureter: Secondary | ICD-10-CM

## 2022-03-30 DIAGNOSIS — N138 Other obstructive and reflux uropathy: Secondary | ICD-10-CM | POA: Diagnosis not present

## 2022-03-30 DIAGNOSIS — N261 Atrophy of kidney (terminal): Secondary | ICD-10-CM | POA: Diagnosis not present

## 2022-03-30 HISTORY — PX: CYSTOSCOPY WITH RETROGRADE PYELOGRAM, URETEROSCOPY AND STENT PLACEMENT: SHX5789

## 2022-03-30 SURGERY — CYSTOURETEROSCOPY, WITH RETROGRADE PYELOGRAM AND STENT INSERTION
Anesthesia: General | Site: Urethra | Laterality: Left

## 2022-03-30 MED ORDER — ONDANSETRON HCL 4 MG/2ML IJ SOLN
INTRAMUSCULAR | Status: DC | PRN
  Administered 2022-03-30: 4 mg via INTRAVENOUS

## 2022-03-30 MED ORDER — LIDOCAINE HCL (PF) 2 % IJ SOLN
INTRAMUSCULAR | Status: AC
  Filled 2022-03-30: qty 5

## 2022-03-30 MED ORDER — HYDROCODONE-ACETAMINOPHEN 5-325 MG PO TABS: 1.0000 | ORAL_TABLET | Freq: Four times a day (QID) | ORAL | 0 refills | Status: DC | PRN

## 2022-03-30 MED ORDER — FENTANYL CITRATE (PF) 100 MCG/2ML IJ SOLN
INTRAMUSCULAR | Status: DC | PRN
  Administered 2022-03-30: 50 ug via INTRAVENOUS

## 2022-03-30 MED ORDER — AMOXICILLIN-POT CLAVULANATE 500-125 MG PO TABS: 1.0000 | ORAL_TABLET | Freq: Three times a day (TID) | ORAL | 0 refills | Status: DC

## 2022-03-30 MED ORDER — PROPOFOL 10 MG/ML IV BOLUS
INTRAVENOUS | Status: AC
Start: 2022-03-30 — End: ?
  Filled 2022-03-30: qty 20

## 2022-03-30 MED ORDER — OXYCODONE HCL 5 MG PO TABS: 5.0000 mg | ORAL_TABLET | ORAL | Status: DC | PRN

## 2022-03-30 MED ORDER — GENTAMICIN SULFATE 40 MG/ML IJ SOLN
5.0000 mg/kg | INTRAVENOUS | Status: AC
  Administered 2022-03-30: 440 mg via INTRAVENOUS
  Filled 2022-03-30: qty 11

## 2022-03-30 MED ORDER — HYDROCODONE-ACETAMINOPHEN 10-325 MG PO TABS: 0.5000 | ORAL_TABLET | Freq: Four times a day (QID) | ORAL | 0 refills | Status: DC | PRN

## 2022-03-30 MED ORDER — CHLORHEXIDINE GLUCONATE 0.12 % MT SOLN
15.0000 mL | Freq: Once | OROMUCOSAL | Status: AC
  Administered 2022-03-30: 15 mL via OROMUCOSAL

## 2022-03-30 MED ORDER — FENTANYL CITRATE (PF) 100 MCG/2ML IJ SOLN
INTRAMUSCULAR | Status: AC
  Filled 2022-03-30: qty 2

## 2022-03-30 MED ORDER — ACETAMINOPHEN 650 MG RE SUPP
650.0000 mg | RECTAL | Status: DC | PRN
  Filled 2022-03-30: qty 1

## 2022-03-30 MED ORDER — MORPHINE SULFATE (PF) 2 MG/ML IV SOLN: 2.0000 mg | INTRAVENOUS | Status: DC | PRN

## 2022-03-30 MED ORDER — PROPOFOL 10 MG/ML IV BOLUS
INTRAVENOUS | Status: DC | PRN
  Administered 2022-03-30: 140 mg via INTRAVENOUS

## 2022-03-30 MED ORDER — DEXAMETHASONE SODIUM PHOSPHATE 10 MG/ML IJ SOLN
INTRAMUSCULAR | Status: AC
  Filled 2022-03-30: qty 1

## 2022-03-30 MED ORDER — ACETAMINOPHEN 325 MG PO TABS: 650.0000 mg | ORAL_TABLET | ORAL | Status: DC | PRN

## 2022-03-30 MED ORDER — SODIUM CHLORIDE 0.9% FLUSH: 3.0000 mL | Freq: Two times a day (BID) | INTRAVENOUS | Status: DC

## 2022-03-30 MED ORDER — PROPOFOL 10 MG/ML IV BOLUS
INTRAVENOUS | Status: AC
  Filled 2022-03-30: qty 20

## 2022-03-30 MED ORDER — ORAL CARE MOUTH RINSE: 15.0000 mL | Freq: Once | OROMUCOSAL | Status: AC

## 2022-03-30 MED ORDER — SODIUM CHLORIDE 0.9 % IR SOLN
Status: DC | PRN
  Administered 2022-03-30: 3000 mL
  Administered 2022-03-30: 1000 mL

## 2022-03-30 MED ORDER — LACTATED RINGERS IV SOLN: INTRAVENOUS | Status: DC

## 2022-03-30 MED ORDER — DEXAMETHASONE SODIUM PHOSPHATE 10 MG/ML IJ SOLN
INTRAMUSCULAR | Status: DC | PRN
  Administered 2022-03-30: 10 mg via INTRAVENOUS

## 2022-03-30 MED ORDER — ONDANSETRON HCL 4 MG/2ML IJ SOLN: 4.0000 mg | Freq: Once | INTRAMUSCULAR | Status: DC | PRN

## 2022-03-30 MED ORDER — SODIUM CHLORIDE 0.9 % IV SOLN: 250.0000 mL | INTRAVENOUS | Status: DC | PRN

## 2022-03-30 MED ORDER — EPHEDRINE 5 MG/ML INJ
INTRAVENOUS | Status: AC
  Filled 2022-03-30: qty 5

## 2022-03-30 MED ORDER — SODIUM CHLORIDE 0.9% FLUSH: 3.0000 mL | INTRAVENOUS | Status: DC | PRN

## 2022-03-30 MED ORDER — FENTANYL CITRATE PF 50 MCG/ML IJ SOSY: 25.0000 ug | PREFILLED_SYRINGE | INTRAMUSCULAR | Status: DC | PRN

## 2022-03-30 MED ORDER — EPHEDRINE SULFATE-NACL 50-0.9 MG/10ML-% IV SOSY
PREFILLED_SYRINGE | INTRAVENOUS | Status: DC | PRN
  Administered 2022-03-30: 5 mg via INTRAVENOUS

## 2022-03-30 MED ORDER — ACETAMINOPHEN 500 MG PO TABS
1000.0000 mg | ORAL_TABLET | Freq: Once | ORAL | Status: AC
Start: 2022-03-30 — End: 2022-03-30
  Administered 2022-03-30: 1000 mg via ORAL
  Filled 2022-03-30: qty 2

## 2022-03-30 MED ORDER — LIDOCAINE 2% (20 MG/ML) 5 ML SYRINGE
INTRAMUSCULAR | Status: DC | PRN
  Administered 2022-03-30: 80 mg via INTRAVENOUS

## 2022-03-30 MED ORDER — ONDANSETRON HCL 4 MG/2ML IJ SOLN
INTRAMUSCULAR | Status: AC
  Filled 2022-03-30: qty 2

## 2022-03-30 SURGICAL SUPPLY — 29 items
BAG URO CATCHER STRL LF (MISCELLANEOUS) ×2 IMPLANT
BASKET STONE NCOMPASS (UROLOGICAL SUPPLIES) IMPLANT
CATH ROBINSON RED A/P 16FR (CATHETERS) ×1 IMPLANT
CATH URETERAL DUAL LUMEN 10F (MISCELLANEOUS) IMPLANT
CATH URETL OPEN 5X70 (CATHETERS) IMPLANT
CLOTH BEACON ORANGE TIMEOUT ST (SAFETY) ×2 IMPLANT
DRSG TEGADERM 2-3/8X2-3/4 SM (GAUZE/BANDAGES/DRESSINGS) ×1 IMPLANT
EXTRACTOR STONE NITINOL NGAGE (UROLOGICAL SUPPLIES) ×1 IMPLANT
GLOVE BIOGEL PI IND STRL 6.5 (GLOVE) IMPLANT
GLOVE BIOGEL PI INDICATOR 6.5 (GLOVE) ×1
GLOVE SURG SS PI 8.0 STRL IVOR (GLOVE) ×2 IMPLANT
GOWN STRL REUS W/ TWL XL LVL3 (GOWN DISPOSABLE) ×1 IMPLANT
GOWN STRL REUS W/TWL XL LVL3 (GOWN DISPOSABLE) ×4
GUIDEWIRE STR DUAL SENSOR (WIRE) ×2 IMPLANT
IV NS IRRIG 3000ML ARTHROMATIC (IV SOLUTION) ×2 IMPLANT
KIT BALLN UROMAX 15FX4 (MISCELLANEOUS) IMPLANT
KIT BALLN UROMAX 26 75X4 (MISCELLANEOUS) ×2
KIT TURNOVER KIT A (KITS) ×1 IMPLANT
LASER FIB FLEXIVA PULSE ID 365 (Laser) IMPLANT
LASER FIB FLEXIVA PULSE ID 550 (Laser) IMPLANT
LASER FIB FLEXIVA PULSE ID 910 (Laser) IMPLANT
MANIFOLD NEPTUNE II (INSTRUMENTS) ×2 IMPLANT
PACK CYSTO (CUSTOM PROCEDURE TRAY) ×2 IMPLANT
SHEATH NAVIGATOR HD 11/13X36 (SHEATH) IMPLANT
STENT URET 6FRX26 CONTOUR (STENTS) ×1 IMPLANT
TRACTIP FLEXIVA PULS ID 200XHI (Laser) IMPLANT
TRACTIP FLEXIVA PULSE ID 200 (Laser)
TUBING CONNECTING 10 (TUBING) ×2 IMPLANT
TUBING UROLOGY SET (TUBING) ×2 IMPLANT

## 2022-03-30 NOTE — Transfer of Care (Signed)
Immediate Anesthesia Transfer of Care Note ? ?Patient: Sotero Brinkmeyer. ? ?Procedure(s) Performed: CYSTOSCOPY WITH LEFT RETROGRADE PYELOGRAM, URETEROSCOPY AND STENT PLACEMENT (Left: Urethra) ? ?Patient Location: PACU ? ?Anesthesia Type:General ? ?Level of Consciousness: lethargic ? ?Airway & Oxygen Therapy: Patient Spontanous Breathing and Patient connected to face mask oxygen ? ?Post-op Assessment: Report given to RN ? ?Post vital signs: Reviewed and stable ? ?Last Vitals:  ?Vitals Value Taken Time  ?BP 121/72 03/30/22 1028  ?Temp    ?Pulse 58 03/30/22 1028  ?Resp 17 03/30/22 1028  ?SpO2 100 % 03/30/22 1028  ?Vitals shown include unvalidated device data. ? ?Last Pain:  ?Vitals:  ? 03/30/22 0745  ?TempSrc:   ?PainSc: 0-No pain  ?   ? ?  ? ?Complications: No notable events documented. ?

## 2022-03-30 NOTE — Anesthesia Postprocedure Evaluation (Signed)
Anesthesia Post Note ? ?Patient: Matthew [name]. ? ?Procedure(s) Performed: CYSTOSCOPY WITH LEFT RETROGRADE PYELOGRAM, URETEROSCOPY AND STENT PLACEMENT (Left: Urethra) ? ?  ? ?Patient location during evaluation: PACU ?Anesthesia Type: General ?Level of consciousness: awake and alert ?Pain management: pain level controlled ?Vital Signs Assessment: post-procedure vital signs reviewed and stable ?Respiratory status: spontaneous breathing, nonlabored ventilation, respiratory function stable and patient connected to nasal cannula oxygen ?Cardiovascular status: blood pressure returned to baseline and stable ?Postop Assessment: no apparent nausea or vomiting ?Anesthetic complications: no ? ? ?No notable events documented. ? ?Last Vitals:  ?Vitals:  ? 03/30/22 1057 03/30/22 1106  ?BP: 120/79 127/83  ?Pulse: 63 66  ?Resp: 15 20  ?Temp: 36.4 ?C 36.4 ?C  ?SpO2: 97% 100%  ?  ?Last Pain:  ?Vitals:  ? 03/30/22 1106  ?TempSrc: Oral  ?PainSc:   ? ? ?  ?  ?  ?  ?  ?  ? ?[place] Lighter ? ? ? ? ?

## 2022-03-30 NOTE — Anesthesia Preprocedure Evaluation (Signed)
Anesthesia Evaluation  ?Patient identified by MRN, date of birth, ID band ?Patient awake ? ? ? ?Reviewed: ?Allergy & Precautions, NPO status , Patient's Chart, lab work & pertinent test results, reviewed documented beta blocker date and time  ? ?Airway ?Mallampati: II ? ?TM Distance: >3 FB ?Neck ROM: Full ? ? ? Dental ? ?(+) Teeth Intact, Dental Advisory Given, Chipped,  ?  ?Pulmonary ?neg pulmonary ROS,  ?  ?Pulmonary exam normal ?breath sounds clear to auscultation ? ? ? ? ? ? Cardiovascular ?hypertension, Pt. on home beta blockers and Pt. on medications ?Normal cardiovascular exam ?Rhythm:Regular Rate:Normal ? ? ?  ?Neuro/Psych ?negative neurological ROS ?   ? GI/Hepatic ?Neg liver ROS, GERD  Medicated,  ?Endo/Other  ?Obesity ? ? Renal/GU ?Left ureteral stone  ? ?Prostate cancer ? ? ?  ?Musculoskeletal ? ?(+) Arthritis ,  ? Abdominal ?  ?Peds ? Hematology ?negative hematology ROS ?(+)   ?Anesthesia Other Findings ?Day of surgery medications reviewed with the patient. ? Reproductive/Obstetrics ? ?  ? ? ? ? ? ? ? ? ? ? ? ? ? ?  ?  ? ? ? ? ? ? ? ? ?Anesthesia Physical ?Anesthesia Plan ? ?ASA: 2 ? ?Anesthesia Plan: General  ? ?Post-op Pain Management: Tylenol PO (pre-op)*  ? ?Induction: Intravenous ? ?PONV Risk Score and Plan: 3 and Dexamethasone, Ondansetron and Treatment may vary due to age or medical condition ? ?Airway Management Planned: LMA ? ?Additional Equipment:  ? ?Intra-op Plan:  ? ?Post-operative Plan: Extubation in OR ? ?Informed Consent: I have reviewed the patients History and Physical, chart, labs and discussed the procedure including the risks, benefits and alternatives for the proposed anesthesia with the patient or authorized representative who has indicated his/her understanding and acceptance.  ? ? ? ?Dental advisory given ? ?Plan Discussed with: CRNA ? ?Anesthesia Plan Comments:   ? ? ? ? ? ? ?Anesthesia Quick Evaluation ? ?

## 2022-03-30 NOTE — Op Note (Signed)
Procedure: 1.  Cystoscopy with left ureteroscopic stone extraction. ?2.  Application of fluoroscopy. ?3.  Cystoscopy with insertion of left double-J stent. ? ?Preop diagnosis: Left distal ureteral stone with obstruction. ? ?Postop diagnosis: Same. ? ?Surgeon: Dr. Irine Seal. ? ?Anesthesia: General. ? ?Specimen: Stone. ? ?Drain: 6 [place] by 26 cm left contour double-J stent with tether. ? ?EBL: None. ? ?Complications: None. ? ?Indications: Patient is a an 81 year old male with a history of recurrent urolithiasis who has had approximately 5 to 6 mm left distal ureteral stone present for some time with obstruction he has been minimally symptomatic and reluctant to proceed with therapy but has had some progressive CKD and has right renal atrophy so he has agreed to proceed with ureteroscopy. ? ?Procedure: He was taken operating room was given gentamicin.  A general anesthetic was induced.  Was placed in lithotomy position and fitted with PAS hose.  His perineum and genitalia were prepped with Betadine solution and draped in usual sterile fashion. ? ?Cystoscopy was performed using a 21 [place] scope and 30 degree lens.  Examination revealed a normal urethra.  The external sphincter was intact but there was mucosal blanching in this area and of the prostate from his prior radiation therapy and radical prostatectomy.  There was no significant bladder neck contracture but there was some resistance to the scope passing.  Examination of bladder revealed a smooth bladder wall without mucosal lesions.  The right ureteral orifice was unremarkable.  The left ureteral orifice was somewhat mounded up with a very small opening. ? ?A sensor wire was advanced through the ureteral orifice with the aid of a 5 [place] open-ended catheter and was advanced the kidney under fluoroscopic guidance.  The open-ended catheter was removed and a 4 cm 15 French high-pressure balloon was placed over the wire across the ureteral meatus and dilated to  20 atm of pressure under fluoroscopic guidance.  The balloon and cystoscope were then removed leaving the wire in place. ? ?The 6.5 French semirigid ureteroscope was advanced alongside the wire and the stone was visualized in the distal ureter.  The stone was grasped with an engage basket and removed intact.  Review of imaging demonstrated no additional proximal stone so I did not feel renoscopy was indicated. ? ?Ureteroscope was removed with the stone and a 6 [place] by 26 cm contour double-J stent was advanced over the wire to the kidney under fluoroscopic guidance.  The ureteroscope was then used to assess the distal loop as the wire was removed.  Once the stent was in good position ureteroscope was removed with the stent string exiting urethra.  The bladder was then drained with a 16 French red rubber catheter.  The string was secured to the patient's penis.  He was taken down from lithotomy position, his anesthetic was reversed and he was moved recovery in stable condition.  There were no complications. ? ? ?

## 2022-03-30 NOTE — Interval H&P Note (Signed)
History and Physical Interval Note: ? ?03/30/2022 ?9:28 AM ? ?Matthew [name].  has presented today for surgery, with the diagnosis of LEFT URETERAL STONE.  The various methods of treatment have been discussed with the patient and family. After consideration of risks, benefits and other options for treatment, the patient has consented to  Procedure(s) with comments: ?CYSTOSCOPY WITH LEFT RETROGRADE PYELOGRAM, URETEROSCOPY WITH HOLIUM LASER AND STENT PLACEMENT (Left) - 1 HR FOR CASE as a surgical intervention.  The patient's history has been reviewed, patient examined, no change in status, stable for surgery.  I have reviewed the patient's chart and labs.  Questions were answered to the patient's satisfaction.   ? ? ?Irine Seal ? ? ?

## 2022-03-30 NOTE — Discharge Instructions (Signed)
You may remove the stent on Tuesday morning by pulling the string.  If you don't feel you can do that, please let the office know.  ? ?Bring stone to office.  ?

## 2022-03-30 NOTE — Anesthesia Procedure Notes (Signed)
Procedure Name: LMA Insertion ?Date/Time: 03/30/2022 9:39 AM ?Performed by: Milford Cage, CRNA ?Pre-anesthesia Checklist: Patient identified, Emergency Drugs available, Suction available and Patient being monitored ?Patient Re-evaluated:Patient Re-evaluated prior to induction ?Oxygen Delivery Method: Circle system utilized ?Preoxygenation: Pre-oxygenation with 100% oxygen ?Induction Type: IV induction ?Ventilation: Mask ventilation without difficulty ?LMA: LMA inserted and LMA with gastric port inserted ?LMA Size: 4.0 ?Number of attempts: 1 ?Tube secured with: Tape ?Dental Injury: Teeth and Oropharynx as per pre-operative assessment  ? ? ? ? ?

## 2022-03-31 ENCOUNTER — Encounter (HOSPITAL_COMMUNITY): Payer: Self-pay | Admitting: Urology

## 2022-04-06 DIAGNOSIS — N201 Calculus of ureter: Secondary | ICD-10-CM | POA: Diagnosis not present

## 2022-04-17 ENCOUNTER — Other Ambulatory Visit: Payer: Self-pay | Admitting: Cardiology

## 2022-04-17 DIAGNOSIS — I5033 Acute on chronic diastolic (congestive) heart failure: Secondary | ICD-10-CM

## 2022-04-20 ENCOUNTER — Telehealth: Payer: Self-pay

## 2022-04-20 NOTE — Telephone Encounter (Signed)
Pt needs refill on Torsemide sent to [facility] on file

## 2022-04-20 NOTE — Telephone Encounter (Signed)
Called and spoke to patient medication has been refilled

## 2022-05-02 DIAGNOSIS — C61 Malignant neoplasm of prostate: Secondary | ICD-10-CM | POA: Diagnosis not present

## 2022-05-02 DIAGNOSIS — N132 Hydronephrosis with renal and ureteral calculous obstruction: Secondary | ICD-10-CM | POA: Diagnosis not present

## 2022-05-09 DIAGNOSIS — N132 Hydronephrosis with renal and ureteral calculous obstruction: Secondary | ICD-10-CM | POA: Diagnosis not present

## 2022-05-09 DIAGNOSIS — N200 Calculus of kidney: Secondary | ICD-10-CM | POA: Diagnosis not present

## 2022-05-30 ENCOUNTER — Ambulatory Visit: Payer: Medicare PPO | Admitting: Cardiology

## 2022-05-30 ENCOUNTER — Encounter: Payer: Self-pay | Admitting: Cardiology

## 2022-05-30 VITALS — BP 120/84 | HR 71 | Temp 98.4°F | Resp 16 | Ht 72.0 in | Wt 226.4 lb

## 2022-05-30 DIAGNOSIS — I5032 Chronic diastolic (congestive) heart failure: Secondary | ICD-10-CM

## 2022-05-30 DIAGNOSIS — I483 Typical atrial flutter: Secondary | ICD-10-CM | POA: Diagnosis not present

## 2022-05-30 NOTE — Progress Notes (Signed)
Follow up visit  Subjective:   Matthew [name]., male    DOB: 05-Sep-1941, 81 y.o.   MRN: [id number]    HPI  Chief Complaint  Patient presents with   Leg Swelling    81 y.o. Caucasian male  with hypertension, h/o prostate cancer s/p prostatectomy and radiation, h/o Barrett's esophagus, new diagnosis of atrial flutter and HFpEF during hospital admission with SIRS/allergic reaction (07/2021)  Patient recently underwent cystoscopy and stent placement for ureteral stone with obstruction.  He denies any complaints of palpitations, orthopnea, PND.  Leg edema is controlled on torsemide 80 mg in a.m. and 40 mg in p.m. recently, creatinine has been further elevated to 1.96.   Current Outpatient Medications:    allopurinol (ZYLOPRIM) 300 MG tablet, Take 300 mg by mouth daily. , Disp: , Rfl:    aspirin EC 325 MG tablet, Take 325 mg by mouth daily as needed for moderate pain., Disp: , Rfl:    atorvastatin (LIPITOR) 20 MG tablet, Take 20 mg by mouth daily., Disp: , Rfl:    esomeprazole (NEXIUM) 20 MG capsule, Take 20 mg by mouth daily. , Disp: , Rfl:    metoprolol succinate (TOPROL-XL) 100 MG 24 hr tablet, Take 100 mg by mouth daily. Take with or immediately following a meal., Disp: , Rfl:    Multiple Vitamin (MULTIVITAMIN WITH MINERALS) TABS tablet, Take 1 tablet by mouth daily. Men +, Disp: , Rfl:    potassium chloride SA (KLOR-CON) 20 MEQ tablet, Take 1 tablet (20 mEq total) by mouth daily., Disp: 90 tablet, Rfl: 3   Torsemide 40 MG TABS, Take 40 mg by mouth 2 (two) times daily., Disp: 180 tablet, Rfl: 3  Cardiovascular & other pertient studies:  EKG 12/29/2021: Probable sinus rhythm 82 bpm   First degree A-V block PRi 298 msec Early R wave transition No change compared to previous EKG  Cardioversion 08/08/2021: 100JX1 Successful  Echocardiogram 07/11/2021:  1. Left ventricular ejection fraction, by estimation, is 55 to 60%. The  left ventricle has normal function. The left ventricle has  no regional  wall motion abnormalities. There is mild left ventricular hypertrophy.  Left ventricular diastolic parameters are indeterminate.   2. Right ventricular systolic function is normal. The right ventricular  size is normal. There is normal pulmonary artery systolic pressure.   3. The mitral valve is normal in structure. No evidence of mitral valve  regurgitation. No evidence of mitral stenosis.   4. The aortic valve is normal in structure. Aortic valve regurgitation is  not visualized. No aortic stenosis is present.   5. Aortic dilatation noted. There is mild dilatation of the ascending  aorta, measuring 42 mm.   6. The inferior vena cava is dilated in size with <50% respiratory  variability, suggesting right atrial pressure of 15 mmHg.   EKG 07/18/2021: Atrial flutter with 4:1 AV block Ventricular rate 68 bpm    Recent labs: 03/23/2022: Glucose 140, BUN/CR 45/1.96, EGFR 34, NA/K140/3.7 H/H 13.3/41.0, MCV 96, platelet 327  10/30/2021: Glucose 111, BUN/Cr 32/1.83. EGFR 37. Na/K 141/3.7.  BNP 125  07/12/2021: Glucose 102, BUN/Cr 18/1.26. EGFR 58. Na/K 143/3.7.  BNP 208 H/H 12/38. MCV 96. Platelets 237 TSH 2.3 normal HbA1C N/A Lipid panel N/A    Review of Systems  Constitutional: Negative for fever.  Cardiovascular:  Positive for leg swelling (stable). Negative for chest pain, dyspnea on exertion, palpitations and syncope.  Respiratory:  Positive for snoring.   Neurological:  Positive for light-headedness (intermittent).  Vitals:   05/30/22 1118  BP: 120/84  Pulse: 71  Resp: 16  Temp: 98.4 F (36.9 C)  SpO2: 96%    Body mass index is 30.71 kg/m. Filed Weights   05/30/22 1118  Weight: 226 lb 6.4 oz (102.7 kg)     Objective:   Physical Exam Vitals and nursing note reviewed.  Constitutional:      General: He is not in acute distress. Neck:     Vascular: No JVD.  Cardiovascular:     Rate and Rhythm: Normal rate and regular rhythm.      Heart sounds: Normal heart sounds. No murmur heard. Pulmonary:     Effort: Pulmonary effort is normal.     Breath sounds: Normal breath sounds. No wheezing or rales.  Musculoskeletal:     Right lower leg: Edema (1-2 +) present.     Left lower leg: Edema (1-2 +) present.         Assessment & Recommendations:   81 y.o. Caucasian male  with hypertension, h/o prostate cancer s/p prostatectomy and radiation, h/o Barrett's esophagus, chronic leg edema, now admitted with possible SIRS/allergic reaction.  Cardiology consulted for management of atrial flutter.   Atrial flutter: CHA2DS2VASc score at least 3, but with no recurrence of atrial flutter-thus not on anticoagulation Continue metoprolol succinate 100 mg daily.   HFpEF: Patient with persistent stable leg edema Given his worsening renal function, I have reduced his torsemide to 40 mg twice daily.  Encourage follow-up with nephrology.  F/u w/me in 6 months   [employer], PA-C 05/30/2022, 11:37 AM Office: [phone]

## 2022-07-25 ENCOUNTER — Other Ambulatory Visit: Payer: Self-pay | Admitting: Cardiology

## 2022-07-25 DIAGNOSIS — I5032 Chronic diastolic (congestive) heart failure: Secondary | ICD-10-CM

## 2022-08-09 DIAGNOSIS — I4892 Unspecified atrial flutter: Secondary | ICD-10-CM | POA: Diagnosis not present

## 2022-08-09 DIAGNOSIS — I5030 Unspecified diastolic (congestive) heart failure: Secondary | ICD-10-CM | POA: Diagnosis not present

## 2022-08-09 DIAGNOSIS — E7800 Pure hypercholesterolemia, unspecified: Secondary | ICD-10-CM | POA: Diagnosis not present

## 2022-08-09 DIAGNOSIS — R7303 Prediabetes: Secondary | ICD-10-CM | POA: Diagnosis not present

## 2022-08-09 DIAGNOSIS — I10 Essential (primary) hypertension: Secondary | ICD-10-CM | POA: Diagnosis not present

## 2022-08-09 DIAGNOSIS — K2270 Barrett's esophagus without dysplasia: Secondary | ICD-10-CM | POA: Diagnosis not present

## 2022-08-09 DIAGNOSIS — N2581 Secondary hyperparathyroidism of renal origin: Secondary | ICD-10-CM | POA: Diagnosis not present

## 2022-08-09 DIAGNOSIS — Z23 Encounter for immunization: Secondary | ICD-10-CM | POA: Diagnosis not present

## 2022-08-09 DIAGNOSIS — N1831 Chronic kidney disease, stage 3a: Secondary | ICD-10-CM | POA: Diagnosis not present

## 2022-09-04 ENCOUNTER — Other Ambulatory Visit: Payer: Self-pay | Admitting: Cardiology

## 2022-09-04 DIAGNOSIS — I5033 Acute on chronic diastolic (congestive) heart failure: Secondary | ICD-10-CM

## 2022-09-25 ENCOUNTER — Other Ambulatory Visit: Payer: Self-pay

## 2022-09-25 DIAGNOSIS — I5032 Chronic diastolic (congestive) heart failure: Secondary | ICD-10-CM

## 2022-09-25 MED ORDER — TORSEMIDE 40 MG PO TABS: 40.0000 mg | ORAL_TABLET | Freq: Two times a day (BID) | ORAL | 3 refills | Status: DC

## 2022-09-26 DIAGNOSIS — N130 Hydronephrosis with ureteropelvic junction obstruction: Secondary | ICD-10-CM | POA: Diagnosis not present

## 2022-09-26 DIAGNOSIS — N200 Calculus of kidney: Secondary | ICD-10-CM | POA: Diagnosis not present

## 2022-10-29 DIAGNOSIS — J40 Bronchitis, not specified as acute or chronic: Secondary | ICD-10-CM | POA: Diagnosis not present

## 2022-10-29 DIAGNOSIS — R059 Cough, unspecified: Secondary | ICD-10-CM | POA: Diagnosis not present

## 2022-10-29 DIAGNOSIS — J329 Chronic sinusitis, unspecified: Secondary | ICD-10-CM | POA: Diagnosis not present

## 2022-11-29 ENCOUNTER — Ambulatory Visit: Payer: Medicare PPO | Admitting: Cardiology

## 2022-12-14 DIAGNOSIS — C61 Malignant neoplasm of prostate: Secondary | ICD-10-CM | POA: Diagnosis not present

## 2022-12-19 ENCOUNTER — Ambulatory Visit: Payer: Medicare PPO | Admitting: Cardiology

## 2022-12-19 ENCOUNTER — Encounter: Payer: Self-pay | Admitting: Cardiology

## 2022-12-19 VITALS — BP 128/80 | HR 60 | Resp 16 | Ht 72.0 in | Wt 233.0 lb

## 2022-12-19 DIAGNOSIS — I10 Essential (primary) hypertension: Secondary | ICD-10-CM | POA: Diagnosis not present

## 2022-12-19 DIAGNOSIS — I5032 Chronic diastolic (congestive) heart failure: Secondary | ICD-10-CM

## 2022-12-19 DIAGNOSIS — I483 Typical atrial flutter: Secondary | ICD-10-CM

## 2022-12-19 NOTE — Progress Notes (Signed)
Follow up visit  Subjective:   Matthew [name]., male    DOB: 30-Sep-1941, 82 y.o.   MRN: [id number]    HPI  Chief Complaint  Patient presents with   Atrial Flutter   Congestive Heart Failure   Follow-up    6 month    82 y.o. Caucasian male  with hypertension, h/o prostate cancer s/p prostatectomy and radiation, h/o Barrett's esophagus, new diagnosis of atrial flutter and HFpEF during hospital admission with SIRS/allergic reaction (07/2021)  Patient is doing well, denies chest pain, shortness of breath, palpitations, orthopnea, PND, TIA/syncope. Leg edema is stable. He has not had any recurrent palpitations symptoms, but he did not feel the symptoms even when he had the atrial flutter.    Current Outpatient Medications:    allopurinol (ZYLOPRIM) 300 MG tablet, Take 300 mg by mouth daily. , Disp: , Rfl:    aspirin EC 325 MG tablet, Take 325 mg by mouth daily as needed for moderate pain., Disp: , Rfl:    atorvastatin (LIPITOR) 20 MG tablet, Take 20 mg by mouth daily., Disp: , Rfl:    esomeprazole (NEXIUM) 20 MG capsule, Take 20 mg by mouth daily. , Disp: , Rfl:    metoprolol succinate (TOPROL-XL) 100 MG 24 hr tablet, Take 100 mg by mouth daily. Take with or immediately following a meal., Disp: , Rfl:    Multiple Vitamin (MULTIVITAMIN WITH MINERALS) TABS tablet, Take 1 tablet by mouth daily. Men +, Disp: , Rfl:    potassium chloride SA (KLOR-CON M) 20 MEQ tablet, TAKE 1 TABLET EVERY DAY, Disp: 90 tablet, Rfl: 3   Torsemide 40 MG TABS, Take 40 mg by mouth 2 (two) times daily., Disp: 180 tablet, Rfl: 3  Cardiovascular & other pertient studies:  EKG 12/19/2022: Sinus rhythm 61 bpm with rate variation First degree A-V block  Cardioversion 08/08/2021: 100JX1 Successful  Echocardiogram 07/11/2021:  1. Left ventricular ejection fraction, by estimation, is 55 to 60%. The  left ventricle has normal function. The left ventricle has no regional  wall motion abnormalities. There is mild left  ventricular hypertrophy.  Left ventricular diastolic parameters are indeterminate.   2. Right ventricular systolic function is normal. The right ventricular  size is normal. There is normal pulmonary artery systolic pressure.   3. The mitral valve is normal in structure. No evidence of mitral valve  regurgitation. No evidence of mitral stenosis.   4. The aortic valve is normal in structure. Aortic valve regurgitation is  not visualized. No aortic stenosis is present.   5. Aortic dilatation noted. There is mild dilatation of the ascending  aorta, measuring 42 mm.   6. The inferior vena cava is dilated in size with <50% respiratory  variability, suggesting right atrial pressure of 15 mmHg.   EKG 07/18/2021: Atrial flutter with 4:1 AV block Ventricular rate 68 bpm    Recent labs: 08/10/2022: Glucose 111, BUN/Cr 35/1.9. EGFR 35. Na/K 143/3.8.  Chol 142, TG 108, HDL 41, LDL 81  07/12/2021: Glucose 102, BUN/Cr 18/1.26. EGFR 58. Na/K 143/3.7.  BNP 208 H/H 12/38. MCV 96. Platelets 237 TSH 2.3 normal HbA1C N/A Lipid panel N/A    Review of Systems  Constitutional: Negative for fever.  Cardiovascular:  Positive for leg swelling (stable). Negative for chest pain, dyspnea on exertion, palpitations and syncope.  Respiratory:  Positive for snoring.   Neurological:  Positive for light-headedness (intermittent).         Vitals:   12/19/22 1157  BP: 128/80  Pulse: 60  Resp: 16  SpO2: 98%    Body mass index is 31.6 kg/m. Filed Weights   12/19/22 1157  Weight: 233 lb (105.7 kg)     Objective:   Physical Exam Vitals and nursing note reviewed.  Constitutional:      General: He is not in acute distress. Neck:     Vascular: No JVD.  Cardiovascular:     Rate and Rhythm: Normal rate and regular rhythm.     Heart sounds: Normal heart sounds. No murmur heard. Pulmonary:     Effort: Pulmonary effort is normal.     Breath sounds: Normal breath sounds. No wheezing or rales.   Musculoskeletal:     Right lower leg: Edema (Trace) present.     Left lower leg: Edema (Trace) present.         Assessment & Recommendations:   82 y.o. Caucasian male  with hypertension, h/o prostate cancer s/p prostatectomy and radiation, h/o Barrett's esophagus, chronic leg edema, h/o atrial flutter during SIRS (07/2021)   Atrial flutter: Episode during SIRS in 07/2021. Continue metoprolol succinate 100 mg daily.  CHA2DS2VASc score at least 3, but with no recurrence of atrial flutter-thus not on anticoagulation Discussed regarding monitoring given his lack of symptoms in the setting of atrial flutter. Options would be commercial smart watch/devices monitoring or ILR. Patient prefers [employer]. Understands that this may not be 100% sensitive.  HFpEF: Mild chronic leg edema, otherwise appears euvolemic. Continue torsemide 40 mg bid. Continue f/u w/nephrology given CKD 3b.  Hypertension: Controlled   F/u w/me in 6 months   [employer], PA-C 12/19/2022, 12:05 PM Office: [phone]

## 2022-12-21 DIAGNOSIS — C61 Malignant neoplasm of prostate: Secondary | ICD-10-CM | POA: Diagnosis not present

## 2022-12-21 DIAGNOSIS — C7951 Secondary malignant neoplasm of bone: Secondary | ICD-10-CM | POA: Diagnosis not present

## 2022-12-21 DIAGNOSIS — N390 Urinary tract infection, site not specified: Secondary | ICD-10-CM | POA: Diagnosis not present

## 2022-12-21 DIAGNOSIS — R9721 Rising PSA following treatment for malignant neoplasm of prostate: Secondary | ICD-10-CM | POA: Diagnosis not present

## 2022-12-25 ENCOUNTER — Emergency Department (HOSPITAL_BASED_OUTPATIENT_CLINIC_OR_DEPARTMENT_OTHER): Payer: Medicare PPO

## 2022-12-25 ENCOUNTER — Other Ambulatory Visit: Payer: Self-pay

## 2022-12-25 ENCOUNTER — Other Ambulatory Visit (HOSPITAL_COMMUNITY): Payer: Self-pay | Admitting: Urology

## 2022-12-25 ENCOUNTER — Observation Stay (HOSPITAL_BASED_OUTPATIENT_CLINIC_OR_DEPARTMENT_OTHER)
Admission: EM | Admit: 2022-12-25 | Discharge: 2022-12-27 | Disposition: A | Discharge status: Home / Self Care | Payer: Medicare PPO | Attending: Internal Medicine | Admitting: Internal Medicine

## 2022-12-25 ENCOUNTER — Encounter (HOSPITAL_COMMUNITY): Payer: Self-pay | Admitting: Internal Medicine

## 2022-12-25 DIAGNOSIS — I130 Hypertensive heart and chronic kidney disease with heart failure and stage 1 through stage 4 chronic kidney disease, or unspecified chronic kidney disease: Secondary | ICD-10-CM | POA: Insufficient documentation

## 2022-12-25 DIAGNOSIS — C7951 Secondary malignant neoplasm of bone: Secondary | ICD-10-CM | POA: Insufficient documentation

## 2022-12-25 DIAGNOSIS — I5032 Chronic diastolic (congestive) heart failure: Secondary | ICD-10-CM | POA: Diagnosis present

## 2022-12-25 DIAGNOSIS — N1831 Chronic kidney disease, stage 3a: Secondary | ICD-10-CM | POA: Diagnosis present

## 2022-12-25 DIAGNOSIS — Z79899 Other long term (current) drug therapy: Secondary | ICD-10-CM | POA: Insufficient documentation

## 2022-12-25 DIAGNOSIS — N281 Cyst of kidney, acquired: Secondary | ICD-10-CM | POA: Diagnosis not present

## 2022-12-25 DIAGNOSIS — K683 Retroperitoneal hematoma: Secondary | ICD-10-CM | POA: Diagnosis not present

## 2022-12-25 DIAGNOSIS — I480 Paroxysmal atrial fibrillation: Secondary | ICD-10-CM

## 2022-12-25 DIAGNOSIS — K2100 Gastro-esophageal reflux disease with esophagitis, without bleeding: Secondary | ICD-10-CM | POA: Diagnosis not present

## 2022-12-25 DIAGNOSIS — M4316 Spondylolisthesis, lumbar region: Secondary | ICD-10-CM | POA: Diagnosis not present

## 2022-12-25 DIAGNOSIS — K5730 Diverticulosis of large intestine without perforation or abscess without bleeding: Secondary | ICD-10-CM | POA: Diagnosis not present

## 2022-12-25 DIAGNOSIS — I5042 Chronic combined systolic (congestive) and diastolic (congestive) heart failure: Secondary | ICD-10-CM | POA: Diagnosis not present

## 2022-12-25 DIAGNOSIS — M85851 Other specified disorders of bone density and structure, right thigh: Secondary | ICD-10-CM | POA: Diagnosis not present

## 2022-12-25 DIAGNOSIS — S36892A Contusion of other intra-abdominal organs, initial encounter: Secondary | ICD-10-CM | POA: Diagnosis not present

## 2022-12-25 DIAGNOSIS — R001 Bradycardia, unspecified: Secondary | ICD-10-CM | POA: Diagnosis not present

## 2022-12-25 DIAGNOSIS — R972 Elevated prostate specific antigen [PSA]: Secondary | ICD-10-CM

## 2022-12-25 DIAGNOSIS — I10 Essential (primary) hypertension: Secondary | ICD-10-CM | POA: Diagnosis present

## 2022-12-25 DIAGNOSIS — Z7982 Long term (current) use of aspirin: Secondary | ICD-10-CM | POA: Diagnosis not present

## 2022-12-25 DIAGNOSIS — M545 Low back pain, unspecified: Secondary | ICD-10-CM | POA: Diagnosis not present

## 2022-12-25 DIAGNOSIS — N200 Calculus of kidney: Secondary | ICD-10-CM | POA: Diagnosis not present

## 2022-12-25 DIAGNOSIS — C61 Malignant neoplasm of prostate: Secondary | ICD-10-CM | POA: Diagnosis present

## 2022-12-25 DIAGNOSIS — Z8546 Personal history of malignant neoplasm of prostate: Secondary | ICD-10-CM | POA: Diagnosis not present

## 2022-12-25 DIAGNOSIS — M25551 Pain in right hip: Secondary | ICD-10-CM | POA: Diagnosis not present

## 2022-12-25 DIAGNOSIS — M5136 Other intervertebral disc degeneration, lumbar region: Secondary | ICD-10-CM | POA: Diagnosis not present

## 2022-12-25 LAB — COMPREHENSIVE METABOLIC PANEL
ALT: 15 U/L (ref 0–44)
AST: 18 U/L (ref 15–41)
Albumin: 3.5 g/dL (ref 3.5–5.0)
Alkaline Phosphatase: 68 U/L (ref 38–126)
Anion gap: 7 (ref 5–15)
BUN: 34 mg/dL — ABNORMAL HIGH (ref 8–23)
CO2: 24 mmol/L (ref 22–32)
Calcium: 8.5 mg/dL — ABNORMAL LOW (ref 8.9–10.3)
Chloride: 108 mmol/L (ref 98–111)
Creatinine, Ser: 1.55 mg/dL — ABNORMAL HIGH (ref 0.61–1.24)
GFR, Estimated: 45 mL/min — ABNORMAL LOW (ref >60)
Glucose, Bld: 143 mg/dL — ABNORMAL HIGH (ref 70–99)
Potassium: 4.3 mmol/L (ref 3.5–5.1)
Sodium: 139 mmol/L (ref 135–145)
Total Bilirubin: 0.5 mg/dL (ref 0.3–1.2)
Total Protein: 6.6 g/dL (ref 6.5–8.1)

## 2022-12-25 LAB — CBC WITH DIFFERENTIAL/PLATELET
Abs Immature Granulocytes: 0.09 10*3/uL — ABNORMAL HIGH (ref 0.00–0.07)
Basophils Absolute: 0 10*3/uL (ref 0.0–0.1)
Basophils Relative: 0 %
Eosinophils Absolute: 0.1 10*3/uL (ref 0.0–0.5)
Eosinophils Relative: 0 %
HCT: 38.7 % — ABNORMAL LOW (ref 39.0–52.0)
Hemoglobin: 12.5 g/dL — ABNORMAL LOW (ref 13.0–17.0)
Immature Granulocytes: 1 %
Lymphocytes Relative: 6 %
Lymphs Abs: 0.8 10*3/uL (ref 0.7–4.0)
MCH: 31.3 pg (ref 26.0–34.0)
MCHC: 32.3 g/dL (ref 30.0–36.0)
MCV: 96.8 fL (ref 80.0–100.0)
Monocytes Absolute: 0.7 10*3/uL (ref 0.1–1.0)
Monocytes Relative: 5 %
Neutro Abs: 10.8 10*3/uL — ABNORMAL HIGH (ref 1.7–7.7)
Neutrophils Relative %: 88 %
Platelets: 293 10*3/uL (ref 150–400)
RBC: 4 MIL/uL — ABNORMAL LOW (ref 4.22–5.81)
RDW: 14.2 % (ref 11.5–15.5)
WBC: 12.5 10*3/uL — ABNORMAL HIGH (ref 4.0–10.5)
nRBC: 0 % (ref 0.0–0.2)

## 2022-12-25 MED ORDER — FENTANYL CITRATE PF 50 MCG/ML IJ SOSY
50.0000 ug | PREFILLED_SYRINGE | Freq: Once | INTRAMUSCULAR | Status: AC
  Administered 2022-12-25: 50 ug via INTRAVENOUS
  Filled 2022-12-25: qty 1

## 2022-12-25 MED ORDER — LIDOCAINE 5 % EX PTCH
1.0000 | MEDICATED_PATCH | CUTANEOUS | Status: DC
  Administered 2022-12-25 – 2022-12-26 (×2): 1 via TRANSDERMAL
  Filled 2022-12-25 (×3): qty 1

## 2022-12-25 MED ORDER — ACETAMINOPHEN 325 MG PO TABS
650.0000 mg | ORAL_TABLET | Freq: Once | ORAL | Status: AC
Start: 2022-12-25 — End: 2022-12-25
  Administered 2022-12-25: 650 mg via ORAL
  Filled 2022-12-25: qty 2

## 2022-12-25 MED ORDER — DIAZEPAM 5 MG/ML IJ SOLN
2.5000 mg | Freq: Once | INTRAMUSCULAR | Status: AC
  Administered 2022-12-25: 2.5 mg via INTRAVENOUS
  Filled 2022-12-25: qty 2

## 2022-12-25 NOTE — ED Notes (Signed)
Report given to Kerri Perches, RN.

## 2022-12-25 NOTE — ED Notes (Signed)
Pt c/o of lower back pain upon trying to sit up in bed. Pt states hip pain intermittently radiates down his R leg.

## 2022-12-25 NOTE — ED Notes (Signed)
Called [facility] for transport at 9:24.

## 2022-12-25 NOTE — ED Triage Notes (Signed)
Pt here from home via [facility] for R hip pain. Per pt he has had a dull nagging pain x1 week, pt denies falls/injuries, per wife pt has been tripping frequently. Pt states today he went to step over his dog and had a sudden increase in pain, pt has been unable to bear weight since. Pt states he has 10/10 pain, associated w/ nausea. Pt has 20g LAC, ems gave 60mg fentanyl. Per EMS pt also having sinus pauses on his ecg.

## 2022-12-25 NOTE — ED Provider Notes (Signed)
[facility] Provider Note   CSN: [id number] Arrival date & time: 12/25/22  1712     History  Chief Complaint  Patient presents with   Hip Pain    Matthew Careaga. is a 82 y.o. male.  Patient here with right lower back pain and right hip pain for the last few days.  Worse today after he stepped awkwardly stepping over his dog.  He uses a cane to ambulate.  He denies any specific fall.  Did not hit his head or lose consciousness.  Not have an actual fall.  He has been having increased pain when trying to bear weight.  He got fentanyl with EMS and feeling better.  Patient has a history of A-fib but has not had ablation.  He is on metoprolol.  Otherwise denies any chest pain or shortness of breath.  Denies any loss of bowel or bladder.  The history is provided by the patient.       Home Medications Prior to Admission medications   Medication Sig Start Date End Date Taking? Authorizing Provider  allopurinol (ZYLOPRIM) 300 MG tablet Take 300 mg by mouth daily.     [provider]  aspirin EC 325 MG tablet Take 325 mg by mouth daily as needed for moderate pain.    [provider]  atorvastatin (LIPITOR) 20 MG tablet Take 20 mg by mouth daily. 02/02/20   [provider]  esomeprazole (NEXIUM) 20 MG capsule Take 20 mg by mouth daily.     [provider]  metoprolol succinate (TOPROL-XL) 100 MG 24 hr tablet Take 100 mg by mouth daily. Take with or immediately following a meal.    [provider]  Multiple Vitamin (MULTIVITAMIN WITH MINERALS) TABS tablet Take 1 tablet by mouth daily. Men +    [provider]  potassium chloride SA (KLOR-CON M) 20 MEQ tablet TAKE 1 TABLET EVERY DAY 07/25/22   Patwardhan, Manish J, MD  Torsemide 40 MG TABS Take 40 mg by mouth 2 (two) times daily. 09/25/22   Patwardhan, Reynold Bowen, MD      Allergies    Patient has no known allergies.    Review of Systems   Review  of Systems  Physical Exam Updated Vital Signs  ED Triage Vitals  Enc Vitals Group     BP 12/25/22 1730 (!) 155/101     Pulse Rate 12/25/22 1730 65     Resp 12/25/22 1730 13     Temp --      Temp src --      SpO2 12/25/22 1730 96 %     Weight 12/25/22 1731 230 lb (104.3 kg)     Height 12/25/22 1731 6' (1.829 m)     Head Circumference --      Peak Flow --      Pain Score 12/25/22 1731 10     Pain Loc --      Pain Edu? --      Excl. in [place]? --     Physical Exam Vitals and nursing note reviewed.  Constitutional:      General: He is not in acute distress.    Appearance: He is well-developed.  HENT:     Head: Normocephalic and atraumatic.  Eyes:     Extraocular Movements: Extraocular movements intact.     Conjunctiva/sclera: Conjunctivae normal.     Pupils: Pupils are equal, round, and reactive to light.  Cardiovascular:  Rate and Rhythm: Normal rate and regular rhythm.     Pulses: Normal pulses.     Heart sounds: No murmur heard. Pulmonary:     Effort: Pulmonary effort is normal. No respiratory distress.     Breath sounds: Normal breath sounds.  Abdominal:     Palpations: Abdomen is soft.     Tenderness: There is no abdominal tenderness.  Musculoskeletal:        General: Tenderness present. No swelling.     Cervical back: Normal range of motion and neck supple.     Comments: No midline spinal tenderness, tenderness to the paraspinal lumbar muscles on the right, tenderness to the right hip, patient is able to flex and extend at the hip and knee without much difficulty on the right but with discomfort.  Skin:    General: Skin is warm and dry.     Capillary Refill: Capillary refill takes less than 2 seconds.  Neurological:     General: No focal deficit present.     Mental Status: He is alert and oriented to person, place, and time.     Cranial Nerves: No cranial nerve deficit.     Sensory: No sensory deficit.     Motor: No weakness.     Coordination: Coordination  normal.  Psychiatric:        Mood and Affect: Mood normal.     ED Results / Procedures / Treatments   Labs (all labs ordered are listed, but only abnormal results are displayed) Labs Reviewed  CBC WITH DIFFERENTIAL/PLATELET - Abnormal; Notable for the following components:      Result Value   WBC 12.5 (*)    RBC 4.00 (*)    Hemoglobin 12.5 (*)    HCT 38.7 (*)    Neutro Abs 10.8 (*)    Abs Immature Granulocytes 0.09 (*)    All other components within normal limits  COMPREHENSIVE METABOLIC PANEL - Abnormal; Notable for the following components:   Glucose, Bld 143 (*)    BUN 34 (*)    Creatinine, Ser 1.55 (*)    Calcium 8.5 (*)    GFR, Estimated 45 (*)    All other components within normal limits    EKG EKG Interpretation  Date/Time:  Tuesday December 25 2022 17:25:18 EST Ventricular Rate:  64 PR Interval:  220 QRS Duration: 123 QT Interval:  478 QTC Calculation: 360 R Axis:   39 Text Interpretation: Sinus bradycardia Prolonged PR interval Right bundle branch block Confirmed by Lennice Sites ([id number]) on 12/25/2022 5:38:14 PM  Radiology CT Renal Stone Study  Result Date: 12/25/2022 CLINICAL DATA:  Abdominal and flank pain, right-sided. EXAM: CT ABDOMEN AND PELVIS WITHOUT CONTRAST TECHNIQUE: Multidetector CT imaging of the abdomen and pelvis was performed following the standard protocol without IV contrast. RADIATION DOSE REDUCTION: This exam was performed according to the departmental dose-optimization program which includes automated exposure control, adjustment of the mA and/or kV according to patient size and/or use of iterative reconstruction technique. COMPARISON:  CT abdomen and pelvis 01/12/2022 FINDINGS: Lower chest: No acute abnormality. Hepatobiliary: No focal liver abnormality is seen. No gallstones, gallbladder wall thickening, or biliary dilatation. Pancreas: Unremarkable. No pancreatic ductal dilatation or surrounding inflammatory changes. Spleen: Normal in size  without focal abnormality. Adrenals/Urinary Tract: There is no hydronephrosis. There is marked right renal cortical scarring and atrophy, unchanged. There are innumerable cysts and mildly hyperdense lesions in both kidneys similar to the prior study. The largest hyperdense lesion is in the right kidney  measuring 5.7 cm. Additionally there are nonobstructing right renal calculi measuring up to 13 mm. The adrenal glands and bladder are within normal limits. Stomach/Bowel: No evidence of bowel wall thickening, distention, or inflammatory changes. There is sigmoid colon diverticulosis. The appendix is not visualized. There is a large amount of stool throughout the colon. There is a small hiatal hernia. The stomach is otherwise within normal limits. Vascular/Lymphatic: Aortic atherosclerosis. No enlarged abdominal or pelvic lymph nodes. Reproductive: Prostate gland is small or surgically absent. Other: There is intramuscular right retroperitoneal hematoma. The right psoas muscle is enlarged measuring up to 8.0 x 8.0 cm image 2/42. There is some mild fluid and stranding tracking along the right retroperitoneum as well. There are small fat containing inguinal hernias, left greater than right. There is no ascites or focal abdominal wall hernia. Musculoskeletal: Again seen are scattered sclerotic osseous lesions. Similar mildly increased in size. Multilevel degenerative changes affect the spine. IMPRESSION: 1. Right retroperitoneal hematoma. 2. Stable right renal cortical scarring and atrophy. 3. Nonobstructing right renal calculi. 4. Colonic diverticulosis. 5. Osseous metastatic disease, mildly increased. 6. Right Bosniak II benign renal cyst measuring 5.7 cm. No follow-up imaging is recommended. JACR 2018 Feb; 264-273, Management of the Incidental Renal Mass on CT, [employer] 2021; 814-848, Bosniak Classification of Cystic Renal Masses, Version 2019. Aortic Atherosclerosis (ICD10-I70.0). Electronically Signed   By: Ronney Asters M.D.   On: 12/25/2022 19:33   DG Lumbar Spine Complete  Result Date: 12/25/2022 CLINICAL DATA:  Pain.  No reported history of injury. EXAM: LUMBAR SPINE - COMPLETE 4+ VIEW COMPARISON:  CT evaluation of January 12, 2022 FINDINGS: EKG leads project over the abdomen. Large RIGHT renal calculus incidentally noted not substantially changed compared to previous imaging measuring approximately 16 mm in the area of the lower pole the RIGHT kidney. Five lumbar type vertebral bodies. Degenerative changes throughout the lumbar spine greatest at L4-5 and L5-S1 with grade 1 anterolisthesis of L4 on L5 and L5 on S1 similar to imaging from February 2023. Mild-to-moderate degenerative changes elsewhere in the lumbar spine. Subtle retrolisthesis of L1 on L2 also similar to previous imaging. IMPRESSION: Degenerative changes throughout the spine greatest at L4-5 and L5-S1 associated with anterolisthesis at these levels, not changed since previous imaging. Large RIGHT renal calculus.  Correlate with signs of renal colic. Electronically Signed   By: Zetta Bills M.D.   On: 12/25/2022 18:14   DG Hip Unilat With Pelvis 2-3 Views Right  Result Date: 12/25/2022 CLINICAL DATA:  Pain EXAM: DG HIP (WITH OR WITHOUT PELVIS) 3V RIGHT COMPARISON:  None Available. FINDINGS: There is no evidence of hip fracture or dislocation. There is no evidence of arthropathy or other focal bone abnormality. Osteopenia. Surgical clips are seen in the pelvis. There is a sclerotic focus along the right supra-acetabular region. This is increased from prior CT scan August 2022. There is also a sclerotic focus along the right inferior pubic ramus and some subtle areas elsewhere. History of prostate cancer. Overlapping cardiac leads. IMPRESSION: 1. No acute fracture or dislocation. 2. Osteopenia. 3. Sclerotic foci along the right supra-acetabular region and right inferior pubic ramus. These are increased from prior CT scan. History of prostate  cancer. Known sclerotic bone metastases. Please correlate with prior workup. No fracture or dislocation. Electronically Signed   By: Jill Side M.D.   On: 12/25/2022 18:13    Procedures Procedures    Medications Ordered in ED Medications  lidocaine (LIDODERM) 5 % 1 patch (1 patch Transdermal  Patch Applied 12/25/22 1757)  diazepam (VALIUM) injection 2.5 mg (2.5 mg Intravenous Given 12/25/22 1803)  acetaminophen (TYLENOL) tablet 650 mg (650 mg Oral Given 12/25/22 1757)  fentaNYL (SUBLIMAZE) injection 50 mcg (50 mcg Intravenous Given 12/25/22 1928)    ED Course/ Medical Decision Making/ A&P                             Medical Decision Making Amount and/or Complexity of Data Reviewed Labs: ordered. Radiology: ordered.  Risk OTC drugs. Prescription drug management. Decision regarding hospitalization.   Matthew Lauber. is here with right hip pain after stepping awkwardly at home.  History of atrial fibrillation but had an ablation and not on blood thinners.  He does take aspirin.  History of heart failure.  He is tender mostly in the right lower back, right hip.  He has not been able to walk since injuring it this afternoon.  Neurovascular neuromuscular he is intact.  Is able to flex and extend but with discomfort in the right lower leg.  X-rays were obtained that show no obvious fracture or malalignment per my review and interpretation.  However he does have a history of prostate cancer.  There was some increase sclerotic lesions on the pelvis therefore I will obtain a CT scan to further evaluate for injury.  This revealed a retroperitoneal hematoma around his right iliopsoas muscle.  I suspect he pulled a muscle now with some bleeding.  He is hemodynamically stable.  CBC and BMP were obtained and are unremarkable.  Hemoglobin is 12.5.  Talked with Dr. Redmond Pulling with general surgery who recommends that we trend CBC every 8.  Continue to follow his hemodynamics.  If he has a significant drop in  his hemoglobin may need to consider a CT scan with contrast to see if there is something for IR intervention.  This will likely be managed nonoperatively.  Transfuse as needed.  Patient overall pain controlled with multiple doses of IV narcotics.  He is well-appearing.  He understands the plan for admission to medicine for further observation.  Will need to work with PT and OT and have further pain management as well.  Admitted to medicine.  This chart was dictated using voice recognition software.  Despite best efforts to proofread,  errors can occur which can change the documentation meaning.         Final Clinical Impression(s) / ED Diagnoses Final diagnoses:  Retroperitoneal hematoma    Rx / DC Orders ED Discharge Orders     None         Lennice Sites, DO 12/25/22 2109

## 2022-12-25 NOTE — ED Notes (Signed)
Attempted report 

## 2022-12-26 ENCOUNTER — Encounter (HOSPITAL_COMMUNITY): Payer: Self-pay | Admitting: Internal Medicine

## 2022-12-26 ENCOUNTER — Other Ambulatory Visit: Payer: Self-pay | Admitting: Cardiology

## 2022-12-26 ENCOUNTER — Observation Stay (HOSPITAL_BASED_OUTPATIENT_CLINIC_OR_DEPARTMENT_OTHER): Payer: Medicare PPO

## 2022-12-26 DIAGNOSIS — C7951 Secondary malignant neoplasm of bone: Secondary | ICD-10-CM | POA: Diagnosis not present

## 2022-12-26 DIAGNOSIS — N1831 Chronic kidney disease, stage 3a: Secondary | ICD-10-CM | POA: Diagnosis not present

## 2022-12-26 DIAGNOSIS — K2100 Gastro-esophageal reflux disease with esophagitis, without bleeding: Secondary | ICD-10-CM | POA: Diagnosis present

## 2022-12-26 DIAGNOSIS — C61 Malignant neoplasm of prostate: Secondary | ICD-10-CM

## 2022-12-26 DIAGNOSIS — I455 Other specified heart block: Secondary | ICD-10-CM | POA: Diagnosis not present

## 2022-12-26 DIAGNOSIS — K683 Retroperitoneal hematoma: Secondary | ICD-10-CM | POA: Diagnosis not present

## 2022-12-26 DIAGNOSIS — I10 Essential (primary) hypertension: Secondary | ICD-10-CM

## 2022-12-26 DIAGNOSIS — I480 Paroxysmal atrial fibrillation: Secondary | ICD-10-CM | POA: Diagnosis not present

## 2022-12-26 DIAGNOSIS — I4891 Unspecified atrial fibrillation: Secondary | ICD-10-CM

## 2022-12-26 DIAGNOSIS — I5032 Chronic diastolic (congestive) heart failure: Secondary | ICD-10-CM | POA: Diagnosis not present

## 2022-12-26 DIAGNOSIS — I441 Atrioventricular block, second degree: Secondary | ICD-10-CM | POA: Diagnosis not present

## 2022-12-26 LAB — COMPREHENSIVE METABOLIC PANEL
ALT: 15 U/L (ref 0–44)
AST: 18 U/L (ref 15–41)
Albumin: 3.4 g/dL — ABNORMAL LOW (ref 3.5–5.0)
Alkaline Phosphatase: 64 U/L (ref 38–126)
Anion gap: 10 (ref 5–15)
BUN: 30 mg/dL — ABNORMAL HIGH (ref 8–23)
CO2: 25 mmol/L (ref 22–32)
Calcium: 8.5 mg/dL — ABNORMAL LOW (ref 8.9–10.3)
Chloride: 107 mmol/L (ref 98–111)
Creatinine, Ser: 1.53 mg/dL — ABNORMAL HIGH (ref 0.61–1.24)
GFR, Estimated: 46 mL/min — ABNORMAL LOW (ref >60)
Glucose, Bld: 120 mg/dL — ABNORMAL HIGH (ref 70–99)
Potassium: 3.8 mmol/L (ref 3.5–5.1)
Sodium: 142 mmol/L (ref 135–145)
Total Bilirubin: 0.8 mg/dL (ref 0.3–1.2)
Total Protein: 6 g/dL — ABNORMAL LOW (ref 6.5–8.1)

## 2022-12-26 LAB — CBC WITH DIFFERENTIAL/PLATELET
Abs Immature Granulocytes: 0.08 10*3/uL — ABNORMAL HIGH (ref 0.00–0.07)
Basophils Absolute: 0 10*3/uL (ref 0.0–0.1)
Basophils Relative: 0 %
Eosinophils Absolute: 0.2 10*3/uL (ref 0.0–0.5)
Eosinophils Relative: 2 %
HCT: 38.2 % — ABNORMAL LOW (ref 39.0–52.0)
Hemoglobin: 12.3 g/dL — ABNORMAL LOW (ref 13.0–17.0)
Immature Granulocytes: 1 %
Lymphocytes Relative: 16 %
Lymphs Abs: 2.1 10*3/uL (ref 0.7–4.0)
MCH: 32.1 pg (ref 26.0–34.0)
MCHC: 32.2 g/dL (ref 30.0–36.0)
MCV: 99.7 fL (ref 80.0–100.0)
Monocytes Absolute: 1.2 10*3/uL — ABNORMAL HIGH (ref 0.1–1.0)
Monocytes Relative: 10 %
Neutro Abs: 9.2 10*3/uL — ABNORMAL HIGH (ref 1.7–7.7)
Neutrophils Relative %: 71 %
Platelets: 271 10*3/uL (ref 150–400)
RBC: 3.83 MIL/uL — ABNORMAL LOW (ref 4.22–5.81)
RDW: 14.4 % (ref 11.5–15.5)
WBC: 12.8 10*3/uL — ABNORMAL HIGH (ref 4.0–10.5)
nRBC: 0 % (ref 0.0–0.2)

## 2022-12-26 LAB — ECHOCARDIOGRAM COMPLETE
Area-P 1/2: 3.56 cm2
Calc EF: 59 %
Height: 72 in
S' Lateral: 2.7 cm
Single Plane A2C EF: 58.8 %
Single Plane A4C EF: 58.1 %
Weight: 3680 oz

## 2022-12-26 LAB — HEMOGLOBIN AND HEMATOCRIT, BLOOD
HCT: 38.9 % — ABNORMAL LOW (ref 39.0–52.0)
Hemoglobin: 12.4 g/dL — ABNORMAL LOW (ref 13.0–17.0)

## 2022-12-26 LAB — MAGNESIUM: Magnesium: 2.2 mg/dL (ref 1.7–2.4)

## 2022-12-26 MED ORDER — PANTOPRAZOLE SODIUM 40 MG PO TBEC
40.0000 mg | DELAYED_RELEASE_TABLET | Freq: Every day | ORAL | Status: DC
  Administered 2022-12-26 – 2022-12-27 (×2): 40 mg via ORAL
  Filled 2022-12-26 (×2): qty 1

## 2022-12-26 MED ORDER — DOCUSATE SODIUM 100 MG PO CAPS
100.0000 mg | ORAL_CAPSULE | Freq: Two times a day (BID) | ORAL | Status: DC
  Administered 2022-12-26 – 2022-12-27 (×4): 100 mg via ORAL
  Filled 2022-12-26 (×4): qty 1

## 2022-12-26 MED ORDER — HYDROCODONE-ACETAMINOPHEN 10-325 MG PO TABS
1.0000 | ORAL_TABLET | ORAL | Status: DC | PRN
  Administered 2022-12-26: 1 via ORAL
  Filled 2022-12-26: qty 1

## 2022-12-26 MED ORDER — ATORVASTATIN CALCIUM 20 MG PO TABS
20.0000 mg | ORAL_TABLET | Freq: Every day | ORAL | Status: DC
  Administered 2022-12-26 – 2022-12-27 (×2): 20 mg via ORAL
  Filled 2022-12-26 (×2): qty 1

## 2022-12-26 MED ORDER — ACETAMINOPHEN 325 MG PO TABS: 650.0000 mg | ORAL_TABLET | Freq: Four times a day (QID) | ORAL | Status: DC | PRN

## 2022-12-26 MED ORDER — ACETAMINOPHEN 650 MG RE SUPP: 650.0000 mg | Freq: Four times a day (QID) | RECTAL | Status: DC | PRN

## 2022-12-26 MED ORDER — ONDANSETRON HCL 4 MG/2ML IJ SOLN: 4.0000 mg | Freq: Four times a day (QID) | INTRAMUSCULAR | Status: DC | PRN

## 2022-12-26 MED ORDER — HYDROMORPHONE HCL 1 MG/ML IJ SOLN: 1.0000 mg | INTRAMUSCULAR | Status: DC | PRN

## 2022-12-26 MED ORDER — ONDANSETRON HCL 4 MG PO TABS: 4.0000 mg | ORAL_TABLET | Freq: Four times a day (QID) | ORAL | Status: DC | PRN

## 2022-12-26 MED ORDER — TORSEMIDE 20 MG PO TABS
80.0000 mg | ORAL_TABLET | Freq: Every day | ORAL | Status: DC
  Administered 2022-12-26 – 2022-12-27 (×2): 80 mg via ORAL
  Filled 2022-12-26 (×2): qty 4

## 2022-12-26 MED ORDER — TORSEMIDE 20 MG PO TABS
40.0000 mg | ORAL_TABLET | ORAL | Status: AC
  Administered 2022-12-26: 40 mg via ORAL
  Filled 2022-12-26: qty 2

## 2022-12-26 MED ORDER — POTASSIUM CHLORIDE CRYS ER 20 MEQ PO TBCR
20.0000 meq | EXTENDED_RELEASE_TABLET | Freq: Every day | ORAL | Status: DC
  Administered 2022-12-26 – 2022-12-27 (×2): 20 meq via ORAL
  Filled 2022-12-26 (×2): qty 1

## 2022-12-26 MED ORDER — TORSEMIDE 20 MG PO TABS
40.0000 mg | ORAL_TABLET | Freq: Every evening | ORAL | Status: DC
  Filled 2022-12-26 (×2): qty 2

## 2022-12-26 NOTE — Plan of Care (Signed)
  Problem: Pain Managment: Goal: General experience of comfort will improve Outcome: Progressing   Problem: Safety: Goal: Ability to remain free from injury will improve Outcome: Progressing   Problem: Skin Integrity: Goal: Risk for impaired skin integrity will decrease Outcome: Progressing

## 2022-12-26 NOTE — Hospital Course (Addendum)
82 year old male with metastatic prostate cancer to the bone history of a flutter status post cardioversion-not on anticoagulants or aspirin, hypertension chronic systolic CHF CKD stage III with baseline creatinine 1.5-1.9, GERD presented with sudden episode of right hip pain while he was trying to step over his dog, unable to get out of his recliner EMS was called and brought to the ED. In the ED hemodynamically stable noted to have sinus pauses on his telemetry per EMS. CT renal stone was done which showed a retroperitoneal intramuscular hematoma of the right psoas muscle measuring 8 x 8 cm.General surgery was consulted.  [facility] hospitalist contacted for admission. In ED he had several pauses on his telemetry of 4 to 5 seconds.Patient was completely asymptomatic. EKG demonstrates Mobitz type I secondary block.  His telemetry shows atrial fibrillation without rapid ventricular response. Patient just been seen by his cardiologist on 12/19/2022 and he was in sinus rhythm at time.  Cardiology recommended that he not be on anticoagulation since he was in sinus rhythm during that office visit. Pain is improving, H&H overall stable.  Seen by cardiology ordered echocardiogram advised to discontinue metoprolol for sinus pause up to 5.2-second and will be reevaluated with outpatient telemetry.  Echo showed EF 55 to 60%, no RWMA mitral valve is normal aortic valve is tricuspid. Pain is much better able to mobilize well, feels comfortable going home today

## 2022-12-26 NOTE — Assessment & Plan Note (Signed)
Continue with torsemide 80 mg qam, 40 mg qpm. With his cardiac pauses(4-5 secs), will hold his toprol-xl.

## 2022-12-26 NOTE — Assessment & Plan Note (Signed)
Chronic. Pt f/u with urology. States states he has XRT to his bony lesions last year. Has PET scan scheduled by urology due to increasing PSA.

## 2022-12-26 NOTE — Progress Notes (Signed)
Patient seen and examined personally, I reviewed the chart, history and physical and admission note, done by admitting physician this morning and agree with the same with following addendum.  Please refer to the morning admission note for more detailed plan of care.  He reports he was able to get to the bedside commode with some pain on his right side.  No chest pain nausea vomiting fevers or chills.  He feels better today  Briefly,  82 year old male with metastatic prostate cancer to the bone history of a flutter status post cardioversion-not on anticoagulants or aspirin, hypertension chronic systolic CHF CKD stage III with baseline creatinine 1.5-1.9, GERD presented with sudden episode of right hip pain while he was trying to step over his dog, unable to get out of his recliner EMS was called and brought to the ED. In the ED hemodynamically stable noted to have sinus pauses on his telemetry per EMS. CT renal stone was done which showed a retroperitoneal intramuscular hematoma of the right psoas muscle measuring 8 x 8 cm.General surgery was consulted.  [facility] hospital[facility] contacted for admission. In ED he had several pauses on his telemetry of 4 to 5 seconds.Patient was completely asymptomatic. EKG demonstrates Mobitz type I secondary block.  His telemetry shows atrial fibrillation without rapid ventricular response. Patient just been seen by his cardiologist on 12/19/2022 and he was in sinus rhythm at time.  Cardiology recommended that he not be on anticoagulation since he was in sinus rhythm during that office visit   Issues: Spontaneous retroperitoneal hematoma : Hemoglobin remains stable General surgery has been consulted advised to monitor and trend H&H.  Currently not on aspirin antiplatelets or anticoagulants. cont Prn hydrocodone or IV dilaudid for pain.   PAF: Tele w/ sinus pauses of 4 to 5-second: Asymptomatic had a flutter that was cardioverted, in NSR. ?  If he has SSS, echo ordered, will  request cardiology INPUTS- notified Dr Virgina Jock. Holding his Toprol   CKD3a: - Baseline Scr 1.5-1.9 Stable at baseline   Chronic heart failure with preserved EF: On torsemide.  Euvolemic. Essential hypertension: Holding metoprolol   Malignant neoplasm of prostate metastatic to bone:Chronic. Pt f/u with urology. States states he has XRT to his bony lesions last year. Has PET scan scheduled by urology due to increasing PSA.   GERD:Stable

## 2022-12-26 NOTE — Consult Note (Signed)
CARDIOLOGY CONSULT NOTE  Patient ID: Matthew [name]. MRN: [id number] DOB/AGE: 05/31/1941 82 y.o.  Admit date: 12/25/2022 Referring Physician: [facility] hospitalist  Reason for Consultation:  Pauses  HPI:   83 y.o. Caucasian male  with hypertension, h/o prostate cancer s/p prostatectomy and radiation, h/o Barrett's esophagus, chronic leg edema, h/o atrial flutter during SIRS (07/2021), now admitted with spontaneous retroperitoneal hematoma. Cardiology consulted for pauses on telemetry.   I saw the patient in the office on 12/19/2022. He presented to [facility] ER with right hip pain on 12/25/2022. CT scan showed retroperitoneal intramuscular hematoma of the right psoas muscle measuring 8 x 8 cm. Patient is not on any systemic anticoagulation. Does not take aspirin on a regular basis. General surgery is recommending observation at this time. Incidentally, telemetry showed 4-5 sec pauses. Cardiology thus consulted.   Patient denies any presycnope, syncope symptoms. Pain in right hip is improving. He has not slept much. He was snoring when I entered the room .    Past Medical History:  Diagnosis Date   Arthritis    Barrett's esophagus    Cancer ([place]) 2004   prostate    Dysrhythmia    GERD (gastroesophageal reflux disease)    pt denies   History of kidney stones    Hyperlipidemia    Hypertension    Pneumonia    In high school   Prostate cancer [facility])      Past Surgical History:  Procedure Laterality Date   CARDIOVERSION N/A 08/08/2021   Procedure: CARDIOVERSION;  Surgeon: Nigel Mormon, MD;  Location: [facility];  Service: Cardiovascular;  Laterality: N/A;   COLONOSCOPY WITH PROPOFOL N/A 01/09/2016   Procedure: COLONOSCOPY WITH PROPOFOL;  Surgeon: Garlan Fair, MD;  Location: [facility];  Service: Endoscopy;  Laterality: N/A;   CYSTOSCOPY     x 1   CYSTOSCOPY WITH RETROGRADE PYELOGRAM, URETEROSCOPY AND STENT PLACEMENT Left 03/30/2022   Procedure: CYSTOSCOPY WITH LEFT RETROGRADE  PYELOGRAM, URETEROSCOPY AND STENT PLACEMENT;  Surgeon: Irine Seal, MD;  Location: [facility];  Service: Urology;  Laterality: Left;  1 HR FOR CASE   ESOPHAGOGASTRODUODENOSCOPY (EGD) WITH PROPOFOL N/A 01/09/2016   Procedure: ESOPHAGOGASTRODUODENOSCOPY (EGD) WITH PROPOFOL;  Surgeon: Garlan Fair, MD;  Location: [facility];  Service: Endoscopy;  Laterality: N/A;   EXTRACORPOREAL SHOCK WAVE LITHOTRIPSY Left 01/25/2022   Procedure: LEFT EXTRACORPOREAL SHOCK WAVE LITHOTRIPSY (ESWL);  Surgeon: Vira Agar, MD;  Location: [facility];  Service: Urology;  Laterality: Left;   EYE SURGERY     bil cataract   KNEE ARTHROSCOPY Left 25 yrs ago   PARATHYROIDECTOMY N/A 02/25/2020   Procedure: NECK EXPLORATION WITH PARATHYROIDECTOMY;  Surgeon: Armandina Gemma, MD;  Location: [facility];  Service: General;  Laterality: N/A;   PROSTATECTOMY  2004      Family History  Problem Relation Age of Onset   Heart failure Mother    Heart disease Mother    Breast cancer Neg Hx    Prostate cancer Neg Hx    Colon cancer Neg Hx    Pancreatic cancer Neg Hx      Social History: Social History   Socioeconomic History   Marital status: Married    Spouse name: Not on file   Number of children: 5   Years of education: Not on file   Highest education level: Not on file  Occupational History   Not on file  Tobacco Use   Smoking status: Never   Smokeless tobacco: Never  Vaping  Use   Vaping Use: Never used  Substance and Sexual Activity   Alcohol use: Yes    Alcohol/week: 4.0 standard drinks of alcohol    Types: 4 Cans of beer per week    Comment: occasional beer   Drug use: No   Sexual activity: Not Currently  Other Topics Concern   Not on file  Social History Narrative   Not on file   Social Determinants of Health   Financial Resource Strain: Not on file  Food Insecurity: No Food Insecurity (12/25/2022)   Hunger Vital Sign    Worried About Running Out of Food in the Last Year: Never true     Ran Out of Food in the Last Year: Never true  Transportation Needs: No Transportation Needs (12/25/2022)   PRAPARE - Hydrologist (Medical): No    Lack of Transportation (Non-Medical): No  Physical Activity: Not on file  Stress: Not on file  Social Connections: Not on file  Intimate Partner Violence: Not At Risk (12/25/2022)   Humiliation, Afraid, Rape, and Kick questionnaire    Fear of Current or Ex-Partner: No    Emotionally Abused: No    Physically Abused: No    Sexually Abused: No     Medications Prior to Admission  Medication Sig Dispense Refill Last Dose   allopurinol (ZYLOPRIM) 300 MG tablet Take 300 mg by mouth daily.    12/25/2022   aspirin EC 325 MG tablet Take 325 mg by mouth daily as needed for moderate pain.   12/25/2022   atorvastatin (LIPITOR) 20 MG tablet Take 20 mg by mouth daily.   12/25/2022   esomeprazole (NEXIUM) 20 MG capsule Take 20 mg by mouth daily.    12/25/2022   metoprolol succinate (TOPROL-XL) 100 MG 24 hr tablet Take 100 mg by mouth daily. Take with or immediately following a meal.   12/25/2022 at 0930   Multiple Vitamin (MULTIVITAMIN WITH MINERALS) TABS tablet Take 1 tablet by mouth daily. Men +   12/25/2022   potassium chloride SA (KLOR-CON M) 20 MEQ tablet TAKE 1 TABLET EVERY DAY 90 tablet 3 12/25/2022   Torsemide 40 MG TABS Take 40 mg by mouth 2 (two) times daily. (Patient taking differently: Take 40-80 mg by mouth See admin instructions. Taking 80 mg in the morning and 40 mg in the evening) 180 tablet 3 12/25/2022    Review of Systems  Cardiovascular:  Negative for chest pain, dyspnea on exertion, leg swelling, palpitations and syncope.  Musculoskeletal:  Positive for back pain.      Physical Exam: Physical Exam Vitals and nursing note reviewed.  Constitutional:      General: He is not in acute distress. Neck:     Vascular: No JVD.  Cardiovascular:     Rate and Rhythm: Normal rate and regular rhythm.     Heart sounds:  Normal heart sounds. No murmur heard. Pulmonary:     Effort: Pulmonary effort is normal.     Breath sounds: Normal breath sounds. No wheezing or rales.  Musculoskeletal:     Right lower leg: Edema (Trace) present.     Left lower leg: Edema (Trace) present.        Imaging/tests reviewed and independently interpreted: Lab Results: CBC, BMP  Cardiac Studies:  Telemetry 12/26/2022: Episodes of sins pauses, longest 5.2 sec Intermittent Mobitz 1 AV block Episodes of accelerated junctional rhythm/ectopic atrial rhythm No definitive evidence of Afib.        EKG 12/25/2022:  Sinus rhythm with Mobitz 1 AV block   Cardioversion 08/08/2021: [id number] Successful   Echocardiogram 07/11/2021:  1. Left ventricular ejection fraction, by estimation, is 55 to 60%. The  left ventricle has normal function. The left ventricle has no regional  wall motion abnormalities. There is mild left ventricular hypertrophy.  Left ventricular diastolic parameters are indeterminate.   2. Right ventricular systolic function is normal. The right ventricular  size is normal. There is normal pulmonary artery systolic pressure.   3. The mitral valve is normal in structure. No evidence of mitral valve  regurgitation. No evidence of mitral stenosis.   4. The aortic valve is normal in structure. Aortic valve regurgitation is  not visualized. No aortic stenosis is present.   5. Aortic dilatation noted. There is mild dilatation of the ascending  aorta, measuring 42 mm.   6. The inferior vena cava is dilated in size with <50% respiratory  variability, suggesting right atrial pressure of 15 mmHg.    EKG 07/18/2021: Atrial flutter with 4:1 AV block Ventricular rate 68 bpm   Assessment & Recommendations:  82 y.o. Caucasian male  with hypertension, h/o prostate cancer s/p prostatectomy and radiation, h/o Barrett's esophagus, chronic leg edema, h/o atrial flutter during SIRS (07/2021), now admitted with spontaneous  retroperitoneal hematoma. Cardiology consulted for pauses on telemetry.   Arrhythmia: Episodes of sinus pauses, longest 5.2 sec Intermittent Mobitz 1 AV block Episodes of accelerated junctional rhythm/ectopic atrial rhythm No definitive evidence of Afib.   I suspect he has sinus node dysfunction, as well as some degree of AV dysfunction. That said, he is also on metoprolol succinate 100 mg daily. In addition, untreated OSA, pain from retroperitoneal hematoma could be contributing. Fortunately, he is asymptomatic from sinus pauses of up to 5.2 sec.  At this time, recommend holding metoprolol. Will obtain echocardiogram. I will also arrange outpatient 2 week cardiac telemetry. Should he have recurrence of either tachy or bradyarrhythmia off metoprolol, will re-evaluate.  Retroperitoneal hematoma: Management as per the primaryteam.   Discussed interpretation of tests and management recommendations with the primary team     Nigel Mormon, MD Pager: [phone] Office: [phone]

## 2022-12-26 NOTE — Evaluation (Signed)
Physical Therapy Evaluation Patient Details Name: Matthew [name]. MRN: [id number] DOB: Mar 01, 1941 Today's Date: 12/26/2022  History of Present Illness  Pt is 82 year old male admitted 12/25/22 presented with sudden episode of right hip pain while he was trying to step over his dog with near fall and was then unable to get out of his recliner Patient admitted for Spontaneous retroperitoneal hematoma. Patient also found to have HR sinus pauses and being followed by cardiology. Pt with metastatic prostate cancer to the bone history of a flutter status post cardioversion-not on anticoagulants or aspirin, hypertension chronic systolic CHF CKD stage III with baseline creatinine 1.5-1.9, GERD  Clinical Impression   Pt admitted with above diagnosis. At baseline, pt independent.  Today, pt supervision level for OOB transfers and to ambulate 300' in hallway.  He reports R hip/back pain significantly improved but still present and had mild antalgic pattern when not using RW.  He does demonstrate decreased R hip strength with increased pain with ROM.  Pt also reports some recent near falls at home due to catching R toe. Will benefit from acute PT while hospitalized and outpt PT to address R hip/back pain , strength, and balance. Pt currently with functional limitations due to the deficits listed below (see PT Problem List). Pt will benefit from skilled PT to increase their independence and safety with mobility to allow discharge to the venue listed below.          Recommendations for follow up therapy are one component of a multi-disciplinary discharge planning process, led by the attending physician.  Recommendations may be updated based on patient status, additional functional criteria and insurance authorization.  Follow Up Recommendations Outpatient PT (For R hip pain)      Assistance Recommended at Discharge PRN  Patient can return home with the following  Assistance with cooking/housework;Help with  stairs or ramp for entrance    Equipment Recommendations Rolling walker (2 wheels)  Recommendations for Other Services       Functional Status Assessment Patient has had a recent decline in their functional status and demonstrates the ability to make significant improvements in function in a reasonable and predictable amount of time.     Precautions / Restrictions Precautions Precautions: Fall Precaution Comments: Incontinent Restrictions Weight Bearing Restrictions: No      Mobility  Bed Mobility Overal bed mobility: Modified Independent Bed Mobility: Sit to Supine       Sit to supine: Modified independent (Device/Increase time)   General bed mobility comments: increased time and use of bed rails.    Transfers Overall transfer level: Needs assistance Equipment used: Standard walker Transfers: Sit to/from Stand Sit to Stand: Supervision           General transfer comment: Performed x 4 during session, cues for controlled descent    Ambulation/Gait Ambulation/Gait assistance: Supervision Gait Distance (Feet): 300 Feet Assistive device: Rolling walker (2 wheels), None Gait Pattern/deviations: Step-through pattern Gait velocity: normal     General Gait Details: 200' with RW and 100' without RW; mild antalgic pattern without RW but reports not painful.  Pt reports couple near falls at home due to not picking up R foot well.  Stairs            Wheelchair Mobility    Modified Rankin (Stroke Patients Only)       Balance Overall balance assessment: Needs assistance Sitting-balance support: No upper extremity supported Sitting balance-Leahy Scale: Good     Standing balance support: No upper extremity  supported Standing balance-Leahy Scale: Good                               Pertinent Vitals/Pain Pain Assessment Pain Assessment: 0-10 Pain Score: 2  Pain Location: R low back and hip flexor Pain Descriptors / Indicators:  Discomfort Pain Intervention(s): Limited activity within patient's tolerance, Monitored during session, Premedicated before session    [place] expects to be discharged to:: Private residence Living Arrangements: Spouse/significant other Available Help at Discharge: Family Type of Home: House Home Access: Stairs to enter Entrance Stairs-Rails: Can reach both Entrance Stairs-Number of Steps: 5   Home Layout: One level Home Equipment: Cane - single point Additional Comments: back deck has ramp    Prior Function Prior Level of Function : Independent/Modified Independent             Mobility Comments: uses cane in the community ADLs Comments: independent - socks and shoes are difficult     Hand Dominance   Dominant Hand: Right    Extremity/Trunk Assessment   Upper Extremity Assessment Upper Extremity Assessment: Overall WFL for tasks assessed    Lower Extremity Assessment Lower Extremity Assessment: LLE deficits/detail;RLE deficits/detail RLE Deficits / Details: ROM: WFL; MMT: hip 1/5, knee 4/5, ankle 5/5; limited by pain LLE Deficits / Details: ROM WFL; MMT 5/5    Cervical / Trunk Assessment Cervical / Trunk Assessment: Normal  Communication   Communication: No difficulties  Cognition Arousal/Alertness: Awake/alert Behavior During Therapy: WFL for tasks assessed/performed Overall Cognitive Status: Within Functional Limits for tasks assessed                                          General Comments General comments (skin integrity, edema, etc.): Provided with gait belt to use as leg lifter to assist with bed mobility R LE.    Exercises General Exercises - Lower Extremity Heel Slides: AAROM, Right, 10 reps, Supine Other Exercises Other Exercises: pelvic tilts x 10 supine   Assessment/Plan    PT Assessment Patient needs continued PT services  PT Problem List Decreased strength;Pain;Decreased range of motion;Decreased  activity tolerance;Decreased knowledge of use of DME;Decreased balance;Decreased mobility;Decreased knowledge of precautions       PT Treatment Interventions DME instruction;Therapeutic exercise;Gait training;Balance training;Stair training;Modalities;Functional mobility training;Therapeutic activities;Patient/family education    PT Goals (Current goals can be found in the Care Plan section)  Acute Rehab PT Goals Patient Stated Goal: return home PT Goal Formulation: With patient Time For Goal Achievement: 01/09/23 Potential to Achieve Goals: Good Additional Goals Additional Goal #1: Pt will increase R hip strength to 3/5 for improved transfers    Frequency Min 3X/week     Co-evaluation               AM-PAC PT "6 Clicks" Mobility  Outcome Measure Help needed turning from your back to your side while in a flat bed without using bedrails?: None Help needed moving from lying on your back to sitting on the side of a flat bed without using bedrails?: None Help needed moving to and from a bed to a chair (including a wheelchair)?: A Little Help needed standing up from a chair using your arms (e.g., wheelchair or bedside chair)?: A Little Help needed to walk in hospital room?: A Little Help needed climbing 3-5 steps with a railing? : A  Little 6 Click Score: 20    End of Session Equipment Utilized During Treatment: Gait belt Activity Tolerance: Patient tolerated treatment well Patient left: in bed;with call bell/phone within reach;with bed alarm set Nurse Communication: Mobility status PT Visit Diagnosis: Muscle weakness (generalized) (M62.81);Pain Pain - Right/Left: Right Pain - part of body: Hip    Time: 7225-7505 PT Time Calculation (min) (ACUTE ONLY): 20 min   Charges:   PT Evaluation $PT Eval Low Complexity: 1 Low          Jaydalee Bardwell, PT Acute Rehab [facility] [phone]   Karlton Lemon 12/26/2022, 5:50 PM

## 2022-12-26 NOTE — Progress Notes (Signed)
  Echocardiogram 2D Echocardiogram has been performed.  Frances Furbish 12/26/2022, 2:46 PM

## 2022-12-26 NOTE — Assessment & Plan Note (Signed)
Stable

## 2022-12-26 NOTE — Plan of Care (Signed)

## 2022-12-26 NOTE — Assessment & Plan Note (Addendum)
Observation telemetry bed. Pt not taking any ASA, antiplatelets or systemic anticoagulants. Appears to be a spontaneous hemorrhage. General surgery contacted by EDP for consult. Prn hydrocodone or IV dilaudid for pain.

## 2022-12-26 NOTE — Evaluation (Signed)
Occupational Therapy Evaluation Patient Details Name: Matthew [name]. MRN: [id number] DOB: May 15, 1941 Today's Date: 12/26/2022   History of Present Illness 82 year old male with metastatic prostate cancer to the bone history of a flutter status post cardioversion-not on anticoagulants or aspirin, hypertension chronic systolic CHF CKD stage III with baseline creatinine 1.5-1.9, GERD presented with sudden episode of right hip pain while he was trying to step over his dog, unable to get out of his recliner EMS was called and brought to the ED. Patient admitted for   Spontaneous retroperitoneal hematoma. Patient also found to have HR sinus pauses and being followed by cardiology.   Clinical Impression   [name] presents near his baseline functionally. He is able to perform bed mobility and ADLs near his baseline. He required use of a walker and reports difficulty and pain in RLE but able to ambulate in the hall. From a functional standpoint he has no OT needs. He would like a walker and suspect he may need further PT services at discharge.      Recommendations for follow up therapy are one component of a multi-disciplinary discharge planning process, led by the attending physician.  Recommendations may be updated based on patient status, additional functional criteria and insurance authorization.   Follow Up Recommendations  No OT follow up     Assistance Recommended at Discharge Intermittent Supervision/Assistance  Patient can return home with the following Assistance with cooking/housework;Help with stairs or ramp for entrance    Functional Status Assessment  Patient has not had a recent decline in their functional status  Equipment Recommendations  None recommended by OT    Recommendations for Other Services       Precautions / Restrictions Precautions Precautions: Fall Precaution Comments: Incontinent Restrictions Weight Bearing Restrictions: No      Mobility Bed  Mobility Overal bed mobility: Modified Independent             General bed mobility comments: increased time and use of bed rails.    Transfers Overall transfer level: Needs assistance Equipment used: Rolling walker (2 wheels) Transfers: Sit to/from Stand Sit to Stand: Supervision           General transfer comment: Min guard to supervision for standing and ambulation in hall. No overt loss of balance. Reports improved RLE pain.      Balance Overall balance assessment: Mild deficits observed, not formally tested                                         ADL either performed or assessed with clinical judgement   ADL Overall ADL's : At baseline                                       General ADL Comments: Has difficulty with LB dressing and needs low surface.     Vision Baseline Vision/History: 1 Wears glasses       Perception     Praxis      Pertinent Vitals/Pain Pain Assessment Pain Assessment: No/denies pain     Hand Dominance Right   Extremity/Trunk Assessment Upper Extremity Assessment Upper Extremity Assessment: Overall WFL for tasks assessed   Lower Extremity Assessment Lower Extremity Assessment: Defer to PT evaluation   Cervical / Trunk Assessment Cervical / Trunk Assessment: Normal  Communication Communication Communication: No difficulties   Cognition Arousal/Alertness: Awake/alert Behavior During Therapy: WFL for tasks assessed/performed Overall Cognitive Status: Within Functional Limits for tasks assessed                                       General Comments       Exercises     Shoulder Instructions      Home Living Family/patient expects to be discharged to:: Private residence Living Arrangements: Spouse/significant other Available Help at Discharge: Family Type of Home: Other(Comment) Home Access: Stairs to enter Technical brewer of Steps: 5 Entrance Stairs-Rails:  Can reach both Home Layout: One level     Bathroom Shower/Tub: Occupational psychologist: [place]: [employer] - single point   Additional Comments: back deck has ramp      Prior Functioning/Environment Prior Level of Function : Independent/Modified Independent             Mobility Comments: uses cane in the community ADLs Comments: independent - socks and shoes are difficult        OT Problem List: Pain      OT Treatment/Interventions:      OT Goals(Current goals can be found in the care plan section) Acute Rehab OT Goals OT Goal Formulation: All assessment and education complete, DC therapy  OT Frequency:      Co-evaluation              AM-PAC OT "6 Clicks" Daily Activity     Outcome Measure Help from another person eating meals?: None Help from another person taking care of personal grooming?: None Help from another person toileting, which includes using toliet, bedpan, or urinal?: None Help from another person bathing (including washing, rinsing, drying)?: None Help from another person to put on and taking off regular upper body clothing?: None Help from another person to put on and taking off regular lower body clothing?: None 6 Click Score: 24   End of Session Equipment Utilized During Treatment: Rolling walker (2 wheels) Nurse Communication: Mobility status  Activity Tolerance: Patient tolerated treatment well Patient left: in chair;with call bell/phone within reach;with family/visitor present  OT Visit Diagnosis: Pain                Time: 5427-0623 OT Time Calculation (min): 22 min Charges:  OT General Charges $OT Visit: 1 Visit OT Evaluation $OT Eval Low Complexity: 1 Low  Gustavo Lah, OTR/L [place]  Office [phone]   Lenward Chancellor 12/26/2022, 4:29 PM

## 2022-12-26 NOTE — TOC Initial Note (Signed)
Transition of Care (TOC) - Initial/Assessment Note    Patient Details  Name: Matthew [name]. MRN: [id number] Date of Birth: [birth year]  Transition of Care [facility] CM/SW Contact:    Leeroy Cha, RN Phone Number: 12/26/2022, 8:09 AM  Clinical Narrative:                  Transition of Care [facility]) Screening Note   Patient Details  Name: Matthew [name]. Date of Birth: 10-17-1941   Transition of Care [facility]) CM/SW Contact:    Leeroy Cha, RN Phone Number: 12/26/2022, 8:09 AM    Transition of Care Department [facility]) has reviewed patient and no TOC needs have been identified at this time. We will continue to monitor patient advancement through interdisciplinary progression rounds. If new patient transition needs arise, please place a TOC consult.    Expected Discharge Plan: Home/Self Care Barriers to Discharge: Continued Medical Work up   Patient Goals and CMS Choice Patient states their goals for this hospitalization and ongoing recovery are:: to go home and be well CMS Medicare.gov Compare Post Acute Care list provided to:: Patient Choice offered to / list presented to : Patient      Expected Discharge Plan and Services   Discharge Planning Services: CM Consult   Living arrangements for the past 2 months: Single Family Home                                      Prior Living Arrangements/Services Living arrangements for the past 2 months: Single Family Home Lives with:: Spouse Patient language and need for interpreter reviewed:: Yes Do you feel safe going back to the place where you live?: Yes            Criminal Activity/Legal Involvement Pertinent to Current Situation/Hospitalization: No - Comment as needed  Activities of Daily Living Home Assistive Devices/Equipment: Cane (specify quad or straight), Hearing aid ADL Screening (condition at time of admission) Patient's cognitive ability adequate to safely complete daily activities?: Yes Is the  patient deaf or have difficulty hearing?: Yes Does the patient have difficulty seeing, even when wearing glasses/contacts?: No Does the patient have difficulty concentrating, remembering, or making decisions?: No Patient able to express need for assistance with ADLs?: Yes Does the patient have difficulty dressing or bathing?: No Independently performs ADLs?: Yes (appropriate for developmental age) Does the patient have difficulty walking or climbing stairs?: Yes Weakness of Legs: Right Weakness of Arms/Hands: None  Permission Sought/Granted                  Emotional Assessment Appearance:: Appears stated age Attitude/Demeanor/Rapport: Engaged Affect (typically observed): Calm Orientation: : Oriented to Self, Oriented to Place, Oriented to  Time, Oriented to Situation Alcohol / Substance Use: Alcohol Use (occassional) Psych Involvement: No (comment)  Admission diagnosis:  Retroperitoneal hematoma [K68.3] Patient Active Problem List   Diagnosis Date Noted   Chronic kidney disease, stage 3a ([place]) - Baseline Scr 1.5-1.9 12/26/2022   Gastro-esophageal reflux disease with esophagitis 12/26/2022   Paroxysmal atrial fibrillation ([place]) 12/26/2022   Retroperitoneal hematoma 12/25/2022   Typical atrial flutter ([place]) 07/18/2021   Chronic heart failure with preserved ejection fraction ([place]) 07/18/2021   Essential hypertension 07/11/2021   Malignant neoplasm of prostate metastatic to bone ([place]) 04/25/2021   Primary hyperparathyroidism ([place]) 02/25/2020   PCP:  Wenda Low, MD Pharmacy:   [facility] - [place], [place] - [address] [place] [address] Phone: [phone] Fax: [phone]  [id number]Phone: [phone]: [phone] [place] [address]Phone: [phone]: [phone]    Social Determinants of Health (SDOH) Social History: [place] No Food Insecurity (12/25/2022)  Housing: Low Risk  (12/25/2022)  Transportation Needs: No Transportation Needs (12/25/2022)  Utilities: Not At Risk (12/25/2022)  Tobacco Use: Low Risk  (12/26/2022)   SDOH Interventions:     Readmission Risk Interventions   No data to display

## 2022-12-26 NOTE — Subjective & Objective (Signed)
CC: right hip pain HPI: 82 year old male history of metastatic prostate cancer to the bone, history of atrial flutter status post cardioversion, hypertension, chronic systolic heart failure, CKD stage IIIa baseline creatinine 1.5-1.9, GERD presents to the ER today with sudden onset of right hip pain that started today.  He states that he was trying to step over his dog.  He had a sudden pain in his right groin/hip.  He was unable to get out of his recliner.  EMS was called.  Patient was noted to have sinus pauses on his telemetry and EMS.  Due to continued pain in his hip, CT renal stone was done which showed a retroperitoneal intramuscular hematoma of the right psoas muscle measuring 8 x 8 cm.  Patient is not on any systemic anticoagulation.  Does not take aspirin on a regular basis.  General surgery was consulted.  [facility] hospitalist contacted for admission.  On arrival to [facility], he was noted to have several pauses on his telemetry of 4 to 5 seconds.  Patient was completely asymptomatic.  EKG demonstrates Mobitz type I secondary block.  His telemetry shows atrial fibrillation without rapid ventricular response.  Patient just been seen by his cardiologist on 12/19/2022 and he was in sinus rhythm at time.  Cardiology recommended that he not be on anticoagulation since he was in sinus rhythm during that office visit

## 2022-12-26 NOTE — Assessment & Plan Note (Signed)
Continue torsemide 80 mg qam and 40 mg qhs(per patient). With his 4-5 sec pauses, will hold his toprol-xl.

## 2022-12-26 NOTE — Assessment & Plan Note (Signed)
Chronic. Baseline Scr 1.5-1.9

## 2022-12-26 NOTE — H&P (Signed)
History and Physical    Matthew [name]. TIR:[id number] DOB: [birth year] DOA: 12/25/2022  DOS: the patient was seen and examined on 12/25/2022  PCP: Wenda Low, MD   Patient coming from: Home  I have personally briefly reviewed patient's old medical records in [place]  CC: right hip pain HPI: 82 year old male history of metastatic prostate cancer to the bone, history of atrial flutter status post cardioversion, hypertension, chronic systolic heart failure, CKD stage IIIa baseline creatinine 1.5-1.9, GERD presents to the ER today with sudden onset of right hip pain that started today.  He states that he was trying to step over his dog.  He had a sudden pain in his right groin/hip.  He was unable to get out of his recliner.  EMS was called.  Patient was noted to have sinus pauses on his telemetry and EMS.  Due to continued pain in his hip, CT renal stone was done which showed a retroperitoneal intramuscular hematoma of the right psoas muscle measuring 8 x 8 cm.  Patient is not on any systemic anticoagulation.  Does not take aspirin on a regular basis.  General surgery was consulted.  [facility] hospitalist contacted for admission.  On arrival to [facility], he was noted to have several pauses on his telemetry of 4 to 5 seconds.  Patient was completely asymptomatic.  EKG demonstrates Mobitz type I secondary block.  His telemetry shows atrial fibrillation without rapid ventricular response.  Patient just been seen by his cardiologist on 12/19/2022 and he was in sinus rhythm at time.  Cardiology recommended that he not be on anticoagulation since he was in sinus rhythm during that office visit     ED Course: CT urogram shows right psoas hematoma  Review of Systems:  Review of Systems  Constitutional: Negative.   HENT: Negative.    Eyes: Negative.   Cardiovascular:  Positive for leg swelling. Negative for chest pain and palpitations.       Chronic LE edema  Gastrointestinal:  Negative.   Genitourinary: Negative.   Musculoskeletal:  Positive for joint pain.       Right hip pain  Skin: Negative.   Neurological: Negative.   Endo/Heme/Allergies: Negative.   Psychiatric/Behavioral: Negative.    All other systems reviewed and are negative.   Past Medical History:  Diagnosis Date   Arthritis    Barrett's esophagus    Cancer ([place]) 2004   prostate    Dysrhythmia    GERD (gastroesophageal reflux disease)    pt denies   History of kidney stones    Hyperlipidemia    Hypertension    Pneumonia    In high school   Prostate cancer [facility])     Past Surgical History:  Procedure Laterality Date   CARDIOVERSION N/A 08/08/2021   Procedure: CARDIOVERSION;  Surgeon: Nigel Mormon, MD;  Location: [place];  Service: Cardiovascular;  Laterality: N/A;   COLONOSCOPY WITH PROPOFOL N/A 01/09/2016   Procedure: COLONOSCOPY WITH PROPOFOL;  Surgeon: Garlan Fair, MD;  Location: [facility];  Service: Endoscopy;  Laterality: N/A;   CYSTOSCOPY     x 1   CYSTOSCOPY WITH RETROGRADE PYELOGRAM, URETEROSCOPY AND STENT PLACEMENT Left 03/30/2022   Procedure: CYSTOSCOPY WITH LEFT RETROGRADE PYELOGRAM, URETEROSCOPY AND STENT PLACEMENT;  Surgeon: Irine Seal, MD;  Location: [facility];  Service: Urology;  Laterality: Left;  1 HR FOR CASE   ESOPHAGOGASTRODUODENOSCOPY (EGD) WITH PROPOFOL N/A 01/09/2016   Procedure: ESOPHAGOGASTRODUODENOSCOPY (EGD) WITH PROPOFOL;  Surgeon: Ursula Alert  Wynetta Emery, MD;  Location: [facility];  Service: Endoscopy;  Laterality: N/A;   EXTRACORPOREAL SHOCK WAVE LITHOTRIPSY Left 01/25/2022   Procedure: LEFT EXTRACORPOREAL SHOCK WAVE LITHOTRIPSY (ESWL);  Surgeon: Vira Agar, MD;  Location: [facility];  Service: Urology;  Laterality: Left;   EYE SURGERY     bil cataract   KNEE ARTHROSCOPY Left 25 yrs ago   PARATHYROIDECTOMY N/A 02/25/2020   Procedure: NECK EXPLORATION WITH PARATHYROIDECTOMY;  Surgeon: Armandina Gemma, MD;  Location: [facility];   Service: General;  Laterality: N/A;   PROSTATECTOMY  2004     reports that he has never smoked. He has never used smokeless tobacco. He reports current alcohol use of about 4.0 standard drinks of alcohol per week. He reports that he does not use drugs.  No Known Allergies  Family History  Problem Relation Age of Onset   Heart failure Mother    Heart disease Mother    Breast cancer Neg Hx    Prostate cancer Neg Hx    Colon cancer Neg Hx    Pancreatic cancer Neg Hx     Prior to Admission medications   Medication Sig Start Date End Date Taking? Authorizing Provider  allopurinol (ZYLOPRIM) 300 MG tablet Take 300 mg by mouth daily.     [provider]  aspirin EC 325 MG tablet Take 325 mg by mouth daily as needed for moderate pain.    [provider]  atorvastatin (LIPITOR) 20 MG tablet Take 20 mg by mouth daily. 02/02/20   [provider]  esomeprazole (NEXIUM) 20 MG capsule Take 20 mg by mouth daily.     [provider]  metoprolol succinate (TOPROL-XL) 100 MG 24 hr tablet Take 100 mg by mouth daily. Take with or immediately following a meal.    [provider]  Multiple Vitamin (MULTIVITAMIN WITH MINERALS) TABS tablet Take 1 tablet by mouth daily. Men +    [provider]  potassium chloride SA (KLOR-CON M) 20 MEQ tablet TAKE 1 TABLET EVERY DAY 07/25/22   Patwardhan, Manish J, MD  Torsemide 40 MG TABS Take 40 mg by mouth 2 (two) times daily. 09/25/22   Nigel Mormon, MD    Physical Exam: Vitals:   12/25/22 2346 12/25/22 2347 12/25/22 2353 12/26/22 0000  BP:   118/84 105/82  Pulse:   78 73  Resp: '15 18 14 14  '$ Temp:      TempSrc:      SpO2:   95% 97%  Weight:      Height:        Physical Exam Vitals and nursing note reviewed.  Constitutional:      General: He is not in acute distress.    Appearance: Normal appearance. He is not ill-appearing, toxic-appearing or diaphoretic.     Comments: Appears younger than stated  age of 13  HENT:     Head: Normocephalic and atraumatic.     Nose: Nose normal.  Eyes:     General: No scleral icterus. Cardiovascular:     Rate and Rhythm: Normal rate. Rhythm irregular.     Pulses: Normal pulses.  Pulmonary:     Effort: Pulmonary effort is normal. No respiratory distress.     Breath sounds: Normal breath sounds. No wheezing.  Abdominal:     General: Abdomen is flat. Bowel sounds are normal. There is no distension.     Palpations: Abdomen is soft.     Tenderness: There is no abdominal tenderness.  Musculoskeletal:     Right lower leg: No edema.     Left lower leg: No edema.  Skin:    General: Skin is warm and dry.     Capillary Refill: Capillary refill takes less than 2 seconds.  Neurological:     General: No focal deficit present.     Mental Status: He is alert and oriented to person, place, and time.      Labs on Admission: I have personally reviewed following labs and imaging studies  CBC: Recent Labs  Lab 12/25/22 1950  WBC 12.5*  NEUTROABS 10.8*  HGB 12.5*  HCT 38.7*  MCV 96.8  PLT 371   Basic Metabolic Panel: Recent Labs  Lab 12/25/22 1950  NA 139  K 4.3  CL 108  CO2 24  GLUCOSE 143*  BUN 34*  CREATININE 1.55*  CALCIUM 8.5*   GFR: Estimated Creatinine Clearance: 47.5 mL/min (A) (by C-G formula based on SCr of 1.55 mg/dL (H)). Liver Function Tests: Recent Labs  Lab 12/25/22 1950  AST 18  ALT 15  ALKPHOS 68  BILITOT 0.5  PROT 6.6  ALBUMIN 3.5   No results for input(s): "LIPASE", "AMYLASE" in the last 168 hours. No results for input(s): "AMMONIA" in the last 168 hours. Coagulation Profile: No results for input(s): "INR", "PROTIME" in the last 168 hours. Cardiac Enzymes: No results for input(s): "CKTOTAL", "CKMB", "CKMBINDEX", "TROPONINI", "TROPONINIHS" in the last 168 hours. BNP (last 3 results) No results for input(s): "PROBNP" in the last 8760 hours. HbA1C: No results for input(s): "HGBA1C" in the last 72  hours. CBG: No results for input(s): "GLUCAP" in the last 168 hours. Lipid Profile: No results for input(s): "CHOL", "HDL", "LDLCALC", "TRIG", "CHOLHDL", "LDLDIRECT" in the last 72 hours. Thyroid Function Tests: No results for input(s): "TSH", "T4TOTAL", "FREET4", "T3FREE", "THYROIDAB" in the last 72 hours. Anemia Panel: No results for input(s): "VITAMINB12", "FOLATE", "FERRITIN", "TIBC", "IRON", "RETICCTPCT" in the last 72 hours. Urine analysis:    Component Value Date/Time   COLORURINE YELLOW 07/10/2021 2330   APPEARANCEUR HAZY (A) 07/10/2021 2330   LABSPEC 1.019 07/10/2021 2330   PHURINE 6.0 07/10/2021 2330   GLUCOSEU NEGATIVE 07/10/2021 2330   HGBUR NEGATIVE 07/10/2021 2330   BILIRUBINUR NEGATIVE 07/10/2021 2330   KETONESUR NEGATIVE 07/10/2021 2330   PROTEINUR NEGATIVE 07/10/2021 2330   NITRITE NEGATIVE 07/10/2021 2330   LEUKOCYTESUR LARGE (A) 07/10/2021 2330    Radiological Exams on Admission: I have personally reviewed images CT Renal Stone Study  Result Date: 12/25/2022 CLINICAL DATA:  Abdominal and flank pain, right-sided. EXAM: CT ABDOMEN AND PELVIS WITHOUT CONTRAST TECHNIQUE: Multidetector CT imaging of the abdomen and pelvis was performed following the standard protocol without IV contrast. RADIATION DOSE REDUCTION: This exam was performed according to the departmental dose-optimization program which includes automated exposure control, adjustment of the mA and/or kV according to patient size and/or use of iterative reconstruction technique. COMPARISON:  CT abdomen and pelvis 01/12/2022 FINDINGS: Lower chest: No acute abnormality. Hepatobiliary: No focal liver abnormality is seen. No gallstones, gallbladder wall thickening, or biliary dilatation. Pancreas: Unremarkable. No pancreatic ductal dilatation or surrounding inflammatory changes. Spleen: Normal in size without focal abnormality. Adrenals/Urinary Tract: There is no hydronephrosis. There is marked right renal cortical  scarring and atrophy, unchanged. There are innumerable cysts and mildly hyperdense lesions in both kidneys similar to the prior study. The largest hyperdense lesion is in the right kidney measuring 5.7 cm. Additionally there are nonobstructing right renal calculi measuring up to 13 mm. The  adrenal glands and bladder are within normal limits. Stomach/Bowel: No evidence of bowel wall thickening, distention, or inflammatory changes. There is sigmoid colon diverticulosis. The appendix is not visualized. There is a large amount of stool throughout the colon. There is a small hiatal hernia. The stomach is otherwise within normal limits. Vascular/Lymphatic: Aortic atherosclerosis. No enlarged abdominal or pelvic lymph nodes. Reproductive: Prostate gland is small or surgically absent. Other: There is intramuscular right retroperitoneal hematoma. The right psoas muscle is enlarged measuring up to 8.0 x 8.0 cm image 2/42. There is some mild fluid and stranding tracking along the right retroperitoneum as well. There are small fat containing inguinal hernias, left greater than right. There is no ascites or focal abdominal wall hernia. Musculoskeletal: Again seen are scattered sclerotic osseous lesions. Similar mildly increased in size. Multilevel degenerative changes affect the spine. IMPRESSION: 1. Right retroperitoneal hematoma. 2. Stable right renal cortical scarring and atrophy. 3. Nonobstructing right renal calculi. 4. Colonic diverticulosis. 5. Osseous metastatic disease, mildly increased. 6. Right Bosniak II benign renal cyst measuring 5.7 cm. No follow-up imaging is recommended. JACR 2018 Feb; 264-273, Management of the Incidental Renal Mass on CT, [employer] 2021; 814-848, Bosniak Classification of Cystic Renal Masses, Version 2019. Aortic Atherosclerosis (ICD10-I70.0). Electronically Signed   By: Ronney Asters M.D.   On: 12/25/2022 19:33   DG Lumbar Spine Complete  Result Date: 12/25/2022 CLINICAL DATA:  Pain.   No reported history of injury. EXAM: LUMBAR SPINE - COMPLETE 4+ VIEW COMPARISON:  CT evaluation of January 12, 2022 FINDINGS: EKG leads project over the abdomen. Large RIGHT renal calculus incidentally noted not substantially changed compared to previous imaging measuring approximately 16 mm in the area of the lower pole the RIGHT kidney. Five lumbar type vertebral bodies. Degenerative changes throughout the lumbar spine greatest at L4-5 and L5-S1 with grade 1 anterolisthesis of L4 on L5 and L5 on S1 similar to imaging from February 2023. Mild-to-moderate degenerative changes elsewhere in the lumbar spine. Subtle retrolisthesis of L1 on L2 also similar to previous imaging. IMPRESSION: Degenerative changes throughout the spine greatest at L4-5 and L5-S1 associated with anterolisthesis at these levels, not changed since previous imaging. Large RIGHT renal calculus.  Correlate with signs of renal colic. Electronically Signed   By: Zetta Bills M.D.   On: 12/25/2022 18:14   DG Hip Unilat With Pelvis 2-3 Views Right  Result Date: 12/25/2022 CLINICAL DATA:  Pain EXAM: DG HIP (WITH OR WITHOUT PELVIS) 3V RIGHT COMPARISON:  None Available. FINDINGS: There is no evidence of hip fracture or dislocation. There is no evidence of arthropathy or other focal bone abnormality. Osteopenia. Surgical clips are seen in the pelvis. There is a sclerotic focus along the right supra-acetabular region. This is increased from prior CT scan August 2022. There is also a sclerotic focus along the right inferior pubic ramus and some subtle areas elsewhere. History of prostate cancer. Overlapping cardiac leads. IMPRESSION: 1. No acute fracture or dislocation. 2. Osteopenia. 3. Sclerotic foci along the right supra-acetabular region and right inferior pubic ramus. These are increased from prior CT scan. History of prostate cancer. Known sclerotic bone metastases. Please correlate with prior workup. No fracture or dislocation. Electronically  Signed   By: Jill Side M.D.   On: 12/25/2022 18:13    EKG: My personal interpretation of EKG shows: NSR with mobitz 1      Assessment/Plan Principal Problem:   Retroperitoneal hematoma Active Problems:   Paroxysmal atrial fibrillation (HCC)   Malignant neoplasm of  prostate metastatic to bone [facility])   Essential hypertension   Chronic heart failure with preserved ejection fraction (HCC)   Chronic kidney disease, stage 3a (HCC) - Baseline Scr 1.5-1.9   Gastro-esophageal reflux disease with esophagitis    Assessment and Plan: * Retroperitoneal hematoma Observation telemetry bed. Pt not taking any ASA, antiplatelets or systemic anticoagulants. Appears to be a spontaneous hemorrhage. General surgery contacted by EDP for consult. Prn hydrocodone or IV dilaudid for pain.  Paroxysmal atrial fibrillation (HCC) Has had several 4-5 sec pauses on telemetry. Pt is asymptomatic. Pt has had his atrial flutter cardioverted before. He has not had an ablation. Discussed that given his spontaneous hemorrhage, cannot start him on systemic anticoagulation. Also if he cannot start anticoagulation, he cannot get cardioversion. With his pauses, I wonder if he is having SSS. May need cardiology input. Update echo.  Chronic kidney disease, stage 3a (HCC) - Baseline Scr 1.5-1.9 Chronic. Baseline Scr 1.5-1.9  Chronic heart failure with preserved ejection fraction (HCC) Continue with torsemide 80 mg qam, 40 mg qpm. With his cardiac pauses(4-5 secs), will hold his toprol-xl.  Essential hypertension Continue torsemide 80 mg qam and 40 mg qhs(per patient). With his 4-5 sec pauses, will hold his toprol-xl.  Malignant neoplasm of prostate metastatic to bone (HCC) Chronic. Pt f/u with urology. States states he has XRT to his bony lesions last year. Has PET scan scheduled by urology due to increasing PSA.  Gastro-esophageal reflux disease with esophagitis Stable.   DVT prophylaxis: SCDs Code Status: Full  Code Family Communication: no family at bedside  Disposition Plan: return home  Consults called: EDP consulted Dr. Redmond Pulling from general surgery Admission status: Observation, Telemetry bed   Kristopher Oppenheim, DO [facility] 12/26/2022, 12:54 AM

## 2022-12-26 NOTE — Assessment & Plan Note (Addendum)
Has had several 4-5 sec pauses on telemetry. Pt is asymptomatic. Pt has had his atrial flutter cardioverted before. He has not had an ablation. Discussed that given his spontaneous hemorrhage, cannot start him on systemic anticoagulation. Also if he cannot start anticoagulation, he cannot get cardioversion. With his pauses, I wonder if he is having SSS. May need cardiology input. Update echo.

## 2022-12-27 ENCOUNTER — Telehealth: Payer: Self-pay

## 2022-12-27 DIAGNOSIS — K683 Retroperitoneal hematoma: Secondary | ICD-10-CM | POA: Diagnosis not present

## 2022-12-27 LAB — BASIC METABOLIC PANEL
Anion gap: 10 (ref 5–15)
BUN: 28 mg/dL — ABNORMAL HIGH (ref 8–23)
CO2: 26 mmol/L (ref 22–32)
Calcium: 8.3 mg/dL — ABNORMAL LOW (ref 8.9–10.3)
Chloride: 103 mmol/L (ref 98–111)
Creatinine, Ser: 1.77 mg/dL — ABNORMAL HIGH (ref 0.61–1.24)
GFR, Estimated: 38 mL/min — ABNORMAL LOW (ref >60)
Glucose, Bld: 111 mg/dL — ABNORMAL HIGH (ref 70–99)
Potassium: 3.6 mmol/L (ref 3.5–5.1)
Sodium: 139 mmol/L (ref 135–145)

## 2022-12-27 LAB — CBC
HCT: 36.2 % — ABNORMAL LOW (ref 39.0–52.0)
Hemoglobin: 11.6 g/dL — ABNORMAL LOW (ref 13.0–17.0)
MCH: 32 pg (ref 26.0–34.0)
MCHC: 32 g/dL (ref 30.0–36.0)
MCV: 100 fL (ref 80.0–100.0)
Platelets: 248 10*3/uL (ref 150–400)
RBC: 3.62 MIL/uL — ABNORMAL LOW (ref 4.22–5.81)
RDW: 14.2 % (ref 11.5–15.5)
WBC: 10.1 10*3/uL (ref 4.0–10.5)
nRBC: 0 % (ref 0.0–0.2)

## 2022-12-27 MED ORDER — LIDOCAINE 5 % EX PTCH: 1.0000 | MEDICATED_PATCH | CUTANEOUS | 0 refills | Status: AC

## 2022-12-27 NOTE — Telephone Encounter (Signed)
Location of hospitalization: [place] Reason for hospitalization: hip pain  Date of discharge: 12/27/2022 Date of first communication with patient: today Person contacting patient: Me Current symptoms: pain Do you understand why you were in the Hospital: Yes Questions regarding discharge instructions: None Where were you discharged to: Home Medications reviewed: Yes Allergies reviewed: Yes Dietary changes reviewed: Yes. Discussed low fat and low salt diet.  Referals reviewed: NA Activities of Daily Living: Able to with mild limitations Any transportation issues/concerns: None Any patient concerns: None Confirmed importance & date/time of Follow up appt: Yes Confirmed with patient if condition begins to worsen call. Pt was given the office number and encouraged to call back with questions or concerns: Yes

## 2022-12-27 NOTE — Plan of Care (Signed)

## 2022-12-27 NOTE — Progress Notes (Signed)
Discharge instructions provided. Pt verbalized understanding of all information. Wife present. Pt transported to car via wheel chair without event

## 2022-12-27 NOTE — Discharge Summary (Signed)
Physician Discharge Summary  Matthew [name]. IPJ:[id number] DOB: 04/10/1941 DOA: 12/25/2022  PCP: Wenda Low, MD  Admit date: 12/25/2022 Discharge date: 12/27/2022 Recommendations for Outpatient Follow-up:  Follow up with PCP in 1 weeks-call for appointment Please obtain CBC in one week Please have cardiac monitor from cardiology office at home to monitor  Discharge Dispo: Home Discharge Condition: Stable Code Status:   Code Status: Full Code Diet recommendation:  Diet Order             Diet Heart Room service appropriate? Yes; Fluid consistency: Thin  Diet effective now                    Brief/Interim Summary: 82 year old male with metastatic prostate cancer to the bone history of a flutter status post cardioversion-not on anticoagulants or aspirin, hypertension chronic systolic CHF CKD stage III with baseline creatinine 1.5-1.9, GERD presented with sudden episode of right hip pain while he was trying to step over his dog, unable to get out of his recliner EMS was called and brought to the ED. In the ED hemodynamically stable noted to have sinus pauses on his telemetry per EMS. CT renal stone was done which showed a retroperitoneal intramuscular hematoma of the right psoas muscle measuring 8 x 8 cm.General surgery was consulted.  [facility] contacted for admission. In ED he had several pauses on his telemetry of 4 to 5 seconds.Patient was completely asymptomatic. EKG demonstrates Mobitz type I secondary block.  His telemetry shows atrial fibrillation without rapid ventricular response. Patient just been seen by his cardiologist on 12/19/2022 and he was in sinus rhythm at time.  Cardiology recommended that he not be on anticoagulation since he was in sinus rhythm during that office visit. Pain is improving, H&H overall stable.  Seen by cardiology ordered echocardiogram advised to discontinue metoprolol for sinus pause up to 5.2-second and will be reevaluated with outpatient  telemetry.  Echo showed EF 55 to 60%, no RWMA mitral valve is normal aortic valve is tricuspid. Pain is much better able to mobilize well, feels comfortable going home today   Discharge Diagnoses:  Principal Problem:   Retroperitoneal hematoma Active Problems:   Paroxysmal atrial fibrillation (HCC)   Malignant neoplasm of prostate metastatic to bone [facility])   Essential hypertension   Chronic heart failure with preserved ejection fraction (HCC)   Chronic kidney disease, stage 3a (HCC) - Baseline Scr 1.5-1.9   Gastro-esophageal reflux disease with esophagitis  Spontaneous retroperitoneal hematoma : Hemoglobin remains stable General surgery has been consulted advised to monitor and trend H&H> and has been stable overall.  Currently not on aspirin antiplatelets or anticoagulants.  Advised if CBC in a week from PCP   PAF: Tele w/ sinus pauses of 4 to 5-second: Asymptomatic had a flutter that was cardioverted, in NSR.  Echocardiogram unremarkable.  Suspect his sinus node dysfunction as well as some degree of LV dysfunction also from metoprolol which has been discontinued possibly untreated OSA and pain also contributing advised to follow-up with PCP for OSA.  Continue cardiac monitoring at home as per cardiology.    CKD3a: - Baseline Scr 1.5-1.9 Stable at baseline   Chronic heart failure with preserved EF: On torsemide.Euvolemic. Essential hypertension: Holding metoprolol.  Continue diuretics   Malignant neoplasm of prostate metastatic to bone:Chronic. Pt f/u with urology. States states he has XRT to his bony lesions last year. Has PET scan scheduled by urology due to increasing PSA.   GERD:Stable  Consults: Cardiology Subjective:  Alert awake oriented resting comfortably minimal pain in his flank mobilizing well  Discharge Exam: Vitals:   12/27/22 0823 12/27/22 0900  BP:    Pulse: 89   Resp: 19 17  Temp:    SpO2: 95%    General: Pt is alert, awake, not in acute  distress Cardiovascular: RRR, S1/S2 +, no rubs, no gallops Respiratory: CTA bilaterally, no wheezing, no rhonchi Abdominal: Soft, NT, ND, bowel sounds + Extremities: no edema, no cyanosis  Discharge Instructions  Discharge Instructions     Discharge instructions   Complete by: As directed    Please check CBC in 5 to 7 days from primary care doctor. Follow-up with cardiology for cardiac monitor at home  Please call call MD or return to ER for similar or worsening recurring problem that brought you to hospital or if any fever,nausea/vomiting,abdominal pain, uncontrolled pain, chest pain,  shortness of breath or any other alarming symptoms.  Please follow-up your doctor as instructed in a week time and call the office for appointment.  Please avoid alcohol, smoking, or any other illicit substance and maintain healthy habits including taking your regular medications as prescribed.  You were cared for by a hospitalist during your hospital stay. If you have any questions about your discharge medications or the care you received while you were in the hospital after you are discharged, you can call the unit and ask to speak with the hospitalist on call if the hospitalist that took care of you is not available.  Once you are discharged, your primary care physician will handle any further medical issues. Please note that NO REFILLS for any discharge medications will be authorized once you are discharged, as it is imperative that you return to your primary care physician (or establish a relationship with a primary care physician if you do not have one) for your aftercare needs so that they can reassess your need for medications and monitor your lab values   Increase activity slowly   Complete by: As directed       Allergies as of 12/27/2022   No Known Allergies      Medication List     STOP taking these medications    aspirin EC 325 MG tablet   metoprolol succinate 100 MG 24 hr  tablet Commonly known as: TOPROL-XL       TAKE these medications    allopurinol 300 MG tablet Commonly known as: ZYLOPRIM Take 300 mg by mouth daily.   atorvastatin 20 MG tablet Commonly known as: LIPITOR Take 20 mg by mouth daily.   esomeprazole 20 MG capsule Commonly known as: NEXIUM Take 20 mg by mouth daily.   lidocaine 5 % Commonly known as: LIDODERM Place 1 patch onto the skin daily for 10 days. Remove & Discard patch within 12 hours or as directed by MD   multivitamin with minerals Tabs tablet Take 1 tablet by mouth daily. Men +   potassium chloride SA 20 MEQ tablet Commonly known as: KLOR-CON M TAKE 1 TABLET EVERY DAY   Torsemide 40 MG Tabs Take 40 mg by mouth 2 (two) times daily. What changed:  how much to take when to take this additional instructions        Follow-up Information     Wenda Low, MD Follow up in 1 week(s).   Specialty: Internal Medicine Contact information: [address] [place] [address] [phone]         Nigel Mormon, MD Follow up  in 1 week(s).   Specialties: Cardiology, Radiology Contact information: [place] [address] [phone]                No Known Allergies  The results of significant diagnostics from this hospitalization (including imaging, microbiology, ancillary and laboratory) are listed below for reference.    Microbiology: No results found for this or any previous visit (from the past 240 hour(s)).  Procedures/Studies: ECHOCARDIOGRAM COMPLETE  Result Date: 12/26/2022    ECHOCARDIOGRAM REPORT   Patient Name:   Matthew [name]. Date of Exam: 12/26/2022 Medical Rec #:  [id number]        Height:       72.0 in Accession #:    [id number]       Weight:       230.0 lb Date of Birth:  May 23, 1941         BSA:          2.261 m Patient Age:    82 years         BP:           116/76 mmHg Patient Gender: M                HR:           70 bpm.  Exam Location:  Inpatient Procedure: 2D Echo, Color Doppler and Cardiac Doppler Indications:    I48.91* Unspeicified atrial fibrillation  History:        Patient has prior history of Echocardiogram examinations. Risk                 Factors:Hypertension and Dyslipidemia.  Sonographer:    Phineas Douglas Referring Phys: [phone] [place]  1. Left ventricular ejection fraction, by estimation, is 55 to 60%. The left ventricle has normal function. The left ventricle has no regional wall motion abnormalities. Left ventricular diastolic parameters were normal.  2. Right ventricular systolic function is normal. The right ventricular size is normal.  3. Left atrial size was mildly dilated.  4. The mitral valve is normal in structure. No evidence of mitral valve regurgitation. No evidence of mitral stenosis.  5. The aortic valve is tricuspid. There is mild calcification of the aortic valve. There is mild thickening of the aortic valve. Aortic valve regurgitation is not visualized. Aortic valve sclerosis is present, with no evidence of aortic valve stenosis.  6. Aortic dilatation noted. There is mild dilatation of the aortic root, measuring 39 mm. There is moderate dilatation of the ascending aorta, measuring 43 mm.  7. The inferior vena cava is normal in size with greater than 50% respiratory variability, suggesting right atrial pressure of 3 mmHg. FINDINGS  Left Ventricle: Left ventricular ejection fraction, by estimation, is 55 to 60%. The left ventricle has normal function. The left ventricle has no regional wall motion abnormalities. The left ventricular internal cavity size was normal in size. There is  no left ventricular hypertrophy. Left ventricular diastolic parameters were normal. Right Ventricle: The right ventricular size is normal. No increase in right ventricular wall thickness. Right ventricular systolic function is normal. Left Atrium: Left atrial size was mildly dilated. Right Atrium: Right atrial  size was normal in size. Pericardium: There is no evidence of pericardial effusion. Mitral Valve: The mitral valve is normal in structure. No evidence of mitral valve regurgitation. No evidence of mitral valve stenosis. Tricuspid Valve: The tricuspid valve is normal in structure. Tricuspid valve regurgitation is not demonstrated.  No evidence of tricuspid stenosis. Aortic Valve: The aortic valve is tricuspid. There is mild calcification of the aortic valve. There is mild thickening of the aortic valve. Aortic valve regurgitation is not visualized. Aortic valve sclerosis is present, with no evidence of aortic valve stenosis. Pulmonic Valve: The pulmonic valve was normal in structure. Pulmonic valve regurgitation is not visualized. No evidence of pulmonic stenosis. Aorta: Aortic dilatation noted. There is mild dilatation of the aortic root, measuring 39 mm. There is moderate dilatation of the ascending aorta, measuring 43 mm. Venous: The inferior vena cava is normal in size with greater than 50% respiratory variability, suggesting right atrial pressure of 3 mmHg. IAS/Shunts: No atrial level shunt detected by color flow Doppler.  LEFT VENTRICLE PLAX 2D LVIDd:         4.50 cm      Diastology LVIDs:         2.70 cm      LV e' medial:    6.09 cm/s LV PW:         1.30 cm      LV E/e' medial:  14.8 LV IVS:        1.30 cm      LV e' lateral:   10.90 cm/s LVOT diam:     2.40 cm      LV E/e' lateral: 8.3 LV SV:         77 LV SV Index:   34 LVOT Area:     4.52 cm  LV Volumes (MOD) LV vol d, MOD A2C: 104.0 ml LV vol d, MOD A4C: 137.0 ml LV vol s, MOD A2C: 42.9 ml LV vol s, MOD A4C: 57.4 ml LV SV MOD A2C:     61.1 ml LV SV MOD A4C:     137.0 ml LV SV MOD BP:      71.4 ml RIGHT VENTRICLE             IVC RV Basal diam:  3.80 cm     IVC diam: 2.00 cm RV S prime:     19.10 cm/s TAPSE (M-mode): 2.3 cm LEFT ATRIUM             Index        RIGHT ATRIUM           Index LA diam:        3.90 cm 1.72 cm/m   RA Area:     22.00 cm LA Vol  (A2C):   53.7 ml 23.75 ml/m  RA Volume:   60.90 ml  26.94 ml/m LA Vol (A4C):   84.6 ml 37.42 ml/m LA Biplane Vol: 73.4 ml 32.46 ml/m  AORTIC VALVE LVOT Vmax:   105.00 cm/s LVOT Vmean:  63.800 cm/s LVOT VTI:    0.170 m  AORTA Ao Root diam: 3.90 cm Ao Asc diam:  4.30 cm MITRAL VALVE MV Area (PHT): 3.56 cm    SHUNTS MV Decel Time: 213 msec    Systemic VTI:  0.17 m MV E velocity: 90.00 cm/s  Systemic Diam: 2.40 cm MV A velocity: 93.50 cm/s MV E/A ratio:  0.96 Jenkins Rouge MD Electronically signed by Jenkins Rouge MD Signature Date/Time: 12/26/2022/3:01:11 PM    Final    CT Renal Stone Study  Result Date: 12/25/2022 CLINICAL DATA:  Abdominal and flank pain, right-sided. EXAM: CT ABDOMEN AND PELVIS WITHOUT CONTRAST TECHNIQUE: Multidetector CT imaging of the abdomen and pelvis was performed following the standard protocol without IV contrast. RADIATION DOSE REDUCTION: This exam  was performed according to the departmental dose-optimization program which includes automated exposure control, adjustment of the mA and/or kV according to patient size and/or use of iterative reconstruction technique. COMPARISON:  CT abdomen and pelvis 01/12/2022 FINDINGS: Lower chest: No acute abnormality. Hepatobiliary: No focal liver abnormality is seen. No gallstones, gallbladder wall thickening, or biliary dilatation. Pancreas: Unremarkable. No pancreatic ductal dilatation or surrounding inflammatory changes. Spleen: Normal in size without focal abnormality. Adrenals/Urinary Tract: There is no hydronephrosis. There is marked right renal cortical scarring and atrophy, unchanged. There are innumerable cysts and mildly hyperdense lesions in both kidneys similar to the prior study. The largest hyperdense lesion is in the right kidney measuring 5.7 cm. Additionally there are nonobstructing right renal calculi measuring up to 13 mm. The adrenal glands and bladder are within normal limits. Stomach/Bowel: No evidence of bowel wall thickening,  distention, or inflammatory changes. There is sigmoid colon diverticulosis. The appendix is not visualized. There is a large amount of stool throughout the colon. There is a small hiatal hernia. The stomach is otherwise within normal limits. Vascular/Lymphatic: Aortic atherosclerosis. No enlarged abdominal or pelvic lymph nodes. Reproductive: Prostate gland is small or surgically absent. Other: There is intramuscular right retroperitoneal hematoma. The right psoas muscle is enlarged measuring up to 8.0 x 8.0 cm image 2/42. There is some mild fluid and stranding tracking along the right retroperitoneum as well. There are small fat containing inguinal hernias, left greater than right. There is no ascites or focal abdominal wall hernia. Musculoskeletal: Again seen are scattered sclerotic osseous lesions. Similar mildly increased in size. Multilevel degenerative changes affect the spine. IMPRESSION: 1. Right retroperitoneal hematoma. 2. Stable right renal cortical scarring and atrophy. 3. Nonobstructing right renal calculi. 4. Colonic diverticulosis. 5. Osseous metastatic disease, mildly increased. 6. Right Bosniak II benign renal cyst measuring 5.7 cm. No follow-up imaging is recommended. JACR 2018 Feb; 264-273, Management of the Incidental Renal Mass on CT, [employer] 2021; 814-848, Bosniak Classification of Cystic Renal Masses, Version 2019. Aortic Atherosclerosis (ICD10-I70.0). Electronically Signed   By: Ronney Asters M.D.   On: 12/25/2022 19:33   DG Lumbar Spine Complete  Result Date: 12/25/2022 CLINICAL DATA:  Pain.  No reported history of injury. EXAM: LUMBAR SPINE - COMPLETE 4+ VIEW COMPARISON:  CT evaluation of January 12, 2022 FINDINGS: EKG leads project over the abdomen. Large RIGHT renal calculus incidentally noted not substantially changed compared to previous imaging measuring approximately 16 mm in the area of the lower pole the RIGHT kidney. Five lumbar type vertebral bodies. Degenerative  changes throughout the lumbar spine greatest at L4-5 and L5-S1 with grade 1 anterolisthesis of L4 on L5 and L5 on S1 similar to imaging from February 2023. Mild-to-moderate degenerative changes elsewhere in the lumbar spine. Subtle retrolisthesis of L1 on L2 also similar to previous imaging. IMPRESSION: Degenerative changes throughout the spine greatest at L4-5 and L5-S1 associated with anterolisthesis at these levels, not changed since previous imaging. Large RIGHT renal calculus.  Correlate with signs of renal colic. Electronically Signed   By: Zetta Bills M.D.   On: 12/25/2022 18:14   DG Hip Unilat With Pelvis 2-3 Views Right  Result Date: 12/25/2022 CLINICAL DATA:  Pain EXAM: DG HIP (WITH OR WITHOUT PELVIS) 3V RIGHT COMPARISON:  None Available. FINDINGS: There is no evidence of hip fracture or dislocation. There is no evidence of arthropathy or other focal bone abnormality. Osteopenia. Surgical clips are seen in the pelvis. There is a sclerotic focus along the right supra-acetabular region. This  is increased from prior CT scan August 2022. There is also a sclerotic focus along the right inferior pubic ramus and some subtle areas elsewhere. History of prostate cancer. Overlapping cardiac leads. IMPRESSION: 1. No acute fracture or dislocation. 2. Osteopenia. 3. Sclerotic foci along the right supra-acetabular region and right inferior pubic ramus. These are increased from prior CT scan. History of prostate cancer. Known sclerotic bone metastases. Please correlate with prior workup. No fracture or dislocation. Electronically Signed   By: Jill Side M.D.   On: 12/25/2022 18:13    Labs: BNP (last 3 results) No results for input(s): "BNP" in the last 8760 hours. Basic Metabolic Panel: Recent Labs  Lab 12/25/22 1950 12/26/22 0515 12/27/22 0451  NA 139 142 139  K 4.3 3.8 3.6  CL 108 107 103  CO2 '24 25 26  '$ GLUCOSE 143* 120* 111*  BUN 34* 30* 28*  CREATININE 1.55* 1.53* 1.77*  CALCIUM 8.5* 8.5*  8.3*  MG  --  2.2  --    Liver Function Tests: Recent Labs  Lab 12/25/22 1950 12/26/22 0515  AST 18 18  ALT 15 15  ALKPHOS 68 64  BILITOT 0.5 0.8  PROT 6.6 6.0*  ALBUMIN 3.5 3.4*   No results for input(s): "LIPASE", "AMYLASE" in the last 168 hours. No results for input(s): "AMMONIA" in the last 168 hours. CBC: Recent Labs  Lab 12/25/22 1950 12/26/22 0515 12/26/22 1840 12/27/22 0451  WBC 12.5* 12.8*  --  10.1  NEUTROABS 10.8* 9.2*  --   --   HGB 12.5* 12.3* 12.4* 11.6*  HCT 38.7* 38.2* 38.9* 36.2*  MCV 96.8 99.7  --  100.0  PLT 293 271  --  248      Component Value Date/Time   COLORURINE YELLOW 07/10/2021 2330   APPEARANCEUR HAZY (A) 07/10/2021 2330   LABSPEC 1.019 07/10/2021 2330   PHURINE 6.0 07/10/2021 2330   GLUCOSEU NEGATIVE 07/10/2021 2330   HGBUR NEGATIVE 07/10/2021 2330   BILIRUBINUR NEGATIVE 07/10/2021 2330   KETONESUR NEGATIVE 07/10/2021 2330   PROTEINUR NEGATIVE 07/10/2021 2330   NITRITE NEGATIVE 07/10/2021 2330   LEUKOCYTESUR LARGE (A) 07/10/2021 2330   Sepsis Labs Recent Labs  Lab 12/25/22 1950 12/26/22 0515 12/27/22 0451  WBC 12.5* 12.8* 10.1   Microbiology No results found for this or any previous visit (from the past 240 hour(s)).   Time coordinating discharge: 25 minutes  SIGNED: Antonieta Pert, MD  [facility] 12/27/2022, 9:58 AM  If 7PM-7AM, please contact night-coverage [email or web address]

## 2022-12-27 NOTE — TOC Transition Note (Signed)
Transition of Care [facility]) - CM/SW Discharge Note   Patient Details  Name: Matthew [name]. MRN: [id number] Date of Birth: [birth year]  [facility] CM/SW Contact:  Dessa Phi, RN Phone Number: 12/27/2022, 10:34 AM   Clinical Narrative: No further CM needs.      Final next level of care: OP Rehab Barriers to Discharge: No Barriers Identified   Patient Goals and CMS Choice CMS Medicare.gov Compare Post Acute Care list provided to:: Patient Choice offered to / list presented to : Patient  Discharge Placement                         Discharge Plan and Services Additional resources added to the After Visit Summary for     Discharge Planning Services: CM Consult                                 Social Determinants of Health (SDOH) Interventions SDOH Screenings   Food Insecurity: No Food Insecurity (12/25/2022)  Housing: Low Risk  (12/25/2022)  Transportation Needs: No Transportation Needs (12/25/2022)  Utilities: Not At Risk (12/25/2022)  Tobacco Use: Low Risk  (12/26/2022)     Readmission Risk Interventions     No data to display

## 2022-12-27 NOTE — TOC Progression Note (Signed)
Transition of Care (TOC) - Progression Note    Patient Details  Name: Matthew [name]. MRN: [id number] Date of Birth: 02-01-1941  Transition of Care [facility]) CM/SW Contact  Shelvy Perazzo, Juliann Pulse, RN Phone Number: 12/27/2022, 10:33 AM  Clinical Narrative:  otpt PT referral sent to PCP-spouse agree. No further CM needs.     Expected Discharge Plan: OP Rehab Barriers to Discharge: Continued Medical Work up  Expected Discharge Plan and Services   Discharge Planning Services: CM Consult   Living arrangements for the past 2 months: Single Family Home Expected Discharge Date: 12/27/22                                     Social Determinants of Health (SDOH) Interventions SDOH Screenings   Food Insecurity: No Food Insecurity (12/25/2022)  Housing: Low Risk  (12/25/2022)  Transportation Needs: No Transportation Needs (12/25/2022)  Utilities: Not At Risk (12/25/2022)  Tobacco Use: Low Risk  (12/26/2022)    Readmission Risk Interventions     No data to display

## 2022-12-28 ENCOUNTER — Ambulatory Visit: Payer: Medicare PPO

## 2022-12-28 DIAGNOSIS — I455 Other specified heart block: Secondary | ICD-10-CM | POA: Diagnosis not present

## 2022-12-31 ENCOUNTER — Telehealth: Payer: Self-pay

## 2022-12-31 NOTE — Telephone Encounter (Signed)
Patient called to let us know that he has a PET scan on Friday 01/04/23, and just had a ZIO monitor placed last week. Per Dr. Virgina Jock, patient is to remove the monitor on Friday before the scan and go ahead and ship it back. Patient agree and will follow these instructions.

## 2023-01-01 NOTE — Therapy (Signed)
OUTPATIENT PHYSICAL THERAPY LOWER EXTREMITY EVALUATION   Patient Name: Matthew [name]. MRN: [id number] DOB:12/27/1940, 82 y.o., male Today's Date: 01/02/2023  END OF SESSION:  PT End of Session - 01/02/23 0845     Visit Number 1    Date for PT Re-Evaluation 02/13/23    Authorization Type HUMA[employer] MCR    Authorization Time Period 01/02/23-02/13/23    Authorization - Number of Visits 12    Progress Note Due on Visit 10    PT Start Time 0802    PT Stop Time 0845    PT Time Calculation (min) 43 min    Activity Tolerance Patient tolerated treatment well    Behavior During Therapy WFL for tasks assessed/performed             Past Medical History:  Diagnosis Date   Arthritis    Barrett's esophagus    Cancer ([place]) 2004   prostate    Dysrhythmia    GERD (gastroesophageal reflux disease)    pt denies   History of kidney stones    Hyperlipidemia    Hypertension    Pneumonia    In high school   Prostate cancer [facility])    Past Surgical History:  Procedure Laterality Date   CARDIOVERSION N/A 08/08/2021   Procedure: CARDIOVERSION;  Surgeon: Nigel Mormon, MD;  Location: [place];  Service: Cardiovascular;  Laterality: N/A;   COLONOSCOPY WITH PROPOFOL N/A 01/09/2016   Procedure: COLONOSCOPY WITH PROPOFOL;  Surgeon: Garlan Fair, MD;  [facility];  Service: Endoscopy;  Laterality: N/A;   CYSTOSCOPY     x 1   CYSTOSCOPY WITH RETROGRADE PYELOGRAM, URETEROSCOPY AND STENT PLACEMENT Left 03/30/2022   Procedure: CYSTOSCOPY WITH LEFT RETROGRADE PYELOGRAM, URETEROSCOPY AND STENT PLACEMENT;  Surgeon: Irine Seal, MD;  [facility];  Service: Urology;  Laterality: Left;  1 HR FOR CASE   ESOPHAGOGASTRODUODENOSCOPY (EGD) WITH PROPOFOL N/A 01/09/2016   Procedure: ESOPHAGOGASTRODUODENOSCOPY (EGD) WITH PROPOFOL;  Surgeon: Garlan Fair, MD;  [facility];  Service: Endoscopy;  Laterality: N/A;   EXTRACORPOREAL SHOCK WAVE LITHOTRIPSY Left 01/25/2022   Procedure:  LEFT EXTRACORPOREAL SHOCK WAVE LITHOTRIPSY (ESWL);  Surgeon: Vira Agar, MD;  Location: [facility];  Service: Urology;  Laterality: Left;   EYE SURGERY     bil cataract   KNEE ARTHROSCOPY Left 25 yrs ago   PARATHYROIDECTOMY N/A 02/25/2020   Procedure: NECK EXPLORATION WITH PARATHYROIDECTOMY;  Surgeon: Armandina Gemma, MD;  [facility];  Service: General;  Laterality: N/A;   PROSTATECTOMY  2004   Patient Active Problem List   Diagnosis Date Noted   Chronic kidney disease, stage 3a ([place]) - Baseline Scr 1.5-1.9 12/26/2022   Gastro-esophageal reflux disease with esophagitis 12/26/2022   Paroxysmal atrial fibrillation ([place]) 12/26/2022   Retroperitoneal hematoma 12/25/2022   Typical atrial flutter ([place]) 07/18/2021   Chronic heart failure with preserved ejection fraction ([place]) 07/18/2021   Essential hypertension 07/11/2021   Malignant neoplasm of prostate metastatic to bone ([place]) 04/25/2021   Primary hyperparathyroidism ([place]) 02/25/2020    PCP: Wenda Low, MD   REFERRING PROVIDER: Wenda Low, MD   REFERRING DIAG: [phone] (ICD-10-CM) - Retroperitoneal hematoma   THERAPY DIAG:  Pain in right hip  Muscle weakness (generalized)  Other muscle spasm  Other low back pain  Unsteadiness on feet  Rationale for Evaluation and Treatment: Rehabilitation  ONSET DATE: 12/25/22  SUBJECTIVE:   SUBJECTIVE STATEMENT: Was having pain in his groin 3 weeks prior to DOI. As  he stepped over his large dog he had severe pain when his foot hit the floor. It is a whole lot better now, but still having stiffness down R thigh. Neither knee is great. PET scan is this Friday. Has heart monitor right now. Uses SPC intermittently.  Wife: Stanton Kidney  PERTINENT HISTORY: Metatastic prostate CA to bone, HTN, Afib, he has had radiation on R hip 2 years ago, bil knee arthritis.  PAIN:  Are you having pain? Yes: NPRS scale: 1-2/10 Pain location: R post hip Pain description:  pulling Aggravating factors: sit to stand, getting into bed Relieving factors: lying flat on back  PRECAUTIONS: None  WEIGHT BEARING RESTRICTIONS: No  FALLS:  Has patient fallen in last 6 months? No  LIVING ENVIRONMENT: Lives with: lives with their spouse Lives in: House/apartment Stairs: Yes: External: 5 steps; can reach both Has following equipment at home: Single point cane and Walker - 4 wheeled  OCCUPATION: desk work  PLOF: Independent  PATIENT GOALS: get back into exercising and get some stretching and strength  NEXT MD VISIT: After the PET Scan  OBJECTIVE:   DIAGNOSTIC FINDINGS:  CT renal stone was done which showed a retroperitoneal intramuscular hematoma of the right psoas muscle measuring 8 x 8 cm   PATIENT SURVEYS:  ABC scale TBD LEFS  32 / 80 = 40.0 %  COGNITION: Overall cognitive status: Within functional limits for tasks assessed     SENSATION: WFL   EDEMA:  Reports swelling bil feet on meds  MUSCLE LENGTH: [id number] R, mod L Quads: bil tightness ITB: mod R Piriformis: marked R, mild L Hip Flexors: bil tightness Heelcords:   POSTURE: rounded shoulders, forward head, and increased thoracic kyphosis  PALPATION: Palpation: TTP at R QL. Increased tissue tension in R lumbar   LOWER EXTREMITY MMT  MMT Right eval Left eval  Hip flexion 3 5  Hip extension 5 4+  Hip abduction 5 4+  Hip adduction 5 5  Hip internal rotation    Hip external rotation    Knee flexion 5 5  Knee extension 5 5  Ankle dorsiflexion 5 5   (Blank rows = not tested)  LUMBAR ROM: flex WFL, ext mild pain in mid back WFL, bil rot 75% left tighter, bil SB WFL feels on right with left SB  LOWER EXTREMITY ROM:  WFL  FUNCTIONAL TESTS:  5 times sit to stand: 14.98 sec uses bil hands lightly Berg Balance Scale: TBD Dynamic Gait Index: TBD  GAIT: Distance walked: 40 ft Assistive device utilized: Single point cane Level of assistance: Modified independence Comments:  decreased heel strike, step length R   TODAY'S TREATMENT:                                                                                                                              DATE:   01/02/23 See pt ed  PATIENT EDUCATION:  Education details: PT eval findings, anticipated POC, initial HEP, and  role of DN  Person educated: Patient and Spouse Education method: Explanation, Demonstration, Tactile cues, Verbal cues, and Handouts Education comprehension: verbalized understanding, returned demonstration, and verbal cues required  HOME EXERCISE PROGRAM: Access Code: [email or web address] URL: [email or web address] Date: 01/02/2023 Prepared by: Almyra Free  Exercises - Supine Bridge  - 2 x daily - 7 x weekly - 1-3 sets - 10 reps - Supine Lower Trunk Rotation  - 2 x daily - 7 x weekly - 1 sets - 5 reps - 10 sec hold - Supine Quadriceps Stretch with Strap on Table (Mirrored)  - 2 x daily - 7 x weekly - 1 sets - 3 reps - 30-60 sec hold - Prone Quadriceps Stretch with Strap  - 2-3 x daily - 7 x weekly - 1 sets - 3 reps - 60 sec hold - Seated Hamstring Stretch  - 2 x daily - 7 x weekly - 1 sets - 3 reps - 30-60 sec hold - Sit to Stand  - 3 x daily - 7 x weekly - 1-2 sets - 10 reps  ASSESSMENT:  CLINICAL IMPRESSION: Gal Feldhaus. is an 82 y.o. male who was seen today for physical therapy evaluation and treatment for right hip pain. He presents with mild low back pain as well upon testing. He has marked weakness in his right hip flexor affecting ADLs such as stairs, getting into and out of car and bed, and gait. He has marked LE flexibility defiicits bil as well. Patient also reports balance deficits and utilizes a SPC in the community and intermittently at home. Due to time constraints balance assessment will be completed next visit and goals set. Talib will benefit from skilled PT to address these deficits.  OBJECTIVE IMPAIRMENTS: Abnormal gait, decreased ROM, decreased strength, increased  fascial restrictions, increased muscle spasms, impaired flexibility, and pain.   ACTIVITY LIMITATIONS: squatting, stairs, bed mobility, dressing, and locomotion level  PARTICIPATION LIMITATIONS: driving  PERSONAL FACTORS: 3+ comorbidities: Metatastic prostate CA to bone, HTN, Afib, he has had radiation on R hip 2 years ago, bil knee arthritis.  are also affecting patient's functional outcome.   REHAB POTENTIAL: Excellent  CLINICAL DECISION MAKING: Stable/uncomplicated  EVALUATION COMPLEXITY: Low   GOALS: Goals reviewed with patient? Yes  SHORT TERM GOALS: Target date: 01/16/2023 (Remove Blue Hyperlink)  Patient will be independent with initial HEP. Baseline:  Goal status: INITIAL  2.  Balance assessment completed including review of ABC Scale, TUG and BERG Baseline:  Goal status: INITIAL    LONG TERM GOALS: Target date: 02/13/2023  Patient will be independent with advanced/ongoing HEP to improve outcomes and carryover.  Baseline:  Goal status: INITIAL  2.  Patient will report at least 75% improvement in R hip pain to improve QOL. Baseline:  Goal status: INITIAL  3.  Patient will demonstrate improved functional LE strength as demonstrated by ability to perform 5xSTS without UE support in <= 12 sec. Baseline: 14.98 sec uses bil hands lightly Goal status: INITIAL  4.  Patient will be able to ambulate 600' and negotiate curbs with LRAD and normal gait pattern without increased pain to access community.  Baseline:  Goal status: INITIAL  5.  Patient will report >= 41/80 on LEFS to demonstrate improved functional ability. Baseline: ABC scale TBD LEFS  32 / 80 = 40.0 % Goal status: INITIAL  6.  Patient will demonstrate at least 19/24 on DGI to decrease risk of falls. Baseline: TBD Goal status: INITIAL     PLAN:  PT FREQUENCY:  2x/week  PT DURATION: 6 weeks  PLANNED INTERVENTIONS: Therapeutic exercises, Therapeutic activity, Neuromuscular re-education, Balance  training, Gait training, Patient/Family education, Self Care, Joint mobilization, Stair training, Dry Needling, Electrical stimulation, Spinal mobilization, Cryotherapy, Moist heat, Taping, Ionotophoresis '4mg'$ /ml Dexamethasone, Manual therapy, and Re-evaluation  PLAN FOR NEXT SESSION: Review ABC scale, assess DGI and BERG, review HEP and add ITB stretch, manual therapy/DN to R quads, lumbar, ITB as indicated.   Layli Capshaw, PT 01/02/2023, 8:43 PM

## 2023-01-02 ENCOUNTER — Encounter: Payer: Self-pay | Admitting: Physical Therapy

## 2023-01-02 ENCOUNTER — Ambulatory Visit: Payer: Medicare PPO | Attending: Internal Medicine | Admitting: Physical Therapy

## 2023-01-02 ENCOUNTER — Other Ambulatory Visit: Payer: Self-pay

## 2023-01-02 DIAGNOSIS — M5459 Other low back pain: Secondary | ICD-10-CM

## 2023-01-02 DIAGNOSIS — R2681 Unsteadiness on feet: Secondary | ICD-10-CM | POA: Diagnosis not present

## 2023-01-02 DIAGNOSIS — M6281 Muscle weakness (generalized): Secondary | ICD-10-CM | POA: Diagnosis not present

## 2023-01-02 DIAGNOSIS — M62838 Other muscle spasm: Secondary | ICD-10-CM | POA: Insufficient documentation

## 2023-01-02 DIAGNOSIS — M25551 Pain in right hip: Secondary | ICD-10-CM | POA: Diagnosis not present

## 2023-01-02 DIAGNOSIS — K683 Retroperitoneal hematoma: Secondary | ICD-10-CM | POA: Insufficient documentation

## 2023-01-04 ENCOUNTER — Ambulatory Visit (HOSPITAL_COMMUNITY)
Admission: RE | Admit: 2023-01-04 | Discharge: 2023-01-04 | Disposition: A | Discharge status: Home / Self Care | Payer: Medicare PPO | Source: Ambulatory Visit | Attending: Urology | Admitting: Urology

## 2023-01-04 DIAGNOSIS — C7951 Secondary malignant neoplasm of bone: Secondary | ICD-10-CM | POA: Diagnosis not present

## 2023-01-04 DIAGNOSIS — K449 Diaphragmatic hernia without obstruction or gangrene: Secondary | ICD-10-CM | POA: Diagnosis not present

## 2023-01-04 DIAGNOSIS — R972 Elevated prostate specific antigen [PSA]: Secondary | ICD-10-CM | POA: Insufficient documentation

## 2023-01-04 MED ORDER — PIFLIFOLASTAT F 18 (PYLARIFY) INJECTION
9.0000 | Freq: Once | INTRAVENOUS | Status: AC
  Administered 2023-01-04: 9.4 via INTRAVENOUS

## 2023-01-08 ENCOUNTER — Ambulatory Visit: Payer: Medicare PPO | Attending: Internal Medicine

## 2023-01-08 DIAGNOSIS — M5459 Other low back pain: Secondary | ICD-10-CM | POA: Insufficient documentation

## 2023-01-08 DIAGNOSIS — M6281 Muscle weakness (generalized): Secondary | ICD-10-CM

## 2023-01-08 DIAGNOSIS — M62838 Other muscle spasm: Secondary | ICD-10-CM | POA: Diagnosis not present

## 2023-01-08 DIAGNOSIS — R2681 Unsteadiness on feet: Secondary | ICD-10-CM | POA: Insufficient documentation

## 2023-01-08 DIAGNOSIS — M25551 Pain in right hip: Secondary | ICD-10-CM | POA: Diagnosis not present

## 2023-01-08 NOTE — Therapy (Signed)
OUTPATIENT PHYSICAL THERAPY TREATMENT   Patient Name: Matthew [name]. MRN: [id number] DOB:September 28, 1941, 82 y.o., male Today's Date: 01/08/2023  END OF SESSION:  PT End of Session - 01/08/23 0942     Visit Number 2    Date for PT Re-Evaluation 02/13/23    Authorization Type HU[employer] MCR    Authorization Time Period 01/02/23-02/13/23    Authorization - Number of Visits 12    Progress Note Due on Visit 10    PT Start Time 0846    PT Stop Time 0932    PT Time Calculation (min) 46 min    Activity Tolerance Patient tolerated treatment well    Behavior During Therapy [facility] for tasks assessed/performed              Past Medical History:  Diagnosis Date   Arthritis    Barrett's esophagus    Cancer ([place]) 2004   prostate    Dysrhythmia    GERD (gastroesophageal reflux disease)    pt denies   History of kidney stones    Hyperlipidemia    Hypertension    Pneumonia    In high school   Prostate cancer [facility])    Past Surgical History:  Procedure Laterality Date   CARDIOVERSION N/A 08/08/2021   Procedure: CARDIOVERSION;  Surgeon: Nigel Mormon, MD;  Location: [place];  Service: Cardiovascular;  Laterality: N/A;   COLONOSCOPY WITH PROPOFOL N/A 01/09/2016   Procedure: COLONOSCOPY WITH PROPOFOL;  Surgeon: Garlan Fair, MD;  [facility];  Service: Endoscopy;  Laterality: N/A;   CYSTOSCOPY     x 1   CYSTOSCOPY WITH RETROGRADE PYELOGRAM, URETEROSCOPY AND STENT PLACEMENT Left 03/30/2022   Procedure: CYSTOSCOPY WITH LEFT RETROGRADE PYELOGRAM, URETEROSCOPY AND STENT PLACEMENT;  Surgeon: Irine Seal, MD;  [facility];  Service: Urology;  Laterality: Left;  1 HR FOR CASE   ESOPHAGOGASTRODUODENOSCOPY (EGD) WITH PROPOFOL N/A 01/09/2016   Procedure: ESOPHAGOGASTRODUODENOSCOPY (EGD) WITH PROPOFOL;  Surgeon: Garlan Fair, MD;  [facility];  Service: Endoscopy;  Laterality: N/A;   EXTRACORPOREAL SHOCK WAVE LITHOTRIPSY Left 01/25/2022   Procedure: LEFT  EXTRACORPOREAL SHOCK WAVE LITHOTRIPSY (ESWL);  Surgeon: Vira Agar, MD;  Location: [facility];  Service: Urology;  Laterality: Left;   EYE SURGERY     bil cataract   KNEE ARTHROSCOPY Left 25 yrs ago   PARATHYROIDECTOMY N/A 02/25/2020   Procedure: NECK EXPLORATION WITH PARATHYROIDECTOMY;  Surgeon: Armandina Gemma, MD;  [facility];  Service: General;  Laterality: N/A;   PROSTATECTOMY  2004   Patient Active Problem List   Diagnosis Date Noted   Chronic kidney disease, stage 3a ([place]) - Baseline Scr 1.5-1.9 12/26/2022   Gastro-esophageal reflux disease with esophagitis 12/26/2022   Paroxysmal atrial fibrillation ([place]) 12/26/2022   Retroperitoneal hematoma 12/25/2022   Typical atrial flutter ([place]) 07/18/2021   Chronic heart failure with preserved ejection fraction ([place]) 07/18/2021   Essential hypertension 07/11/2021   Malignant neoplasm of prostate metastatic to bone ([place]) 04/25/2021   Primary hyperparathyroidism ([place]) 02/25/2020    PCP: Wenda Low, MD   REFERRING PROVIDER: Wenda Low, MD   REFERRING DIAG: [phone] (ICD-10-CM) - Retroperitoneal hematoma   THERAPY DIAG:  Pain in right hip  Muscle weakness (generalized)  Other muscle spasm  Other low back pain  Unsteadiness on feet  Rationale for Evaluation and Treatment: Rehabilitation  ONSET DATE: 12/25/22  SUBJECTIVE:   SUBJECTIVE STATEMENT: "Soreness in feet and lower leg today. Walked the dogs earlier."  Wife:  [name]  PERTINENT HISTORY: Metatastic prostate CA to bone, HTN, Afib, he has had radiation on R hip 2 years ago, bil knee arthritis.  PAIN:  Are you having pain? No  PRECAUTIONS: None  WEIGHT BEARING RESTRICTIONS: No  FALLS:  Has patient fallen in last 6 months? No  LIVING ENVIRONMENT: Lives with: lives with their spouse Lives in: House/apartment Stairs: Yes: External: 5 steps; can reach both Has following equipment at home: Single point cane and Walker - 4  wheeled  OCCUPATION: desk work  PLOF: Independent  PATIENT GOALS: get back into exercising and get some stretching and strength  NEXT MD VISIT: After the PET Scan  OBJECTIVE:   DIAGNOSTIC FINDINGS:  CT renal stone was done which showed a retroperitoneal intramuscular hematoma of the right psoas muscle measuring 8 x 8 cm   PATIENT SURVEYS:  ABC scale TBD LEFS  32 / 80 = 40.0 %  COGNITION: Overall cognitive status: Within functional limits for tasks assessed     SENSATION: WFL   EDEMA:  Reports swelling bil feet on meds  MUSCLE LENGTH: [id number] R, mod L Quads: bil tightness ITB: mod R Piriformis: marked R, mild L Hip Flexors: bil tightness Heelcords:   POSTURE: rounded shoulders, forward head, and increased thoracic kyphosis  PALPATION: Palpation: TTP at R QL. Increased tissue tension in R lumbar   LOWER EXTREMITY MMT  MMT Right eval Left eval  Hip flexion 3 5  Hip extension 5 4+  Hip abduction 5 4+  Hip adduction 5 5  Hip internal rotation    Hip external rotation    Knee flexion 5 5  Knee extension 5 5  Ankle dorsiflexion 5 5   (Blank rows = not tested)  LUMBAR ROM: flex WFL, ext mild pain in mid back WFL, bil rot 75% left tighter, bil SB WFL feels on right with left SB  LOWER EXTREMITY ROM:  WFL  FUNCTIONAL TESTS:  5 times sit to stand: 14.98 sec uses bil hands lightly Berg Balance Scale: 49/56 Dynamic Gait Index: 19/24  GAIT: Distance walked: 40 ft Assistive device utilized: Single point cane Level of assistance: Modified independence Comments: decreased heel strike, step length R   TODAY'S TREATMENT:                                                                                                                              DATE:    01/08/23 Therapeutic Activity: to improve functional performance. BERG balance scale assessed DGI assessed ABC scale: 880/1600 - 55%  Therapeutic Exercise: to improve strength and mobility.  Demo, verbal  and tactile cues throughout for technique.  Seated hamstring stretch x 30 sec bil STS x 10 Supine quad and hip flexor stretch reviewed Standing hip abduction x 10 bil Standing marches x 10 bil Standing heel raise x 10 bil Standing toe raise x 10 bil  01/02/23 See pt ed  PATIENT EDUCATION:  Education details: HEP update  Person  educated: Patient and Spouse Education method: Explanation, Demonstration, Tactile cues, Verbal cues, and Handouts Education comprehension: verbalized understanding, returned demonstration, and verbal cues required  HOME EXERCISE PROGRAM: Access Code: [email or web address] URL: [email or web address] Date: 01/02/2023 Prepared by: Almyra Free  Exercises - Supine Bridge  - 2 x daily - 7 x weekly - 1-3 sets - 10 reps - Supine Lower Trunk Rotation  - 2 x daily - 7 x weekly - 1 sets - 5 reps - 10 sec hold - Supine Quadriceps Stretch with Strap on Table (Mirrored)  - 2 x daily - 7 x weekly - 1 sets - 3 reps - 30-60 sec hold - Prone Quadriceps Stretch with Strap  - 2-3 x daily - 7 x weekly - 1 sets - 3 reps - 60 sec hold - Seated Hamstring Stretch  - 2 x daily - 7 x weekly - 1 sets - 3 reps - 30-60 sec hold - Sit to Stand  - 3 x daily - 7 x weekly - 1-2 sets - 10 reps  ASSESSMENT:  CLINICAL IMPRESSION: Performed BERG balance test, DGI, and reviewed ABC scale with patient to assess balance and confidence with ADLs. Based on scores he shows low risk for falls, having most difficulty with stepping over obstacles and with head turns during gait. Added exercises to work on hip strengthening. He had difficulty lying prone doing quad stretch, so we switched to supine stretch to focus on at home.    OBJECTIVE IMPAIRMENTS: Abnormal gait, decreased ROM, decreased strength, increased fascial restrictions, increased muscle spasms, impaired flexibility, and pain.   ACTIVITY LIMITATIONS: squatting, stairs, bed mobility, dressing, and locomotion level  PARTICIPATION LIMITATIONS:  driving  PERSONAL FACTORS: 3+ comorbidities: Metatastic prostate CA to bone, HTN, Afib, he has had radiation on R hip 2 years ago, bil knee arthritis.  are also affecting patient's functional outcome.   REHAB POTENTIAL: Excellent  CLINICAL DECISION MAKING: Stable/uncomplicated  EVALUATION COMPLEXITY: Low   GOALS: Goals reviewed with patient? Yes  SHORT TERM GOALS: Target date: 01/16/2023 (Remove Blue Hyperlink)  Patient will be independent with initial HEP. Baseline:  Goal status: IN PROGRESS  2.  Balance assessment completed including review of ABC Scale, TUG and BERG Baseline:  Goal status: MET    LONG TERM GOALS: Target date: 02/13/2023  Patient will be independent with advanced/ongoing HEP to improve outcomes and carryover.  Baseline:  Goal status: IN PROGRESS  2.  Patient will report at least 75% improvement in R hip pain to improve QOL. Baseline:  Goal status: IN PROGRESS  3.  Patient will demonstrate improved functional LE strength as demonstrated by ability to perform 5xSTS without UE support in <= 12 sec. Baseline: 14.98 sec uses bil hands lightly Goal status: IN PROGRESS  4.  Patient will be able to ambulate 600' and negotiate curbs with LRAD and normal gait pattern without increased pain to access community.  Baseline:  Goal status: IN PROGRESS  5.  Patient will report >= 41/80 on LEFS to demonstrate improved functional ability. Baseline: ABC scale TBD LEFS  32 / 80 = 40.0 % Goal status: IN PROGRESS  6.  Patient will demonstrate at least 19/24 on DGI to decrease risk of falls. Baseline: TBD Goal status: IN PROGRESS     PLAN:  PT FREQUENCY: 2x/week  PT DURATION: 6 weeks  PLANNED INTERVENTIONS: Therapeutic exercises, Therapeutic activity, Neuromuscular re-education, Balance training, Gait training, Patient/Family education, Self Care, Joint mobilization, Stair training, Dry Needling, Electrical stimulation, Spinal mobilization, Cryotherapy, Moist  heat,  Taping, Ionotophoresis '4mg'$ /ml Dexamethasone, Manual therapy, and Re-evaluation  PLAN FOR NEXT SESSION: review HEP and add ITB stretch, manual therapy/DN to R quads, lumbar, ITB as indicated. Work on balance, stepping over obstacles, narrow BOS, gait with head turns   Artist Pais, PTA 01/08/2023, 10:45 AM

## 2023-01-11 ENCOUNTER — Ambulatory Visit: Payer: Medicare PPO | Admitting: Physical Therapy

## 2023-01-11 ENCOUNTER — Encounter: Payer: Self-pay | Admitting: Physical Therapy

## 2023-01-11 DIAGNOSIS — M5459 Other low back pain: Secondary | ICD-10-CM | POA: Diagnosis not present

## 2023-01-11 DIAGNOSIS — M6281 Muscle weakness (generalized): Secondary | ICD-10-CM

## 2023-01-11 DIAGNOSIS — M25551 Pain in right hip: Secondary | ICD-10-CM | POA: Diagnosis not present

## 2023-01-11 DIAGNOSIS — R2681 Unsteadiness on feet: Secondary | ICD-10-CM | POA: Diagnosis not present

## 2023-01-11 DIAGNOSIS — M62838 Other muscle spasm: Secondary | ICD-10-CM

## 2023-01-11 NOTE — Therapy (Signed)
OUTPATIENT PHYSICAL THERAPY TREATMENT   Patient Name: Matthew [name]. MRN: [id number] DOB:05/09/1941, 82 y.o., male Today's Date: 01/11/2023  END OF SESSION:  PT End of Session - 01/11/23 0809     Visit Number 3    Date for PT Re-Evaluation 02/13/23    Authorization Type HUMANA MCR    Authorization Time Period 01/02/23-02/13/23    Authorization - Number of Visits 12    Progress Note Due on Visit 10    PT Start Time 0806    PT Stop Time 0852    PT Time Calculation (min) 46 min    Activity Tolerance Patient tolerated treatment well    Behavior During Therapy [facility] for tasks assessed/performed              Past Medical History:  Diagnosis Date   Arthritis    Barrett's esophagus    Cancer ([place]) 2004   prostate    Dysrhythmia    GERD (gastroesophageal reflux disease)    pt denies   History of kidney stones    Hyperlipidemia    Hypertension    Pneumonia    In high school   Prostate cancer [facility])    Past Surgical History:  Procedure Laterality Date   CARDIOVERSION N/A 08/08/2021   Procedure: CARDIOVERSION;  Surgeon: Nigel Mormon, MD;  Location: [place];  Service: Cardiovascular;  Laterality: N/A;   COLONOSCOPY WITH PROPOFOL N/A 01/09/2016   Procedure: COLONOSCOPY WITH PROPOFOL;  Surgeon: Garlan Fair, MD;  [facility];  Service: Endoscopy;  Laterality: N/A;   CYSTOSCOPY     x 1   CYSTOSCOPY WITH RETROGRADE PYELOGRAM, URETEROSCOPY AND STENT PLACEMENT Left 03/30/2022   Procedure: CYSTOSCOPY WITH LEFT RETROGRADE PYELOGRAM, URETEROSCOPY AND STENT PLACEMENT;  Surgeon: Irine Seal, MD;  [facility];  Service: Urology;  Laterality: Left;  1 HR FOR CASE   ESOPHAGOGASTRODUODENOSCOPY (EGD) WITH PROPOFOL N/A 01/09/2016   Procedure: ESOPHAGOGASTRODUODENOSCOPY (EGD) WITH PROPOFOL;  Surgeon: Garlan Fair, MD;  [facility];  Service: Endoscopy;  Laterality: N/A;   EXTRACORPOREAL SHOCK WAVE LITHOTRIPSY Left 01/25/2022   Procedure: LEFT  EXTRACORPOREAL SHOCK WAVE LITHOTRIPSY (ESWL);  Surgeon: Vira Agar, MD;  Location: [facility];  Service: Urology;  Laterality: Left;   EYE SURGERY     bil cataract   KNEE ARTHROSCOPY Left 25 yrs ago   PARATHYROIDECTOMY N/A 02/25/2020   Procedure: NECK EXPLORATION WITH PARATHYROIDECTOMY;  Surgeon: Armandina Gemma, MD;  [facility];  Service: General;  Laterality: N/A;   PROSTATECTOMY  2004   Patient Active Problem List   Diagnosis Date Noted   Chronic kidney disease, stage 3a ([place]) - Baseline Scr 1.5-1.9 12/26/2022   Gastro-esophageal reflux disease with esophagitis 12/26/2022   Paroxysmal atrial fibrillation ([place]) 12/26/2022   Retroperitoneal hematoma 12/25/2022   Typical atrial flutter ([place]) 07/18/2021   Chronic heart failure with preserved ejection fraction ([place]) 07/18/2021   Essential hypertension 07/11/2021   Malignant neoplasm of prostate metastatic to bone ([place]) 04/25/2021   Primary hyperparathyroidism ([place]) 02/25/2020    PCP: Wenda Low, MD   REFERRING PROVIDER: Wenda Low, MD   REFERRING DIAG: [phone] (ICD-10-CM) - Retroperitoneal hematoma   THERAPY DIAG:  Pain in right hip  Muscle weakness (generalized)  Other muscle spasm  Other low back pain  Unsteadiness on feet  Rationale for Evaluation and Treatment: Rehabilitation  ONSET DATE: 12/25/22  SUBJECTIVE:   SUBJECTIVE STATEMENT: "I have pain every day, but no more than usual, think its the  circulation in my feet and lower legs."  Denies falls.  PET scan showed new spot, so will be starting radiation treatment next week.   Wife: Stanton Kidney  PERTINENT HISTORY: Metatastic prostate CA to bone, HTN, Afib, he has had radiation on R hip 2 years ago, bil knee arthritis.  PAIN:  Are you having pain? No  PRECAUTIONS: None  WEIGHT BEARING RESTRICTIONS: No  FALLS:  Has patient fallen in last 6 months? No  LIVING ENVIRONMENT: Lives with: lives with their spouse Lives in:  House/apartment Stairs: Yes: External: 5 steps; can reach both Has following equipment at home: Single point cane and Walker - 4 wheeled  OCCUPATION: desk work  PLOF: Independent  PATIENT GOALS: get back into exercising and get some stretching and strength  NEXT MD VISIT: After the PET Scan  OBJECTIVE:   DIAGNOSTIC FINDINGS:  CT renal stone was done which showed a retroperitoneal intramuscular hematoma of the right psoas muscle measuring 8 x 8 cm   PATIENT SURVEYS:  ABC scale 55% (01/08/23) LEFS  32 / 80 = 40.0 %  COGNITION: Overall cognitive status: Within functional limits for tasks assessed     SENSATION: WFL   EDEMA:  Reports swelling bil feet on meds  MUSCLE LENGTH: [id number] R, mod L Quads: bil tightness ITB: mod R Piriformis: marked R, mild L Hip Flexors: bil tightness Heelcords:   POSTURE: rounded shoulders, forward head, and increased thoracic kyphosis  PALPATION: Palpation: TTP at R QL. Increased tissue tension in R lumbar   LOWER EXTREMITY MMT  MMT Right eval Left eval  Hip flexion 3 5  Hip extension 5 4+  Hip abduction 5 4+  Hip adduction 5 5  Hip internal rotation    Hip external rotation    Knee flexion 5 5  Knee extension 5 5  Ankle dorsiflexion 5 5   (Blank rows = not tested)  LUMBAR ROM: flex WFL, ext mild pain in mid back WFL, bil rot 75% left tighter, bil SB WFL feels on right with left SB  LOWER EXTREMITY ROM:  WFL  FUNCTIONAL TESTS:  5 times sit to stand: 14.98 sec uses bil hands lightly Berg Balance Scale: 49/56 Dynamic Gait Index: 19/24  GAIT: Distance walked: 40 ft Assistive device utilized: Single point cane Level of assistance: Modified independence Comments: decreased heel strike, step length R   TODAY'S TREATMENT:                                                                                                                              DATE:   01/11/2023 Therapeutic Exercise: to improve strength and mobility.   Demo, verbal and tactile cues throughout for technique. Nustep L6 x 7 min  Leg extensions 20# 2 x 10  Hamstring curl 20# 2 x 10  Leg Press 25# x 10 - difficulty keeping feet on plate.  Neuromuscular Reeducation: to improve balance and stability. SBA for safety throughout.  On Airex: Eyes  open feet apart x 30 sec  Eyes open feet apart with head nods x 10 Eyes open feet apart with head turns x 10 Eyes closed feet apart x 30 sec  On level surface: Eyes open feet together x 30 sec  Eyes open feet together with head nods x 10 Eyes open feet together with head turns x 10 Eyes closed feet together x 30 sec Eyes closed feet together with head nods x 10 Eyes closed feet together with head turns x 10  Tandem Stance x 30 sec bil    01/08/23 Therapeutic Activity: to improve functional performance. BERG balance scale assessed DGI assessed ABC scale: 880/1600 - 55%  Therapeutic Exercise: to improve strength and mobility.  Demo, verbal and tactile cues throughout for technique.  Seated hamstring stretch x 30 sec bil STS x 10 Supine quad and hip flexor stretch reviewed Standing hip abduction x 10 bil Standing marches x 10 bil Standing heel raise x 10 bil Standing toe raise x 10 bil  01/02/23 See pt ed  PATIENT EDUCATION:  Education details: HEP update  Person educated: Patient Education method: Explanation, Demonstration, Tactile cues, Verbal cues, and Handouts Education comprehension: verbalized understanding and returned demonstration  HOME EXERCISE PROGRAM: Access Code: [email or web address] URL: [email or web address] Date: 01/11/2023 Prepared by: Glenetta Hew  Exercises Added  - Standing Balance in Corner  - 1 x daily - 7 x weekly - Standing Balance in Corner with Eyes Closed  - 1 x daily - 7 x weekly - Tandem Stance in Corner  - 1 x daily - 7 x weekly  ASSESSMENT:  CLINICAL IMPRESSION: [name]. Reported that he continues to not have any R thigh pain, just  lower LE pain due to poor circulation.  Today reviewed exercise equipment as he has this available and enjoys work-outs, which he tolerated well, followed by corner balance exercises.  He was very challenged with compliant surfaces, eyes closed and head movements, so these were added to HEP, to be performed in corner with supervision at home on level surface.  [name]. continues to demonstrate potential for improvement and would benefit from continued skilled therapy to address impairments.     OBJECTIVE IMPAIRMENTS: Abnormal gait, decreased ROM, decreased strength, increased fascial restrictions, increased muscle spasms, impaired flexibility, and pain.   ACTIVITY LIMITATIONS: squatting, stairs, bed mobility, dressing, and locomotion level  PARTICIPATION LIMITATIONS: driving  PERSONAL FACTORS: 3+ comorbidities: Metatastic prostate CA to bone, HTN, Afib, he has had radiation on R hip 2 years ago, bil knee arthritis.  are also affecting patient's functional outcome.   REHAB POTENTIAL: Excellent  CLINICAL DECISION MAKING: Stable/uncomplicated  EVALUATION COMPLEXITY: Low   GOALS: Goals reviewed with patient? Yes  SHORT TERM GOALS: Target date: 01/16/2023  Patient will be independent with initial HEP. Baseline:  Goal status: IN PROGRESS  2.  Balance assessment completed including review of ABC Scale, TUG and BERG Baseline:  Goal status: MET   LONG TERM GOALS: Target date: 02/13/2023  Patient will be independent with advanced/ongoing HEP to improve outcomes and carryover.  Baseline:  Goal status: IN PROGRESS  2.  Patient will report at least 75% improvement in R hip pain to improve QOL. Baseline:  Goal status: IN PROGRESS  3.  Patient will demonstrate improved functional LE strength as demonstrated by ability to perform 5xSTS without UE support in <= 12 sec. Baseline: 14.98 sec uses bil hands lightly Goal status: IN PROGRESS  4.  Patient will be able to  ambulate 600' and  negotiate curbs with LRAD and normal gait pattern without increased pain to access community.  Baseline:  Goal status: IN PROGRESS  5.  Patient will report >= 41/80 on LEFS to demonstrate improved functional ability. Baseline: LEFS  32 / 80 = 40.0 % Goal status: IN PROGRESS  6.  Patient will demonstrate 22/24 on DGI to decrease risk of falls. Baseline: 01/08/2023 -19/24 Goal status: REVISED     PLAN:  PT FREQUENCY: 2x/week  PT DURATION: 6 weeks  PLANNED INTERVENTIONS: Therapeutic exercises, Therapeutic activity, Neuromuscular re-education, Balance training, Gait training, Patient/Family education, Self Care, Joint mobilization, Stair training, Dry Needling, Electrical stimulation, Spinal mobilization, Cryotherapy, Moist heat, Taping, Ionotophoresis 50m/ml Dexamethasone, Manual therapy, and Re-evaluation  PLAN FOR NEXT SESSION: review HEP and add ITB stretch, manual therapy/DN to R quads, lumbar, ITB as indicated. Work on balance, stepping over obstacles, narrow BOS, gait with head turns   ERennie Natter PT 01/11/2023, 12:32 PM

## 2023-01-14 DIAGNOSIS — I455 Other specified heart block: Secondary | ICD-10-CM | POA: Diagnosis not present

## 2023-01-15 ENCOUNTER — Ambulatory Visit: Payer: Medicare PPO | Admitting: Physical Therapy

## 2023-01-15 ENCOUNTER — Encounter: Payer: Self-pay | Admitting: Physical Therapy

## 2023-01-15 DIAGNOSIS — M5459 Other low back pain: Secondary | ICD-10-CM | POA: Diagnosis not present

## 2023-01-15 DIAGNOSIS — M6281 Muscle weakness (generalized): Secondary | ICD-10-CM | POA: Diagnosis not present

## 2023-01-15 DIAGNOSIS — R2681 Unsteadiness on feet: Secondary | ICD-10-CM | POA: Diagnosis not present

## 2023-01-15 DIAGNOSIS — M62838 Other muscle spasm: Secondary | ICD-10-CM | POA: Diagnosis not present

## 2023-01-15 DIAGNOSIS — M25551 Pain in right hip: Secondary | ICD-10-CM | POA: Diagnosis not present

## 2023-01-15 NOTE — Therapy (Signed)
OUTPATIENT PHYSICAL THERAPY TREATMENT   Patient Name: Matthew [name]. MRN: [id number] DOB:09-Nov-1941, 82 y.o., male Today's Date: 01/15/2023  END OF SESSION:  PT End of Session - 01/15/23 0807     Visit Number 4    Date for PT Re-Evaluation 02/13/23    Authorization Type HU[employer] MCR    Authorization Time Period 01/02/23-02/13/23    Authorization - Number of Visits 12    Progress Note Due on Visit 10    PT Start Time 0805    PT Stop Time 0846    PT Time Calculation (min) 41 min    Activity Tolerance Patient tolerated treatment well    Behavior During Therapy [facility] for tasks assessed/performed              Past Medical History:  Diagnosis Date   Arthritis    Barrett's esophagus    Cancer ([place]) 2004   prostate    Dysrhythmia    GERD (gastroesophageal reflux disease)    pt denies   History of kidney stones    Hyperlipidemia    Hypertension    Pneumonia    In high school   Prostate cancer [facility])    Past Surgical History:  Procedure Laterality Date   CARDIOVERSION N/A 08/08/2021   Procedure: CARDIOVERSION;  Surgeon: Nigel Mormon, MD;  Location: [place];  Service: Cardiovascular;  Laterality: N/A;   COLONOSCOPY WITH PROPOFOL N/A 01/09/2016   Procedure: COLONOSCOPY WITH PROPOFOL;  Surgeon: Garlan Fair, MD;  [facility];  Service: Endoscopy;  Laterality: N/A;   CYSTOSCOPY     x 1   CYSTOSCOPY WITH RETROGRADE PYELOGRAM, URETEROSCOPY AND STENT PLACEMENT Left 03/30/2022   Procedure: CYSTOSCOPY WITH LEFT RETROGRADE PYELOGRAM, URETEROSCOPY AND STENT PLACEMENT;  Surgeon: Irine Seal, MD;  [facility];  Service: Urology;  Laterality: Left;  1 HR FOR CASE   ESOPHAGOGASTRODUODENOSCOPY (EGD) WITH PROPOFOL N/A 01/09/2016   Procedure: ESOPHAGOGASTRODUODENOSCOPY (EGD) WITH PROPOFOL;  Surgeon: Garlan Fair, MD;  [facility];  Service: Endoscopy;  Laterality: N/A;   EXTRACORPOREAL SHOCK WAVE LITHOTRIPSY Left 01/25/2022   Procedure: LEFT  EXTRACORPOREAL SHOCK WAVE LITHOTRIPSY (ESWL);  Surgeon: Vira Agar, MD;  Location: [facility];  Service: Urology;  Laterality: Left;   EYE SURGERY     bil cataract   KNEE ARTHROSCOPY Left 25 yrs ago   PARATHYROIDECTOMY N/A 02/25/2020   Procedure: NECK EXPLORATION WITH PARATHYROIDECTOMY;  Surgeon: Armandina Gemma, MD;  [facility];  Service: General;  Laterality: N/A;   PROSTATECTOMY  2004   Patient Active Problem List   Diagnosis Date Noted   Chronic kidney disease, stage 3a ([place]) - Baseline Scr 1.5-1.9 12/26/2022   Gastro-esophageal reflux disease with esophagitis 12/26/2022   Paroxysmal atrial fibrillation ([place]) 12/26/2022   Retroperitoneal hematoma 12/25/2022   Typical atrial flutter ([place]) 07/18/2021   Chronic heart failure with preserved ejection fraction ([place]) 07/18/2021   Essential hypertension 07/11/2021   Malignant neoplasm of prostate metastatic to bone ([place]) 04/25/2021   Primary hyperparathyroidism ([place]) 02/25/2020    PCP: Wenda Low, MD   REFERRING PROVIDER: Wenda Low, MD   REFERRING DIAG: [phone] (ICD-10-CM) - Retroperitoneal hematoma   THERAPY DIAG:  Pain in right hip  Muscle weakness (generalized)  Other muscle spasm  Other low back pain  Unsteadiness on feet  Rationale for Evaluation and Treatment: Rehabilitation  ONSET DATE: 12/25/22  SUBJECTIVE:   SUBJECTIVE STATEMENT: Busy morning, but no new concerns.    NEXT MD VISIT: 2/15  Oncologist, starting radiation treatment  PERTINENT HISTORY: Metatastic prostate CA to bone, HTN, Afib, he has had radiation on R hip 2 years ago, bil knee arthritis.  PAIN:  Are you having pain? No  PRECAUTIONS: None  WEIGHT BEARING RESTRICTIONS: No  FALLS:  Has patient fallen in last 6 months? No  LIVING ENVIRONMENT: Lives with: lives with their spouse Lives in: House/apartment Stairs: Yes: External: 5 steps; can reach both Has following equipment at home: Single point cane and  Walker - 4 wheeled  OCCUPATION: desk work  PLOF: Independent  PATIENT GOALS: get back into exercising and get some stretching and strength    OBJECTIVE:   DIAGNOSTIC FINDINGS:  CT renal stone was done which showed a retroperitoneal intramuscular hematoma of the right psoas muscle measuring 8 x 8 cm   PATIENT SURVEYS:  ABC scale 55% (01/08/23) LEFS  32 / 80 = 40.0 %  COGNITION: Overall cognitive status: Within functional limits for tasks assessed     SENSATION: WFL   EDEMA:  Reports swelling bil feet on meds  MUSCLE LENGTH: [id number] R, mod L Quads: bil tightness ITB: mod R Piriformis: marked R, mild L Hip Flexors: bil tightness Heelcords:   POSTURE: rounded shoulders, forward head, and increased thoracic kyphosis  PALPATION: Palpation: TTP at R QL. Increased tissue tension in R lumbar   LOWER EXTREMITY MMT  MMT Right eval Left eval  Hip flexion 3 5  Hip extension 5 4+  Hip abduction 5 4+  Hip adduction 5 5  Hip internal rotation    Hip external rotation    Knee flexion 5 5  Knee extension 5 5  Ankle dorsiflexion 5 5   (Blank rows = not tested)  LUMBAR ROM: flex WFL, ext mild pain in mid back WFL, bil rot 75% left tighter, bil SB WFL feels on right with left SB  LOWER EXTREMITY ROM:  WFL  FUNCTIONAL TESTS:  5 times sit to stand: 14.98 sec uses bil hands lightly Berg Balance Scale: 49/56 Dynamic Gait Index: 19/24  GAIT: Distance walked: 40 ft Assistive device utilized: Single point cane Level of assistance: Modified independence Comments: decreased heel strike, step length R   TODAY'S TREATMENT:                                                                                                                              DATE:   01/15/2023 Therapeutic Exercise: to improve strength and mobility.  Demo, verbal and tactile cues throughout for technique. Nustep L6 x 7 min  Leg extensions 25# 2 x 10  Hamstring culrs 25# 2 x 10  Neuromuscular  Reeducation: to improve balance and stability. SBA for safety throughout.  Star excursion pattern with CGA all directions, cues for posture, mirror for feedback, progressed to calling random feet and positions.  Cone taps - alternating feet Cone tip and replace to promote SLS Balance board M/L x 2 min side to side, then 1 min  hold - cues for posture Tandem stance x 1 min bil     01/11/2023 Therapeutic Exercise: to improve strength and mobility.  Demo, verbal and tactile cues throughout for technique. Nustep L6 x 7 min  Leg extensions 20# 2 x 10  Hamstring curl 20# 2 x 10  Leg Press 25# x 10 - difficulty keeping feet on plate.  Neuromuscular Reeducation: to improve balance and stability. SBA for safety throughout.  On Airex: Eyes open feet apart x 30 sec  Eyes open feet apart with head nods x 10 Eyes open feet apart with head turns x 10 Eyes closed feet apart x 30 sec  On level surface: Eyes open feet together x 30 sec  Eyes open feet together with head nods x 10 Eyes open feet together with head turns x 10 Eyes closed feet together x 30 sec Eyes closed feet together with head nods x 10 Eyes closed feet together with head turns x 10  Tandem Stance x 30 sec bil    01/08/23 Therapeutic Activity: to improve functional performance. BERG balance scale assessed DGI assessed ABC scale: 880/1600 - 55%  Therapeutic Exercise: to improve strength and mobility.  Demo, verbal and tactile cues throughout for technique.  Seated hamstring stretch x 30 sec bil STS x 10 Supine quad and hip flexor stretch reviewed Standing hip abduction x 10 bil Standing marches x 10 bil Standing heel raise x 10 bil Standing toe raise x 10 bil  01/02/23 See pt ed  PATIENT EDUCATION:  Education details: HEP update  Person educated: Patient Education method: Explanation, Demonstration, Tactile cues, Verbal cues, and Handouts Education comprehension: verbalized understanding and returned  demonstration  HOME EXERCISE PROGRAM: Access Code: [email or web address] URL: [email or web address] Date: 01/11/2023 Prepared by: Glenetta Hew  Exercises Added  - Standing Balance in Corner  - 1 x daily - 7 x weekly - Standing Balance in Corner with Eyes Closed  - 1 x daily - 7 x weekly - Tandem Stance in Corner  - 1 x daily - 7 x weekly  ASSESSMENT:  CLINICAL IMPRESSION: [name]. Reports no changes today.  Continued to work on LE strengthening and balance, with focus on SLS activities which where challenging, SBA/CGA needed but no large LOB during session.  He was able to maintain tandem stance x 1 min each side once in tandem position today.    [name]. continues to demonstrate potential for improvement and would benefit from continued skilled therapy to address impairments.     OBJECTIVE IMPAIRMENTS: Abnormal gait, decreased ROM, decreased strength, increased fascial restrictions, increased muscle spasms, impaired flexibility, and pain.   ACTIVITY LIMITATIONS: squatting, stairs, bed mobility, dressing, and locomotion level  PARTICIPATION LIMITATIONS: driving  PERSONAL FACTORS: 3+ comorbidities: Metatastic prostate CA to bone, HTN, Afib, he has had radiation on R hip 2 years ago, bil knee arthritis.  are also affecting patient's functional outcome.   REHAB POTENTIAL: Excellent  CLINICAL DECISION MAKING: Stable/uncomplicated  EVALUATION COMPLEXITY: Low   GOALS: Goals reviewed with patient? Yes  SHORT TERM GOALS: Target date: 01/16/2023  Patient will be independent with initial HEP. Baseline:  Goal status: IN PROGRESS  2.  Balance assessment completed including review of ABC Scale, TUG and BERG Baseline:  Goal status: MET   LONG TERM GOALS: Target date: 02/13/2023  Patient will be independent with advanced/ongoing HEP to improve outcomes and carryover.  Baseline:  Goal status: IN PROGRESS  2.  Patient will report at least 75% improvement  in R  hip pain to improve QOL. Baseline:  Goal status: IN PROGRESS  3.  Patient will demonstrate improved functional LE strength as demonstrated by ability to perform 5xSTS without UE support in <= 12 sec. Baseline: 14.98 sec uses bil hands lightly Goal status: IN PROGRESS  4.  Patient will be able to ambulate 600' and negotiate curbs with LRAD and normal gait pattern without increased pain to access community.  Baseline:  Goal status: IN PROGRESS  5.  Patient will report >= 41/80 on LEFS to demonstrate improved functional ability. Baseline: LEFS  32 / 80 = 40.0 % Goal status: IN PROGRESS  6.  Patient will demonstrate 22/24 on DGI to decrease risk of falls. Baseline: 01/08/2023 -19/24 Goal status: REVISED     PLAN:  PT FREQUENCY: 2x/week  PT DURATION: 6 weeks  PLANNED INTERVENTIONS: Therapeutic exercises, Therapeutic activity, Neuromuscular re-education, Balance training, Gait training, Patient/Family education, Self Care, Joint mobilization, Stair training, Dry Needling, Electrical stimulation, Spinal mobilization, Cryotherapy, Moist heat, Taping, Ionotophoresis 43m/ml Dexamethasone, Manual therapy, and Re-evaluation  PLAN FOR NEXT SESSION: review HEP and add ITB stretch, manual therapy/DN to R quads, lumbar, ITB as indicated. Work on balance, stepping over obstacles, narrow BOS, gait with head turns   ERennie Natter PT, DPT  01/15/2023, 9:03 AM

## 2023-01-17 ENCOUNTER — Ambulatory Visit: Payer: Medicare PPO | Admitting: Physical Therapy

## 2023-01-17 ENCOUNTER — Ambulatory Visit
Admission: RE | Admit: 2023-01-17 | Discharge: 2023-01-17 | Disposition: A | Discharge status: Home / Self Care | Payer: Medicare PPO | Source: Ambulatory Visit | Attending: Radiation Oncology | Admitting: Radiation Oncology

## 2023-01-17 ENCOUNTER — Ambulatory Visit
Admission: RE | Admit: 2023-01-17 | Discharge: 2023-01-17 | Disposition: A | Discharge status: Home / Self Care | Payer: Medicare PPO | Source: Ambulatory Visit | Attending: Urology | Admitting: Urology

## 2023-01-17 ENCOUNTER — Encounter: Payer: Self-pay | Admitting: Physical Therapy

## 2023-01-17 ENCOUNTER — Other Ambulatory Visit: Payer: Self-pay

## 2023-01-17 ENCOUNTER — Encounter: Payer: Self-pay | Admitting: Urology

## 2023-01-17 VITALS — BP 118/72 | HR 79 | Temp 97.9°F | Resp 20 | Ht 72.0 in | Wt 232.4 lb

## 2023-01-17 DIAGNOSIS — K5730 Diverticulosis of large intestine without perforation or abscess without bleeding: Secondary | ICD-10-CM | POA: Diagnosis not present

## 2023-01-17 DIAGNOSIS — K219 Gastro-esophageal reflux disease without esophagitis: Secondary | ICD-10-CM | POA: Diagnosis not present

## 2023-01-17 DIAGNOSIS — M6281 Muscle weakness (generalized): Secondary | ICD-10-CM | POA: Diagnosis not present

## 2023-01-17 DIAGNOSIS — M129 Arthropathy, unspecified: Secondary | ICD-10-CM | POA: Diagnosis not present

## 2023-01-17 DIAGNOSIS — I700 Atherosclerosis of aorta: Secondary | ICD-10-CM | POA: Insufficient documentation

## 2023-01-17 DIAGNOSIS — Z923 Personal history of irradiation: Secondary | ICD-10-CM | POA: Diagnosis not present

## 2023-01-17 DIAGNOSIS — M5459 Other low back pain: Secondary | ICD-10-CM

## 2023-01-17 DIAGNOSIS — R2681 Unsteadiness on feet: Secondary | ICD-10-CM | POA: Diagnosis not present

## 2023-01-17 DIAGNOSIS — M62838 Other muscle spasm: Secondary | ICD-10-CM

## 2023-01-17 DIAGNOSIS — I10 Essential (primary) hypertension: Secondary | ICD-10-CM | POA: Insufficient documentation

## 2023-01-17 DIAGNOSIS — E785 Hyperlipidemia, unspecified: Secondary | ICD-10-CM | POA: Diagnosis not present

## 2023-01-17 DIAGNOSIS — C61 Malignant neoplasm of prostate: Secondary | ICD-10-CM | POA: Insufficient documentation

## 2023-01-17 DIAGNOSIS — M25551 Pain in right hip: Secondary | ICD-10-CM

## 2023-01-17 DIAGNOSIS — C7951 Secondary malignant neoplasm of bone: Secondary | ICD-10-CM | POA: Insufficient documentation

## 2023-01-17 DIAGNOSIS — Z87442 Personal history of urinary calculi: Secondary | ICD-10-CM | POA: Diagnosis not present

## 2023-01-17 NOTE — Therapy (Signed)
OUTPATIENT PHYSICAL THERAPY TREATMENT   Patient Name: Matthew [name]. MRN: [id number] DOB:[birth year], 82 y.o., male Today's Date: 01/17/2023  END OF SESSION:  PT End of Session - 01/17/23 0806     Visit Number 5    Date for PT Re-Evaluation 02/13/23    Authorization Type [employer] MCR    Authorization Time Period 01/02/23-02/13/23    Authorization - Number of Visits 12    Progress Note Due on Visit 10    PT Start Time 0804    PT Stop Time 0848    PT Time Calculation (min) 44 min    Activity Tolerance Patient tolerated treatment well    Behavior During Therapy [facility] for tasks assessed/performed              Past Medical History:  Diagnosis Date   Arthritis    Barrett's esophagus    Cancer ([place]) 2004   prostate    Dysrhythmia    GERD (gastroesophageal reflux disease)    pt denies   History of kidney stones    Hyperlipidemia    Hypertension    Pneumonia    In high school   Prostate cancer [facility])    Past Surgical History:  Procedure Laterality Date   CARDIOVERSION N/A 08/08/2021   Procedure: CARDIOVERSION;  Surgeon: Nigel Mormon, MD;  Location: [facility];  Service: Cardiovascular;  Laterality: N/A;   COLONOSCOPY WITH PROPOFOL N/A 01/09/2016   Procedure: COLONOSCOPY WITH PROPOFOL;  Surgeon: Garlan Fair, MD;  Location: [facility];  Service: Endoscopy;  Laterality: N/A;   CYSTOSCOPY     x 1   CYSTOSCOPY WITH RETROGRADE PYELOGRAM, URETEROSCOPY AND STENT PLACEMENT Left 03/30/2022   Procedure: CYSTOSCOPY WITH LEFT RETROGRADE PYELOGRAM, URETEROSCOPY AND STENT PLACEMENT;  Surgeon: Irine Seal, MD;  Location: [facility];  Service: Urology;  Laterality: Left;  1 HR FOR CASE   ESOPHAGOGASTRODUODENOSCOPY (EGD) WITH PROPOFOL N/A 01/09/2016   Procedure: ESOPHAGOGASTRODUODENOSCOPY (EGD) WITH PROPOFOL;  Surgeon: Garlan Fair, MD;  Location: [facility];  Service: Endoscopy;  Laterality: N/A;   EXTRACORPOREAL SHOCK WAVE LITHOTRIPSY Left 01/25/2022   Procedure: LEFT  EXTRACORPOREAL SHOCK WAVE LITHOTRIPSY (ESWL);  Surgeon: Vira Agar, MD;  Location: [facility];  Service: Urology;  Laterality: Left;   EYE SURGERY     bil cataract   KNEE ARTHROSCOPY Left 25 yrs ago   PARATHYROIDECTOMY N/A 02/25/2020   Procedure: NECK EXPLORATION WITH PARATHYROIDECTOMY;  Surgeon: Armandina Gemma, MD;  Location: [facility];  Service: General;  Laterality: N/A;   PROSTATECTOMY  2004   Patient Active Problem List   Diagnosis Date Noted   Chronic kidney disease, stage 3a ([place]) - Baseline Scr 1.5-1.9 12/26/2022   Gastro-esophageal reflux disease with esophagitis 12/26/2022   Paroxysmal atrial fibrillation ([place]) 12/26/2022   Retroperitoneal hematoma 12/25/2022   Typical atrial flutter ([place]) 07/18/2021   Chronic heart failure with preserved ejection fraction ([place]) 07/18/2021   Essential hypertension 07/11/2021   Malignant neoplasm of prostate metastatic to bone ([place]) 04/25/2021   Primary hyperparathyroidism ([place]) 02/25/2020    PCP: Wenda Low, MD   REFERRING PROVIDER: Wenda Low, MD   REFERRING DIAG: [phone] (ICD-10-CM) - Retroperitoneal hematoma   THERAPY DIAG:  Muscle weakness (generalized)  Pain in right hip  Unsteadiness on feet  Other muscle spasm  Other low back pain  Rationale for Evaluation and Treatment: Rehabilitation  ONSET DATE: 12/25/22  SUBJECTIVE:   SUBJECTIVE STATEMENT: Reported woke up yesterday very sore both legs from knees down.  Arrived  today with [facility]PC.      NEXT MD VISIT: 2/15 Oncologist, starting radiation treatment  PERTINENT HISTORY: Metatastic prostate CA to bone, HTN, Afib, he has had radiation on R hip 2 years ago, bil knee arthritis.  PAIN:  Are you having pain? Yes: NPRS scale: 6/10 Pain location: bil LE knees down Pain description: sore  PRECAUTIONS: None  WEIGHT BEARING RESTRICTIONS: No  FALLS:  Has patient fallen in last 6 months? No  LIVING ENVIRONMENT: Lives with: lives with their  spouse Lives in: House/apartment Stairs: Yes: External: 5 steps; can reach both Has following equipment at home: Single point cane and Walker - 4 wheeled  OCCUPATION: desk work  PLOF: Independent  PATIENT GOALS: get back into exercising and get some stretching and strength    OBJECTIVE:   DIAGNOSTIC FINDINGS:  CT renal stone was done which showed a retroperitoneal intramuscular hematoma of the right psoas muscle measuring 8 x 8 cm   PATIENT SURVEYS:  ABC scale 55% (01/08/23) LEFS  32 / 80 = 40.0 %  COGNITION: Overall cognitive status: Within functional limits for tasks assessed     SENSATION: WFL   EDEMA:  Reports swelling bil feet on meds  MUSCLE LENGTH: [id number] R, mod L Quads: bil tightness ITB: mod R Piriformis: marked R, mild L Hip Flexors: bil tightness Heelcords:   POSTURE: rounded shoulders, forward head, and increased thoracic kyphosis  PALPATION: Palpation: TTP at R QL. Increased tissue tension in R lumbar   LOWER EXTREMITY MMT  MMT Right eval Left eval  Hip flexion 3 5  Hip extension 5 4+  Hip abduction 5 4+  Hip adduction 5 5  Hip internal rotation    Hip external rotation    Knee flexion 5 5  Knee extension 5 5  Ankle dorsiflexion 5 5   (Blank rows = not tested)  LUMBAR ROM: flex WFL, ext mild pain in mid back WFL, bil rot 75% left tighter, bil SB WFL feels on right with left SB  LOWER EXTREMITY ROM:  WFL  FUNCTIONAL TESTS:  5 times sit to stand: 14.98 sec uses bil hands lightly Berg Balance Scale: 49/56 Dynamic Gait Index: 19/24  GAIT: Distance walked: 40 ft Assistive device utilized: Single point cane Level of assistance: Modified independence Comments: decreased heel strike, step length R   TODAY'S TREATMENT:                                                                                                                              DATE:    01/17/2023 Therapeutic Exercise: to improve strength and mobility.  Demo, verbal  and tactile cues throughout for technique. Nustep L5 x 6 min  LTR x 10 bil Bridges x20  SLR 2 x 10 bil  S/L SLR 2 x 10 bil  Prone hamstring curls 2 x 10 bil Prone hip extension x 10 bil  Manual Therapy: to decrease muscle spasm and pain and improve mobility IASTM  to bil calves and plantar fascia bil feet, retrograde massage bil calves and ant tib.   01/15/2023 Therapeutic Exercise: to improve strength and mobility.  Demo, verbal and tactile cues throughout for technique. Nustep L6 x 7 min  Leg extensions 25# 2 x 10  Hamstring curls 25# 2 x 10  Neuromuscular Reeducation: to improve balance and stability. SBA for safety throughout.  Star excursion pattern with CGA all directions, cues for posture, mirror for feedback, progressed to calling random feet and positions.  Cone taps - alternating feet Cone tip and replace to promote SLS Balance board M/L x 2 min side to side, then 1 min hold - cues for posture Tandem stance x 1 min bil   01/11/2023 Therapeutic Exercise: to improve strength and mobility.  Demo, verbal and tactile cues throughout for technique. Nustep L6 x 7 min  Leg extensions 20# 2 x 10  Hamstring curl 20# 2 x 10  Leg Press 25# x 10 - difficulty keeping feet on plate.  Neuromuscular Reeducation: to improve balance and stability. SBA for safety throughout.  On Airex: Eyes open feet apart x 30 sec  Eyes open feet apart with head nods x 10 Eyes open feet apart with head turns x 10 Eyes closed feet apart x 30 sec  On level surface: Eyes open feet together x 30 sec  Eyes open feet together with head nods x 10 Eyes open feet together with head turns x 10 Eyes closed feet together x 30 sec Eyes closed feet together with head nods x 10 Eyes closed feet together with head turns x 10  Tandem Stance x 30 sec bil    PATIENT EDUCATION:  Education details: HEP update  Person educated: Patient Education method: Explanation, Demonstration, Tactile cues, Verbal cues, and  Handouts Education comprehension: verbalized understanding and returned demonstration  HOME EXERCISE PROGRAM: Access Code: [email or web address] URL: [email or web address] Date: 01/11/2023 Prepared by: Glenetta Hew  Exercises Added  - Standing Balance in Corner  - 1 x daily - 7 x weekly - Standing Balance in Corner with Eyes Closed  - 1 x daily - 7 x weekly - Tandem Stance in Corner  - 1 x daily - 7 x weekly  ASSESSMENT:  CLINICAL IMPRESSION: [name]. Reported significant soreness in bil lower legs and feet today, mostly likely due to increased muscle activation with balance work last session.  Today focused on mat level exercises for hip strengthening to avoid increased soreness, followed by manual therapy to bil calves and feet to decrease soreness and pain.  Responded well and reported significant improvement in pain at end of session.  [name]. continues to demonstrate potential for improvement and would benefit from continued skilled therapy to address impairments.     OBJECTIVE IMPAIRMENTS: Abnormal gait, decreased ROM, decreased strength, increased fascial restrictions, increased muscle spasms, impaired flexibility, and pain.   ACTIVITY LIMITATIONS: squatting, stairs, bed mobility, dressing, and locomotion level  PARTICIPATION LIMITATIONS: driving  PERSONAL FACTORS: 3+ comorbidities: Metatastic prostate CA to bone, HTN, Afib, he has had radiation on R hip 2 years ago, bil knee arthritis.  are also affecting patient's functional outcome.   REHAB POTENTIAL: Excellent  CLINICAL DECISION MAKING: Stable/uncomplicated  EVALUATION COMPLEXITY: Low   GOALS: Goals reviewed with patient? Yes  SHORT TERM GOALS: Target date: 01/16/2023  Patient will be independent with initial HEP. Baseline:  Goal status: IN PROGRESS  2.  Balance assessment completed including review of ABC Scale, TUG and BERG Baseline:  Goal status: MET   LONG TERM GOALS: Target date:  02/13/2023  Patient will be independent with advanced/ongoing HEP to improve outcomes and carryover.  Baseline:  Goal status: IN PROGRESS  2.  Patient will report at least 75% improvement in R hip pain to improve QOL. Baseline:  Goal status: IN PROGRESS  3.  Patient will demonstrate improved functional LE strength as demonstrated by ability to perform 5xSTS without UE support in <= 12 sec. Baseline: 14.98 sec uses bil hands lightly Goal status: IN PROGRESS  4.  Patient will be able to ambulate 600' and negotiate curbs with LRAD and normal gait pattern without increased pain to access community.  Baseline:  Goal status: IN PROGRESS  5.  Patient will report >= 41/80 on LEFS to demonstrate improved functional ability. Baseline: LEFS  32 / 80 = 40.0 % Goal status: IN PROGRESS  6.  Patient will demonstrate 22/24 on DGI to decrease risk of falls. Baseline: 01/08/2023 -19/24 Goal status: IN PROGRESS     PLAN:  PT FREQUENCY: 2x/week  PT DURATION: 6 weeks  PLANNED INTERVENTIONS: Therapeutic exercises, Therapeutic activity, Neuromuscular re-education, Balance training, Gait training, Patient/Family education, Self Care, Joint mobilization, Stair training, Dry Needling, Electrical stimulation, Spinal mobilization, Cryotherapy, Moist heat, Taping, Ionotophoresis 43m/ml Dexamethasone, Manual therapy, and Re-evaluation  PLAN FOR NEXT SESSION: review HEP and add ITB stretch, manual therapy/DN to R quads, lumbar, ITB as indicated. Work on balance, stepping over obstacles, narrow BOS, gait with head turns   ERennie Natter PT, DPT  01/17/2023, 8:50 AM

## 2023-01-17 NOTE — Progress Notes (Signed)
Radiation Oncology         [id number]) [phone] ________________________________  Outpatient Re-Consultation  Name: Matthew [name]. MRN: [id number]  Date: 01/17/2023  DOB: 1941-09-23  [id number], Denton Ar, MD  Irine Seal, MD   REFERRING PHYSICIAN: Irine Seal, MD  DIAGNOSIS: 82 y.o. gentleman with oligometastatic prostate cancer involving a solitary, left posterior 12th rib.    ICD-10-CM   1. Malignant neoplasm of prostate metastatic to bone [facility])  C61    C79.51        HISTORY OF PRESENT ILLNESS: Matthew [name]. is a 82 y.o. male with oligometastatic prostate cancer.  In summary, he was initially diagnosed in 2004 and was treated with prostatectomy followed by adjuvant radiation therapy while he was living in [place]. He relocated to [facility] in 2007 and was noted to have a rising, detectable PSA at 3.36 in 05/2009 so he was enrolled in the [facility] CS37 Open-label study of Degarelix Intermittent Therapy vs. Continuous Androgen Deprivation Therapy with Leuprolide or Degarelix in Patients with Carcinoma of the Prostate with Biochemical Failure after Localized Therapy.  He was randomized to arm C continuous Leuprolide Acetate (11-Month) starting on 08/01/2009 with his last dose in 07/2010. His PSA reached a nadir of 0.02  and he continued in routine follow-up with Dr. Jeffie Pollock and his PCP, Dr. Lysle Rubens. Since completing ADT, his PSA increased with the recovery of his testosterone, reaching 8.62 in 08/2016 and then fluctuated but overall remained stable between 7.5 and 8.6 for several years.  His PSA was stable at 7.34 on 03/20/2021 and in fact, decreased from 8.51 on 12/02/2019.  At the time of a routine urology follow-up on 03/27/2021, he had a KUB for monitoring of his nephrolithiasis and this showed a new, 2.8 cm sclerotic lesion in the right inferior pubic ramus, nonpainful.  He proceeded to CT A/P on 04/04/21 to further evaluate the lesion, as well as left hydronephrosis and nephrolithiasis.  The CT A/P  confirmed the sclerotic lesion in the right ischium as well as a tiny sclerotic lesion in the right posterior acetabulum, consistent with metastatic disease.  There was no malignant appearing lymphadenopathy in the abdomen or pelvis and no evidence of visceral metastatic disease.   We met with the patient on 04/25/21 to discuss potential salvage radiation treatment options for management of the new oligometastatic osseous lesions. We recommended proceeding with PSMA scan to determine the extent of disease and help guide treatment planning. PSMA was performed on 05/23/21 and showed mild tracer affinity corresponding to the right ischial sclerotic lesion, most consistent with osseous metastasis. There was also some tracer affinity at the superior aspect of the prostatectomy bed without any well-defined mass and likely to be artifactual secondary to adjacent bladder activity. Otherwise, no soft tissue or visceral metastasis was identified. There were a couple of small nonspecific pulmonary nodules.  The patient elected to proceed with a 5 fraction course of SBRT, completed in July 2022 and tolerated very well.  His posttreatment PSA remained stable elevated at 7.57 in October 2022 and 7.4 in May 2023 but increased up to 11.6 most recently on 12/14/2022.  A repeat PSMA PET scan was performed for further evaluation of the rising PSA and this demonstrated an excellent response to the previously treated lesions in the right pelvis with no evidence of local recurrence or malignant lymphadenopathy but there was a solitary metastatic lesion in the left posterior 12th rib.  He has therefore been kindly referred back to [place] today  to discuss potential radiation treatment options in the management of his oligometastatic disease.  PREVIOUS RADIATION THERAPY: Yes  06/06/21 - 06/16/21: The targets in the right ischium and right acetabulum were treated to 50 Gy in 5 fractions of 10 Gy  2004: Adjuvant radiotherapy to the  prostatic fossa in [place]  PAST MEDICAL HISTORY:  Past Medical History:  Diagnosis Date   Arthritis    Barrett's esophagus    Cancer ([place]) 2004   prostate    Dysrhythmia    GERD (gastroesophageal reflux disease)    pt denies   History of kidney stones    Hyperlipidemia    Hypertension    Pneumonia    In high school   Prostate cancer ([place])       PAST SURGICAL HISTORY: Past Surgical History:  Procedure Laterality Date   CARDIOVERSION N/A 08/08/2021   Procedure: CARDIOVERSION;  Surgeon: Nigel Mormon, MD;  Location: [place] [facility];  Service: Cardiovascular;  Laterality: N/A;   COLONOSCOPY WITH PROPOFOL N/A 01/09/2016   Procedure: COLONOSCOPY WITH PROPOFOL;  Surgeon: Garlan Fair, MD;  Location: [facility];  Service: Endoscopy;  Laterality: N/A;   CYSTOSCOPY     x 1   CYSTOSCOPY WITH RETROGRADE PYELOGRAM, URETEROSCOPY AND STENT PLACEMENT Left 03/30/2022   Procedure: CYSTOSCOPY WITH LEFT RETROGRADE PYELOGRAM, URETEROSCOPY AND STENT PLACEMENT;  Surgeon: Irine Seal, MD;  Location: [facility];  Service: Urology;  Laterality: Left;  1 HR FOR CASE   ESOPHAGOGASTRODUODENOSCOPY (EGD) WITH PROPOFOL N/A 01/09/2016   Procedure: ESOPHAGOGASTRODUODENOSCOPY (EGD) WITH PROPOFOL;  Surgeon: Garlan Fair, MD;  Location: [facility];  Service: Endoscopy;  Laterality: N/A;   EXTRACORPOREAL SHOCK WAVE LITHOTRIPSY Left 01/25/2022   Procedure: LEFT EXTRACORPOREAL SHOCK WAVE LITHOTRIPSY (ESWL);  Surgeon: Vira Agar, MD;  [facility];  Service: Urology;  Laterality: Left;   EYE SURGERY     bil cataract   KNEE ARTHROSCOPY Left 25 yrs ago   PARATHYROIDECTOMY N/A 02/25/2020   Procedure: NECK EXPLORATION WITH PARATHYROIDECTOMY;  Surgeon: Armandina Gemma, MD;  Location: [facility];  Service: General;  Laterality: N/A;   PROSTATECTOMY  2004    FAMILY HISTORY:  Family History  Problem Relation Age of Onset   Heart failure Mother    Heart disease Mother    Breast cancer Neg  Hx    Prostate cancer Neg Hx    Colon cancer Neg Hx    Pancreatic cancer Neg Hx     SOCIAL HISTORY:  Social History   Socioeconomic History   Marital status: Married    Spouse name: Not on file   Number of children: 5   Years of education: Not on file   Highest education level: Not on file  Occupational History   Not on file  Tobacco Use   Smoking status: Never   Smokeless tobacco: Never  Vaping Use   Vaping Use: Never used  Substance and Sexual Activity   Alcohol use: Yes    Alcohol/week: 4.0 standard drinks of alcohol    Types: 4 Cans of beer per week    Comment: occasional beer   Drug use: No   Sexual activity: Not Currently  Other Topics Concern   Not on file  Social History Narrative   Not on file   Social Determinants of Health   Financial Resource Strain: Not on file  Food Insecurity: No Food Insecurity (12/25/2022)   Hunger Vital Sign    Worried About Running Out of Food in the Last  Year: Never true    [place] in the Last Year: Never true  Transportation Needs: No Transportation Needs (12/25/2022)   PRAPARE - Hydrologist (Medical): No    Lack of Transportation (Non-Medical): No  Physical Activity: Not on file  Stress: Not on file  Social Connections: Not on file  Intimate Partner Violence: Not At Risk (12/25/2022)   Humiliation, Afraid, Rape, and Kick questionnaire    Fear of Current or Ex-Partner: No    Emotionally Abused: No    Physically Abused: No    Sexually Abused: No    ALLERGIES: Patient has no known allergies.  MEDICATIONS:  Current Outpatient Medications  Medication Sig Dispense Refill   allopurinol (ZYLOPRIM) 300 MG tablet Take 300 mg by mouth daily.      atorvastatin (LIPITOR) 20 MG tablet Take 20 mg by mouth daily.     esomeprazole (NEXIUM) 20 MG capsule Take 20 mg by mouth daily.      Multiple Vitamin (MULTIVITAMIN WITH MINERALS) TABS tablet Take 1 tablet by mouth daily. Men +     potassium  chloride SA (KLOR-CON M) 20 MEQ tablet TAKE 1 TABLET EVERY DAY 90 tablet 3   Torsemide 40 MG TABS Take 40 mg by mouth 2 (two) times daily. (Patient taking differently: Take 40-80 mg by mouth See admin instructions. Taking 80 mg in the morning and 40 mg in the evening) 180 tablet 3   No current facility-administered medications for this encounter.    REVIEW OF SYSTEMS:  On review of systems, the patient reports that he is doing well overall. He denies any chest pain, shortness of breath, cough, fevers, chills, night sweats, or unintended weight changes. He denies any bowel disturbances, and denies abdominal pain, nausea or vomiting. He denies any new musculoskeletal or joint aches or pains.  A complete review of systems is obtained and is otherwise negative.  PHYSICAL EXAM:  Wt Readings from Last 3 Encounters:  01/17/23 232 lb 6.4 oz (105.4 kg)  12/27/22 229 lb 4.5 oz (104 kg)  12/19/22 233 lb (105.7 kg)   Temp Readings from Last 3 Encounters:  01/17/23 97.9 F (36.6 C)  12/27/22 97.7 F (36.5 C) (Oral)  05/30/22 98.4 F (36.9 C) (Temporal)   BP Readings from Last 3 Encounters:  01/17/23 118/72  12/27/22 113/77  12/19/22 128/80   Pulse Readings from Last 3 Encounters:  01/17/23 79  12/27/22 89  12/19/22 60   Pain Assessment Pain Score: 0-No pain/10  In general this is a well appearing Caucasian man in no acute distress. He's alert and oriented x4 and appropriate throughout the examination. Cardiopulmonary assessment is negative for acute distress, and he exhibits normal effort.     KPS = 90  100 - Normal; no complaints; no evidence of disease. 90   - Able to carry on normal activity; minor signs or symptoms of disease. 80   - Normal activity with effort; some signs or symptoms of disease. 73   - Cares for self; unable to carry on normal activity or to do active work. 60   - Requires occasional assistance, but is able to care for most of his personal needs. 50   - Requires  considerable assistance and frequent medical care. 62   - Disabled; requires special care and assistance. 63   - Severely disabled; hospital admission is indicated although death not imminent. 6   - Very sick; hospital admission necessary; active supportive treatment necessary.  10   - Moribund; fatal processes progressing rapidly. 0     - Dead  Karnofsky DA, Abelmann Goldsmith, Craver LS and Burchenal [place] [phone]) The use of the nitrogen mustards in the palliative treatment of carcinoma: with particular reference to bronchogenic carcinoma Cancer 1 [id number]  LABORATORY DATA:  Lab Results  Component Value Date   WBC 10.1 12/27/2022   HGB 11.6 (L) 12/27/2022   HCT 36.2 (L) 12/27/2022   MCV 100.0 12/27/2022   PLT 248 12/27/2022   Lab Results  Component Value Date   NA 139 12/27/2022   K 3.6 12/27/2022   CL 103 12/27/2022   CO2 26 12/27/2022   Lab Results  Component Value Date   ALT 15 12/26/2022   AST 18 12/26/2022   ALKPHOS 64 12/26/2022   BILITOT 0.8 12/26/2022     RADIOGRAPHY: NM PET (PSMA) SKULL TO MID THIGH  Result Date: 01/05/2023 CLINICAL DATA:  Post prostatectomy and radiation treatment. Rising PSA equal 11. EXAM: NUCLEAR MEDICINE PET SKULL BASE TO THIGH TECHNIQUE: 9.4 mCi F18 Piflufolastat (Pylarify) was injected intravenously. Full-ring PET imaging was performed from the skull base to thigh after the radiotracer. CT data was obtained and used for attenuation correction and anatomic localization. COMPARISON:  DOTATATE PET scan 05/23/2021 FINDINGS: NECK No radiotracer activity in neck lymph nodes. Incidental CT finding: None. CHEST No radiotracer accumulation within mediastinal or hilar lymph nodes. No suspicious pulmonary nodules on the CT scan. Incidental CT finding: Large sliding-type hiatal hernia. ABDOMEN/PELVIS Prostate: No clear radiotracer activity in prostatectomy bed. Small amount of midline activity inferior to the bladder is favored urine within the proximal urethra. Lymph  nodes: No abnormal radiotracer accumulation within pelvic or abdominal nodes. Liver: No evidence of liver metastasis. Incidental CT finding: None. SKELETON Single new focus radiotracer activity in the medial LEFT twelfth rib with SUV max equal 10.1 (image 137). There is a sclerotic lesion measuring 8 mm on the CT portion exam. IMPRESSION: 1. New solitary sclerotic lesion in the medial LEFT twelfth rib with radiotracer activity is most consistent with oligometastatic skeletal prostate cancer. 2. No evidence local prostate cancer recurrence in the prostatectomy bed. 3. No evidence metastatic adenopathy in the pelvis or periaortic retroperitoneum. 4. No evidence of visceral metastasis. Electronically Signed   By: Suzy Bouchard M.D.   On: 01/05/2023 13:04   ECHOCARDIOGRAM COMPLETE  Result Date: 12/26/2022    ECHOCARDIOGRAM REPORT   Patient Name:   Matthew [name]. Date of Exam: 12/26/2022 Medical Rec #:  [id number]        Height:       72.0 in Accession #:    [id number]       Weight:       230.0 lb Date of Birth:  [birth year]         BSA:          2.261 m Patient Age:    60 years         BP:           116/76 mmHg Patient Gender: M                HR:           70 bpm. Exam Location:  Inpatient Procedure: 2D Echo, Color Doppler and Cardiac Doppler Indications:    I48.91* Unspeicified atrial fibrillation  History:        Patient has prior history of Echocardiogram examinations. Risk  Factors:Hypertension and Dyslipidemia.  Sonographer:    Phineas Douglas Referring Phys: [phone] [place]  1. Left ventricular ejection fraction, by estimation, is 55 to 60%. The left ventricle has normal function. The left ventricle has no regional wall motion abnormalities. Left ventricular diastolic parameters were normal.  2. Right ventricular systolic function is normal. The right ventricular size is normal.  3. Left atrial size was mildly dilated.  4. The mitral valve is normal in structure. No evidence of mitral  valve regurgitation. No evidence of mitral stenosis.  5. The aortic valve is tricuspid. There is mild calcification of the aortic valve. There is mild thickening of the aortic valve. Aortic valve regurgitation is not visualized. Aortic valve sclerosis is present, with no evidence of aortic valve stenosis.  6. Aortic dilatation noted. There is mild dilatation of the aortic root, measuring 39 mm. There is moderate dilatation of the ascending aorta, measuring 43 mm.  7. The inferior vena cava is normal in size with greater than 50% respiratory variability, suggesting right atrial pressure of 3 mmHg. FINDINGS  Left Ventricle: Left ventricular ejection fraction, by estimation, is 55 to 60%. The left ventricle has normal function. The left ventricle has no regional wall motion abnormalities. The left ventricular internal cavity size was normal in size. There is  no left ventricular hypertrophy. Left ventricular diastolic parameters were normal. Right Ventricle: The right ventricular size is normal. No increase in right ventricular wall thickness. Right ventricular systolic function is normal. Left Atrium: Left atrial size was mildly dilated. Right Atrium: Right atrial size was normal in size. Pericardium: There is no evidence of pericardial effusion. Mitral Valve: The mitral valve is normal in structure. No evidence of mitral valve regurgitation. No evidence of mitral valve stenosis. Tricuspid Valve: The tricuspid valve is normal in structure. Tricuspid valve regurgitation is not demonstrated. No evidence of tricuspid stenosis. Aortic Valve: The aortic valve is tricuspid. There is mild calcification of the aortic valve. There is mild thickening of the aortic valve. Aortic valve regurgitation is not visualized. Aortic valve sclerosis is present, with no evidence of aortic valve stenosis. Pulmonic Valve: The pulmonic valve was normal in structure. Pulmonic valve regurgitation is not visualized. No evidence of pulmonic  stenosis. Aorta: Aortic dilatation noted. There is mild dilatation of the aortic root, measuring 39 mm. There is moderate dilatation of the ascending aorta, measuring 43 mm. Venous: The inferior vena cava is normal in size with greater than 50% respiratory variability, suggesting right atrial pressure of 3 mmHg. IAS/Shunts: No atrial level shunt detected by color flow Doppler.  LEFT VENTRICLE PLAX 2D LVIDd:         4.50 cm      Diastology LVIDs:         2.70 cm      LV e' medial:    6.09 cm/s LV PW:         1.30 cm      LV E/e' medial:  14.8 LV IVS:        1.30 cm      LV e' lateral:   10.90 cm/s LVOT diam:     2.40 cm      LV E/e' lateral: 8.3 LV SV:         77 LV SV Index:   34 LVOT Area:     4.52 cm  LV Volumes (MOD) LV vol d, MOD A2C: 104.0 ml LV vol d, MOD A4C: 137.0 ml LV vol s, MOD A2C: 42.9 ml  LV vol s, MOD A4C: 57.4 ml LV SV MOD A2C:     61.1 ml LV SV MOD A4C:     137.0 ml LV SV MOD BP:      71.4 ml RIGHT VENTRICLE             IVC RV Basal diam:  3.80 cm     IVC diam: 2.00 cm RV S prime:     19.10 cm/s TAPSE (M-mode): 2.3 cm LEFT ATRIUM             Index        RIGHT ATRIUM           Index LA diam:        3.90 cm 1.72 cm/m   RA Area:     22.00 cm LA Vol (A2C):   53.7 ml 23.75 ml/m  RA Volume:   60.90 ml  26.94 ml/m LA Vol (A4C):   84.6 ml 37.42 ml/m LA Biplane Vol: 73.4 ml 32.46 ml/m  AORTIC VALVE LVOT Vmax:   105.00 cm/s LVOT Vmean:  63.800 cm/s LVOT VTI:    0.170 m  AORTA Ao Root diam: 3.90 cm Ao Asc diam:  4.30 cm MITRAL VALVE MV Area (PHT): 3.56 cm    SHUNTS MV Decel Time: 213 msec    Systemic VTI:  0.17 m MV E velocity: 90.00 cm/s  Systemic Diam: 2.40 cm MV A velocity: 93.50 cm/s MV E/A ratio:  0.96 Matthew Rouge MD Electronically signed by Matthew Rouge MD Signature Date/Time: 12/26/2022/3:01:11 PM    Final    CT Renal Stone Study  Result Date: 12/25/2022 CLINICAL DATA:  Abdominal and flank pain, right-sided. EXAM: CT ABDOMEN AND PELVIS WITHOUT CONTRAST TECHNIQUE: Multidetector CT imaging of  the abdomen and pelvis was performed following the standard protocol without IV contrast. RADIATION DOSE REDUCTION: This exam was performed according to the departmental dose-optimization program which includes automated exposure control, adjustment of the mA and/or kV according to patient size and/or use of iterative reconstruction technique. COMPARISON:  CT abdomen and pelvis 01/12/2022 FINDINGS: Lower chest: No acute abnormality. Hepatobiliary: No focal liver abnormality is seen. No gallstones, gallbladder wall thickening, or biliary dilatation. Pancreas: Unremarkable. No pancreatic ductal dilatation or surrounding inflammatory changes. Spleen: Normal in size without focal abnormality. Adrenals/Urinary Tract: There is no hydronephrosis. There is marked right renal cortical scarring and atrophy, unchanged. There are innumerable cysts and mildly hyperdense lesions in both kidneys similar to the prior study. The largest hyperdense lesion is in the right kidney measuring 5.7 cm. Additionally there are nonobstructing right renal calculi measuring up to 13 mm. The adrenal glands and bladder are within normal limits. Stomach/Bowel: No evidence of bowel wall thickening, distention, or inflammatory changes. There is sigmoid colon diverticulosis. The appendix is not visualized. There is a large amount of stool throughout the colon. There is a small hiatal hernia. The stomach is otherwise within normal limits. Vascular/Lymphatic: Aortic atherosclerosis. No enlarged abdominal or pelvic lymph nodes. Reproductive: Prostate gland is small or surgically absent. Other: There is intramuscular right retroperitoneal hematoma. The right psoas muscle is enlarged measuring up to 8.0 x 8.0 cm image 2/42. There is some mild fluid and stranding tracking along the right retroperitoneum as well. There are small fat containing inguinal hernias, left greater than right. There is no ascites or focal abdominal wall hernia. Musculoskeletal:  Again seen are scattered sclerotic osseous lesions. Similar mildly increased in size. Multilevel degenerative changes affect the spine. IMPRESSION: 1. Right retroperitoneal hematoma.  2. Stable right renal cortical scarring and atrophy. 3. Nonobstructing right renal calculi. 4. Colonic diverticulosis. 5. Osseous metastatic disease, mildly increased. 6. Right Bosniak II benign renal cyst measuring 5.7 cm. No follow-up imaging is recommended. JACR 2018 Feb; 264-273, Management of the Incidental Renal Mass on CT, [employer] 2021; [id number], Bosniak Classification of Cystic Renal Masses, Version 2019. Aortic Atherosclerosis (ICD10-I70.0). Electronically Signed   By: Ronney Asters M.D.   On: 12/25/2022 19:33   DG Lumbar Spine Complete  Result Date: 12/25/2022 CLINICAL DATA:  Pain.  No reported history of injury. EXAM: LUMBAR SPINE - COMPLETE 4+ VIEW COMPARISON:  CT evaluation of January 12, 2022 FINDINGS: EKG leads project over the abdomen. Large RIGHT renal calculus incidentally noted not substantially changed compared to previous imaging measuring approximately 16 mm in the area of the lower pole the RIGHT kidney. Five lumbar type vertebral bodies. Degenerative changes throughout the lumbar spine greatest at L4-5 and L5-S1 with grade 1 anterolisthesis of L4 on L5 and L5 on S1 similar to imaging from February 2023. Mild-to-moderate degenerative changes elsewhere in the lumbar spine. Subtle retrolisthesis of L1 on L2 also similar to previous imaging. IMPRESSION: Degenerative changes throughout the spine greatest at L4-5 and L5-S1 associated with anterolisthesis at these levels, not changed since previous imaging. Large RIGHT renal calculus.  Correlate with signs of renal colic. Electronically Signed   By: Zetta Bills M.D.   On: 12/25/2022 18:14   DG Hip Unilat With Pelvis 2-3 Views Right  Result Date: 12/25/2022 CLINICAL DATA:  Pain EXAM: DG HIP (WITH OR WITHOUT PELVIS) 3V RIGHT COMPARISON:  None Available.  FINDINGS: There is no evidence of hip fracture or dislocation. There is no evidence of arthropathy or other focal bone abnormality. Osteopenia. Surgical clips are seen in the pelvis. There is a sclerotic focus along the right supra-acetabular region. This is increased from prior CT scan August 2022. There is also a sclerotic focus along the right inferior pubic ramus and some subtle areas elsewhere. History of prostate cancer. Overlapping cardiac leads. IMPRESSION: 1. No acute fracture or dislocation. 2. Osteopenia. 3. Sclerotic foci along the right supra-acetabular region and right inferior pubic ramus. These are increased from prior CT scan. History of prostate cancer. Known sclerotic bone metastases. Please correlate with prior workup. No fracture or dislocation. Electronically Signed   By: Jill Side M.D.   On: 12/25/2022 18:13       IMPRESSION/PLAN: 1. 82 y.o. gentleman with oligometastatic prostate cancer involving a solitary, left posterior 12th rib. Today, we talked to the patient and his wife about the findings and workup thus far. We discussed the natural history of oligometastatic prostate cancer and general treatment, highlighting the role of focused stereotactic radiotherapy in the management of limited osseous metastases which he is familiar with, having previously received SBRT to the oligometastatic disease in the right hemipelvis in July 2022.  Similarly, we would recommend a 5 fraction course of stereotactic body radiotherapy (SBRT) targeting the solitary osseous metastasis in the left posterior 12th rib.  We reviewed the available radiation techniques, and focused on the details and logistics of delivery as well as the anticipated acute and late sequelae associated with radiation in this setting. The patient and his wife were encouraged to ask questions that were answered to their stated satisfaction.  At the end of our conversation, the patient is in agreement to proceed with the  recommended 5 fraction course of stereotactic body radiotherapy (SBRT) targeting the solitary osseous metastasis in the  left posterior 12th rib. He appears to have a good understanding of his disease and our treatment recommendations which are of curative intent.  He has freely signed written consent to proceed today in the office and a copy of this document will be placed in his medical record.  He he is tentatively scheduled for CT simulation/treatment planning at 11 AM on Friday, 01/18/2023, in anticipation of beginning his treatments in the near future.  We will share our discussion with Dr. Jeffie Pollock and move forward with treatment planning accordingly.  We enjoyed meeting with the patient and his wife again today and look forward to continuing to participate in his care.  They know they are welcome to call at anytime with any questions or concerns related to the radiotherapy.  2. History of Small, nonspecific pulmonary nodules- this was an incidental finding on his initial PSMA scan in June 2022.  Fortunately, on his most recent PSMA PET scan from 01/04/2023, there are no suspicious pulmonary nodules or lymphadenopathy in the chest.  We personally spent 45 minutes in this encounter including chart review, reviewing radiological studies, meeting face-to-face with the patient, entering orders and completing documentation.    Nicholos Johns, PA-C    Tyler Pita, MD  [facility] Direct Dial: [phone]  Fax: [phone] [email or web address]  Skype  LinkedIn

## 2023-01-17 NOTE — Progress Notes (Signed)
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  Patient identity verified. Patient reports no rib or chest discomfort at this time.  Meaningful use complete.  BP 118/72 (BP Location: Left Arm, Patient Position: Sitting, Cuff Size: Large)   Pulse 79   Temp 97.9 F (36.6 C)   Resp 20   Ht 6' (1.829 m)   Wt 232 lb 6.4 oz (105.4 kg)   SpO2 98%   BMI 31.52 kg/m   This concludes the interview.   Leandra Kern, LPN

## 2023-01-18 ENCOUNTER — Ambulatory Visit
Admission: RE | Admit: 2023-01-18 | Discharge: 2023-01-18 | Disposition: A | Discharge status: Home / Self Care | Payer: Medicare PPO | Source: Ambulatory Visit | Attending: Radiation Oncology | Admitting: Radiation Oncology

## 2023-01-18 DIAGNOSIS — C7951 Secondary malignant neoplasm of bone: Secondary | ICD-10-CM | POA: Insufficient documentation

## 2023-01-18 DIAGNOSIS — Z510 Encounter for antineoplastic radiation therapy: Secondary | ICD-10-CM | POA: Diagnosis not present

## 2023-01-18 DIAGNOSIS — C61 Malignant neoplasm of prostate: Secondary | ICD-10-CM

## 2023-01-18 NOTE — Progress Notes (Signed)
  Radiation Oncology         [phone]) [phone] ________________________________  Name: [name]. MRN: [id number]  Date: 01/18/2023  DOB: 02-Aug-1941  STEREOTACTIC BODY RADIOTHERAPY SIMULATION AND TREATMENT PLANNING NOTE    ICD-10-CM   1. Malignant neoplasm of prostate metastatic to bone [facility])  C61    C79.51       DIAGNOSIS:   82 y.o. gentleman with oligometastatic prostate cancer involving a solitary, left posterior 12th rib.   NARRATIVE:  The patient was brought to the [facility].  Identity was confirmed.  All relevant records and images related to the planned course of therapy were reviewed.  The patient freely provided informed written consent to proceed with treatment after reviewing the details related to the planned course of therapy. The consent form was witnessed and verified by the simulation staff.  Then, the patient was set-up in a stable reproducible  supine position for radiation therapy.  A BodyFix immobilization pillow was fabricated for reproducible positioning.  Surface markings were placed.  The CT images were loaded into the planning software.  The gross target volumes (GTV) and planning target volumes (PTV) were delinieated, and avoidance structures were contoured.  Treatment planning then occurred.  The radiation prescription was entered and confirmed.  A total of two complex treatment devices were fabricated in the form of the BodyFix immobilization pillow and a neck accuform cushion.  I have requested : 3D Simulation  I have requested a DVH of the following structures: targets and all normal structures near the target including spinal cord, lung, esophagus, chest wall and skin as noted on the radiation plan to maintain doses in adherence with established limits  SPECIAL TREATMENT PROCEDURE:  The planned course of therapy using radiation constitutes a special treatment procedure. Special care is required in the management of this patient for the following  reasons. High dose per fraction requiring special monitoring for increased toxicities of treatment including daily imaging..  The special nature of the planned course of radiotherapy will require increased physician supervision and oversight to ensure patient's safety with optimal treatment outcomes.    This requires extended time and effort.    PLAN:  The patient will receive 40 Gy in 5 fractions.  ________________________________  Sheral Apley Tammi Klippel, M.D.

## 2023-01-20 DIAGNOSIS — I455 Other specified heart block: Secondary | ICD-10-CM | POA: Diagnosis not present

## 2023-01-21 DIAGNOSIS — C61 Malignant neoplasm of prostate: Secondary | ICD-10-CM | POA: Diagnosis not present

## 2023-01-21 DIAGNOSIS — C7951 Secondary malignant neoplasm of bone: Secondary | ICD-10-CM | POA: Diagnosis not present

## 2023-01-21 NOTE — Progress Notes (Signed)
Follow up visit  Subjective:   [name]., male    DOB: [birth year], 82 y.o.   MRN: [id number]    HPI  Chief Complaint  Patient presents with   Congestive Heart Failure   Follow-up   Results    82 y.o.  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  Patient is currently undergoing radiation therapy with Dr. Tammi Klippel for solitary mets to the rib.  He was placed on cardiac telemetry asymptomatic status post hospitalized 02/20/2023. Reviewed recent test results with the patient, details below.  Patient denies chest pain, shortness of breath, palpitations, leg edema, orthopnea, PND, TIA/syncope.   Current Outpatient Medications:    allopurinol (ZYLOPRIM) 300 MG tablet, Take 300 mg by mouth daily. , Disp: , Rfl:    atorvastatin (LIPITOR) 20 MG tablet, Take 20 mg by mouth daily., Disp: , Rfl:    esomeprazole (NEXIUM) 20 MG capsule, Take 20 mg by mouth daily. , Disp: , Rfl:    Multiple Vitamin (MULTIVITAMIN WITH MINERALS) TABS tablet, Take 1 tablet by mouth daily. Men +, Disp: , Rfl:    potassium chloride SA (KLOR-CON M) 20 MEQ tablet, TAKE 1 TABLET EVERY DAY, Disp: 90 tablet, Rfl: 3   Torsemide 40 MG TABS, Take 40 mg by mouth 2 (two) times daily. (Patient taking differently: Take 40-80 mg by mouth See admin instructions. Taking 80 mg in the morning and 40 mg in the evening), Disp: 180 tablet, Rfl: 3  Cardiovascular & other pertient studies:  Mobile cardiac telemetry 7 days 12/28/2022 - 01/04/2023: Dominant rhythm: Sinus. HR 49-110 bpm. Avg HR 77 bpm, in sinus rhythm. >11,000 episodes of SVT/atrial tachycardia, fastest at 150 bpm for 32.5 secs, longest for 36.6 secs at 107 bpm. 3.3% isolated SVE, 2.9%  couplets, 2.2% triplets. 1 episodes of VT, at 138 bpm for 4 beats. 1.8% isolated VE, <1% couplets. No definite evidence of Afib/Aflutter noted. 6 episodes of sinus pauses,longest at 3.3 secs, generally between midnight and 8 AM. No high grade AV block noted. 0 patient triggered events.   EKG 12/19/2022: Sinus rhythm 61 bpm with rate variation First degree A-V block  Cardioversion 08/08/2021: [id number] Successful  Echocardiogram 07/11/2021:  1. Left ventricular ejection fraction, by estimation, is 55 to 60%. The  left ventricle has normal function. The left ventricle has no regional  wall motion abnormalities. There is mild left ventricular hypertrophy.  Left ventricular diastolic parameters are indeterminate.   2. Right ventricular systolic function is normal. The right ventricular  size is normal. There is normal pulmonary artery systolic pressure.   3. The mitral valve is normal in structure. No evidence of mitral valve  regurgitation. No evidence of mitral stenosis.   4. The aortic valve is normal in structure. Aortic valve regurgitation is  not visualized. No aortic stenosis is present.   5. Aortic dilatation noted. There is mild dilatation of the ascending  aorta, measuring 42 mm.   6. The inferior vena cava is dilated in size with <50% respiratory  variability, suggesting right atrial pressure of 15 mmHg.   EKG 07/18/2021: Atrial flutter with 4:1 AV block Ventricular rate 68 bpm    Recent labs: 12/27/2022: Glucose 111, BUN/Cr 28/1.77. EGFR 38. Na/K 139/3.6.  H/H 11.6/36.2. MCV 100. Platelets 248  08/10/2022: Glucose  111, BUN/Cr 35/1.9. EGFR 35. Na/K 143/3.8.  Chol 142, TG 108, HDL 41, LDL 81  07/12/2021: Glucose 102, BUN/Cr 18/1.26. EGFR 58. Na/K 143/3.7.  BNP 208 H/H 12/38. MCV 96. Platelets 237 TSH 2.3 normal HbA1C N/A Lipid panel N/A    Review of Systems  Constitutional: Negative for fever.  Cardiovascular:  Positive for leg swelling (stable). Negative for chest  pain, dyspnea on exertion, palpitations and syncope.  Respiratory:  Positive for snoring.   Neurological:  Positive for light-headedness (intermittent).         Vitals:   01/24/23 1431  BP: 134/88  Pulse: 78  Resp: 16  SpO2: 97%    Body mass index is 31.33 kg/m. Filed Weights   01/24/23 1431  Weight: 231 lb (104.8 kg)     Objective:   Physical Exam Vitals and nursing note reviewed.  Constitutional:      General: He is not in acute distress. Neck:     Vascular: No JVD.  Cardiovascular:     Rate and Rhythm: Normal rate and regular rhythm.     Heart sounds: Normal heart sounds. No murmur heard. Pulmonary:     Effort: Pulmonary effort is normal.     Breath sounds: Normal breath sounds. No wheezing or rales.  Musculoskeletal:     Right lower leg: Edema (1+) present.     Left lower leg: Edema (1+) present.         Assessment & Recommendations:   82 y.o. 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  Arrhythmia: Multiple arrhythmias noted over years.  He had paroxysmal atrial flutter during hospital admission with SIRS in 07/2021, asymptomatic sinus pauses-always during sleep hours physician in 12/2022 with spontaneous retroperitoneal hematoma, multiple episodes of SVT as well as sinus pauses during cardiac telemetry in 01/2023. There is no obvious reversible cardiopulmonary cause for this arrhythmia. I have referred him to EP to discuss further these rhythms conservatively, or consider therapy. With no recurrence of atrial flutter, and spontaneous retroperitoneal hematoma in 12/2022, he remains off anticoagulation.  Leg edema: Chronic leg edema without any other obvious signs of heart failure.    Continue torsemide. Continue follow-up with nephrology.  .  Hypertension: Controlled   F/u in 3 months   [employer], PA-C 01/21/2023, 4:00 PM Office: [phone]

## 2023-01-21 NOTE — Therapy (Signed)
OUTPATIENT PHYSICAL THERAPY TREATMENT   Patient Name: Matthew [name]. MRN: [id number] DOB:October 27, 1941, 82 y.o., male Today's Date: 01/22/2023  END OF SESSION:  PT End of Session - 01/22/23 0807     Visit Number 6    Date for PT Re-Evaluation 02/13/23    Authorization Type [employer] MCR    Authorization Time Period 01/02/23-02/13/23    Authorization - Number of Visits 12    Progress Note Due on Visit 10    PT Start Time 0806    PT Stop Time 0844    PT Time Calculation (min) 38 min    Activity Tolerance Patient tolerated treatment well    Behavior During Therapy [facility] for tasks assessed/performed               Past Medical History:  Diagnosis Date   Arthritis    Barrett's esophagus    Cancer ([place]) 2004   prostate    Dysrhythmia    GERD (gastroesophageal reflux disease)    pt denies   History of kidney stones    Hyperlipidemia    Hypertension    Pneumonia    In high school   Prostate cancer [facility])    Past Surgical History:  Procedure Laterality Date   CARDIOVERSION N/A 08/08/2021   Procedure: CARDIOVERSION;  Surgeon: Nigel Mormon, MD;  Location: [place];  Service: Cardiovascular;  Laterality: N/A;   COLONOSCOPY WITH PROPOFOL N/A 01/09/2016   Procedure: COLONOSCOPY WITH PROPOFOL;  Surgeon: Garlan Fair, MD;  Location: [facility];  Service: Endoscopy;  Laterality: N/A;   CYSTOSCOPY     x 1   CYSTOSCOPY WITH RETROGRADE PYELOGRAM, URETEROSCOPY AND STENT PLACEMENT Left 03/30/2022   Procedure: CYSTOSCOPY WITH LEFT RETROGRADE PYELOGRAM, URETEROSCOPY AND STENT PLACEMENT;  Surgeon: Irine Seal, MD;  Location: [facility];  Service: Urology;  Laterality: Left;  1 HR FOR CASE   ESOPHAGOGASTRODUODENOSCOPY (EGD) WITH PROPOFOL N/A 01/09/2016   Procedure: ESOPHAGOGASTRODUODENOSCOPY (EGD) WITH PROPOFOL;  Surgeon: Garlan Fair, MD;  Location: [facility];  Service: Endoscopy;  Laterality: N/A;   EXTRACORPOREAL SHOCK WAVE LITHOTRIPSY Left 01/25/2022   Procedure: LEFT  EXTRACORPOREAL SHOCK WAVE LITHOTRIPSY (ESWL);  Surgeon: Vira Agar, MD;  Location: [facility];  Service: Urology;  Laterality: Left;   EYE SURGERY     bil cataract   KNEE ARTHROSCOPY Left 25 yrs ago   PARATHYROIDECTOMY N/A 02/25/2020   Procedure: NECK EXPLORATION WITH PARATHYROIDECTOMY;  Surgeon: Armandina Gemma, MD;  Location: [facility];  Service: General;  Laterality: N/A;   PROSTATECTOMY  2004   Patient Active Problem List   Diagnosis Date Noted   Chronic kidney disease, stage 3a ([place]) - Baseline Scr 1.5-1.9 12/26/2022   Gastro-esophageal reflux disease with esophagitis 12/26/2022   Paroxysmal atrial fibrillation ([place]) 12/26/2022   Retroperitoneal hematoma 12/25/2022   Typical atrial flutter ([place]) 07/18/2021   Chronic heart failure with preserved ejection fraction ([place]) 07/18/2021   Essential hypertension 07/11/2021   Malignant neoplasm of prostate metastatic to bone ([place]) 04/25/2021   Primary hyperparathyroidism ([place]) 02/25/2020    PCP: Wenda Low, MD   REFERRING PROVIDER: Wenda Low, MD   REFERRING DIAG: [phone] (ICD-10-CM) - Retroperitoneal hematoma   THERAPY DIAG:  Muscle weakness (generalized)  Unsteadiness on feet  Pain in right hip  Other muscle spasm  Other low back pain  Rationale for Evaluation and Treatment: Rehabilitation  ONSET DATE: 12/25/22  SUBJECTIVE:   SUBJECTIVE STATEMENT: I haven't had any issues with my hip in a while. I'm  sore in my knees. I've been doing the bike at home. My feet feel much better.      NEXT MD VISIT: 2/15 Oncologist, starting radiation treatment  PERTINENT HISTORY: Metatastic prostate CA to bone, HTN, Afib, he has had radiation on R hip 2 years ago, bil knee arthritis.  PAIN:  Are you having pain? Yes: NPRS scale: 6/10 Pain location: bil LE knees down Pain description: sore  PRECAUTIONS: None  WEIGHT BEARING RESTRICTIONS: No  FALLS:  Has patient fallen in last 6 months? No  LIVING  ENVIRONMENT: Lives with: lives with their spouse Lives in: House/apartment Stairs: Yes: External: 5 steps; can reach both Has following equipment at home: Single point cane and Walker - 4 wheeled  OCCUPATION: desk work  PLOF: Independent  PATIENT GOALS: get back into exercising and get some stretching and strength    OBJECTIVE:   DIAGNOSTIC FINDINGS:  CT renal stone was done which showed a retroperitoneal intramuscular hematoma of the right psoas muscle measuring 8 x 8 cm   PATIENT SURVEYS:  ABC scale 55% (01/08/23) LEFS  32 / 80 = 40.0 %  COGNITION: Overall cognitive status: Within functional limits for tasks assessed     SENSATION: WFL   EDEMA:  Reports swelling bil feet on meds  MUSCLE LENGTH: [id number] R, mod L Quads: bil tightness ITB: mod R Piriformis: marked R, mild L Hip Flexors: bil tightness Heelcords:   POSTURE: rounded shoulders, forward head, and increased thoracic kyphosis  PALPATION: Palpation: TTP at R QL. Increased tissue tension in R lumbar   LOWER EXTREMITY MMT  MMT Right eval Left eval  Hip flexion 3 5  Hip extension 5 4+  Hip abduction 5 4+  Hip adduction 5 5  Hip internal rotation    Hip external rotation    Knee flexion 5 5  Knee extension 5 5  Ankle dorsiflexion 5 5   (Blank rows = not tested)  LUMBAR ROM: flex WFL, ext mild pain in mid back WFL, bil rot 75% left tighter, bil SB WFL feels on right with left SB  LOWER EXTREMITY ROM:  WFL  FUNCTIONAL TESTS:  5 times sit to stand: 14.98 sec uses bil hands lightly Berg Balance Scale: 49/56 Dynamic Gait Index: 19/24  GAIT: Distance walked: 40 ft Assistive device utilized: Single point cane Level of assistance: Modified independence Comments: decreased heel strike, step length R   TODAY'S TREATMENT:                                                                                                                              DATE:    01/22/2023 Therapeutic Exercise: to  improve strength and mobility.  Demo, verbal and tactile cues throughout for technique. Nustep L5 x 6 min   Neuromuscular Reeducation: to improve balance and stability. SBA for safety throughout.  Star excursion pattern with CGA all directions, cues for posture, mirror for feedback, progressed to calling random feet and positions.  Cone taps - alternating feet, then with sidestepping x 4 cones down and back x 4 Cone tip and replace to promote SLS SLS with one UE support - still challenging Semi tandem stance x 30 sec bil Standing marching with 8# in bil hands Standing marching with 10# weight in one hand for core strengthening x 10 ea side Balance board M/L x 2 min side to side, then balance with 4 # wt reach outs, with jumping jack arms x 10, with cross body reaches to cones x 5 ea   01/17/2023 Therapeutic Exercise: to improve strength and mobility.  Demo, verbal and tactile cues throughout for technique. Nustep L5 x 6 min  LTR x 10 bil Bridges x20  SLR 2 x 10 bil  S/L SLR 2 x 10 bil  Prone hamstring curls 2 x 10 bil Prone hip extension x 10 bil  Manual Therapy: to decrease muscle spasm and pain and improve mobility IASTM to bil calves and plantar fascia bil feet, retrograde massage bil calves and ant tib.   01/15/2023 Therapeutic Exercise: to improve strength and mobility.  Demo, verbal and tactile cues throughout for technique. Nustep L6 x 7 min  Leg extensions 25# 2 x 10  Hamstring curls 25# 2 x 10  Neuromuscular Reeducation: to improve balance and stability. SBA for safety throughout.  Star excursion pattern with CGA all directions, cues for posture, mirror for feedback, progressed to calling random feet and positions.  Cone taps - alternating feet Cone tip and replace to promote SLS Balance board M/L x 2 min side to side, then 1 min hold - cues for posture Tandem stance x 1 min bil   01/11/2023 Therapeutic Exercise: to improve strength and mobility.  Demo, verbal and tactile  cues throughout for technique. Nustep L6 x 7 min  Leg extensions 20# 2 x 10  Hamstring curl 20# 2 x 10  Leg Press 25# x 10 - difficulty keeping feet on plate.  Neuromuscular Reeducation: to improve balance and stability. SBA for safety throughout.  On Aire[employer]: Eyes open feet apart x 30 sec  Eyes open feet apart with head nods x 10 Eyes open feet apart with head turns x 10 Eyes closed feet apart x 30 sec  On level surface: Eyes open feet together x 30 sec  Eyes open feet together with head nods x 10 Eyes open feet together with head turns x 10 Eyes closed feet together x 30 sec Eyes closed feet together with head nods x 10 Eyes closed feet together with head turns x 10  Tandem Stance x 30 sec bil    PATIENT EDUCATION:  Education details: HEP update  Person educated: Patient Education method: Explanation, Demonstration, Tactile cues, Verbal cues, and Handouts Education comprehension: verbalized understanding and returned demonstration  HOME EXERCISE PROGRAM: Access Code: [email or web address] URL: [email or web address] Date: 01/11/2023 Prepared by: Glenetta Hew  Exercises Added  - Standing Balance in Corner  - 1 x daily - 7 x weekly - Standing Balance in Corner with Eyes Closed  - 1 x daily - 7 x weekly - Tandem Stance in Corner  - 1 x daily - 7 x weekly  ASSESSMENT:  CLINICAL IMPRESSION: Aleksy continues to be very challenged with SLS activities although he is showing improvement. He will benefit from continued core strengthening to assist with balance deficits.   OBJECTIVE IMPAIRMENTS: Abnormal gait, decreased ROM, decreased strength, increased fascial restrictions, increased muscle spasms, impaired flexibility, and pain.   ACTIVITY LIMITATIONS: squatting, stairs, bed mobility,  dressing, and locomotion level  PARTICIPATION LIMITATIONS: driving  PERSONAL FACTORS: 3+ comorbidities: Metatastic prostate CA to bone, HTN, Afib, he has had radiation on R hip 2 years  ago, bil knee arthritis.  are also affecting patient's functional outcome.   REHAB POTENTIAL: Excellent  CLINICAL DECISION MAKING: Stable/uncomplicated  EVALUATION COMPLEXITY: Low   GOALS: Goals reviewed with patient? Yes  SHORT TERM GOALS: Target date: 01/16/2023  Patient will be independent with initial HEP. Baseline:  Goal status: IN PROGRESS  2.  Balance assessment completed including review of ABC Scale, TUG and BERG Baseline:  Goal status: MET   LONG TERM GOALS: Target date: 02/13/2023  Patient will be independent with advanced/ongoing HEP to improve outcomes and carryover.  Baseline:  Goal status: IN PROGRESS  2.  Patient will report at least 75% improvement in R hip pain to improve QOL. Baseline:  Goal status: IN PROGRESS  3.  Patient will demonstrate improved functional LE strength as demonstrated by ability to perform 5xSTS without UE support in <= 12 sec. Baseline: 14.98 sec uses bil hands lightly Goal status: IN PROGRESS  4.  Patient will be able to ambulate 600' and negotiate curbs with LRAD and normal gait pattern without increased pain to access community.  Baseline:  Goal status: IN PROGRESS  5.  Patient will report >= 41/80 on LEFS to demonstrate improved functional ability. Baseline: LEFS  32 / 80 = 40.0 % Goal status: IN PROGRESS  6.  Patient will demonstrate 22/24 on DGI to decrease risk of falls. Baseline: 01/08/2023 -19/24 Goal status: IN PROGRESS     PLAN:  PT FREQUENCY: 2x/week  PT DURATION: 6 weeks  PLANNED INTERVENTIONS: Therapeutic exercises, Therapeutic activity, Neuromuscular re-education, Balance training, Gait training, Patient/Family education, Self Care, Joint mobilization, Stair training, Dry Needling, Electrical stimulation, Spinal mobilization, Cryotherapy, Moist heat, Taping, Ionotophoresis 19m/ml Dexamethasone, Manual therapy, and Re-evaluation  PLAN FOR NEXT SESSION: review HEP and add ITB stretch, manual therapy/DN to R  quads, lumbar, ITB as indicated. Work on balance, stepping over obstacles, narrow BOS, gait with head turns   Nahsir Venezia, PT  01/22/2023, 8:47 AM

## 2023-01-22 ENCOUNTER — Encounter: Payer: Self-pay | Admitting: Physical Therapy

## 2023-01-22 ENCOUNTER — Ambulatory Visit: Payer: Medicare PPO | Admitting: Physical Therapy

## 2023-01-22 DIAGNOSIS — M62838 Other muscle spasm: Secondary | ICD-10-CM

## 2023-01-22 DIAGNOSIS — Z510 Encounter for antineoplastic radiation therapy: Secondary | ICD-10-CM | POA: Diagnosis not present

## 2023-01-22 DIAGNOSIS — R2681 Unsteadiness on feet: Secondary | ICD-10-CM | POA: Diagnosis not present

## 2023-01-22 DIAGNOSIS — M5459 Other low back pain: Secondary | ICD-10-CM | POA: Diagnosis not present

## 2023-01-22 DIAGNOSIS — C7951 Secondary malignant neoplasm of bone: Secondary | ICD-10-CM | POA: Diagnosis not present

## 2023-01-22 DIAGNOSIS — M6281 Muscle weakness (generalized): Secondary | ICD-10-CM | POA: Diagnosis not present

## 2023-01-22 DIAGNOSIS — C61 Malignant neoplasm of prostate: Secondary | ICD-10-CM | POA: Diagnosis not present

## 2023-01-22 DIAGNOSIS — M25551 Pain in right hip: Secondary | ICD-10-CM

## 2023-01-24 ENCOUNTER — Ambulatory Visit: Payer: Medicare PPO | Admitting: Cardiology

## 2023-01-24 ENCOUNTER — Encounter: Payer: Self-pay | Admitting: Cardiology

## 2023-01-24 VITALS — BP 134/88 | HR 78 | Resp 16 | Ht 72.0 in | Wt 231.0 lb

## 2023-01-24 DIAGNOSIS — I455 Other specified heart block: Secondary | ICD-10-CM

## 2023-01-24 DIAGNOSIS — I471 Supraventricular tachycardia, unspecified: Secondary | ICD-10-CM

## 2023-01-25 ENCOUNTER — Ambulatory Visit: Payer: Medicare PPO | Admitting: Physical Therapy

## 2023-01-25 ENCOUNTER — Encounter: Payer: Self-pay | Admitting: Physical Therapy

## 2023-01-25 ENCOUNTER — Encounter: Payer: Self-pay | Admitting: Cardiology

## 2023-01-25 DIAGNOSIS — M6281 Muscle weakness (generalized): Secondary | ICD-10-CM

## 2023-01-25 DIAGNOSIS — M5459 Other low back pain: Secondary | ICD-10-CM | POA: Diagnosis not present

## 2023-01-25 DIAGNOSIS — R2681 Unsteadiness on feet: Secondary | ICD-10-CM | POA: Diagnosis not present

## 2023-01-25 DIAGNOSIS — M62838 Other muscle spasm: Secondary | ICD-10-CM | POA: Diagnosis not present

## 2023-01-25 DIAGNOSIS — M25551 Pain in right hip: Secondary | ICD-10-CM | POA: Diagnosis not present

## 2023-01-25 NOTE — Therapy (Addendum)
OUTPATIENT PHYSICAL THERAPY TREATMENT   Patient Name: Matthew [name]. MRN: [id number] DOB:06/07/1941, 82 y.o., male Today's Date: 01/25/2023  END OF SESSION:  PT End of Session - 01/25/23 0809     Visit Number 7    Date for PT Re-Evaluation 02/13/23    Authorization Type HUMA[employer] MCR    Authorization Time Period 01/02/23-02/13/23    Authorization - Number of Visits 12    Progress Note Due on Visit 10    PT Start Time 0808    PT Stop Time 0849    PT Time Calculation (min) 41 min    Activity Tolerance Patient tolerated treatment well    Behavior During Therapy [facility] for tasks assessed/performed               Past Medical History:  Diagnosis Date   Arthritis    Barrett's esophagus    Cancer ([place]) 2004   prostate    Dysrhythmia    GERD (gastroesophageal reflux disease)    pt denies   History of kidney stones    Hyperlipidemia    Hypertension    Pneumonia    In high school   Prostate cancer [facility])    Past Surgical History:  Procedure Laterality Date   CARDIOVERSION N/A 08/08/2021   Procedure: CARDIOVERSION;  Surgeon: Nigel Mormon, MD;  Location: [place];  Service: Cardiovascular;  Laterality: N/A;   COLONOSCOPY WITH PROPOFOL N/A 01/09/2016   Procedure: COLONOSCOPY WITH PROPOFOL;  Surgeon: Garlan Fair, MD;  [facility];  Service: Endoscopy;  Laterality: N/A;   CYSTOSCOPY     x 1   CYSTOSCOPY WITH RETROGRADE PYELOGRAM, URETEROSCOPY AND STENT PLACEMENT Left 03/30/2022   Procedure: CYSTOSCOPY WITH LEFT RETROGRADE PYELOGRAM, URETEROSCOPY AND STENT PLACEMENT;  Surgeon: Irine Seal, MD;  [facility];  Service: Urology;  Laterality: Left;  1 HR FOR CASE   ESOPHAGOGASTRODUODENOSCOPY (EGD) WITH PROPOFOL N/A 01/09/2016   Procedure: ESOPHAGOGASTRODUODENOSCOPY (EGD) WITH PROPOFOL;  Surgeon: Garlan Fair, MD;  [facility];  Service: Endoscopy;  Laterality: N/A;   EXTRACORPOREAL SHOCK WAVE LITHOTRIPSY Left 01/25/2022   Procedure: LEFT  EXTRACORPOREAL SHOCK WAVE LITHOTRIPSY (ESWL);  Surgeon: Vira Agar, MD;  Location: [facility];  Service: Urology;  Laterality: Left;   EYE SURGERY     bil cataract   KNEE ARTHROSCOPY Left 25 yrs ago   PARATHYROIDECTOMY N/A 02/25/2020   Procedure: NECK EXPLORATION WITH PARATHYROIDECTOMY;  Surgeon: Armandina Gemma, MD;  [facility];  Service: General;  Laterality: N/A;   PROSTATECTOMY  2004   Patient Active Problem List   Diagnosis Date Noted   Chronic kidney disease, stage 3a ([place]) - Baseline Scr 1.5-1.9 12/26/2022   Gastro-esophageal reflux disease with esophagitis 12/26/2022   Paroxysmal atrial fibrillation ([place]) 12/26/2022   Retroperitoneal hematoma 12/25/2022   Typical atrial flutter ([place]) 07/18/2021   Chronic heart failure with preserved ejection fraction ([place]) 07/18/2021   Essential hypertension 07/11/2021   Malignant neoplasm of prostate metastatic to bone ([place]) 04/25/2021   Primary hyperparathyroidism ([place]) 02/25/2020    PCP: Wenda Low, MD   REFERRING PROVIDER: Wenda Low, MD   REFERRING DIAG: [phone] (ICD-10-CM) - Retroperitoneal hematoma   THERAPY DIAG:  Muscle weakness (generalized)  Unsteadiness on feet  Pain in right hip  Other muscle spasm  Other low back pain  Rationale for Evaluation and Treatment: Rehabilitation  ONSET DATE: 12/25/22  SUBJECTIVE:   SUBJECTIVE STATEMENT: Knees are sore, not much to do about them.  Started radiation  on Tuesday.  Goes Tuesday and Thursday next week, and then 3 days following week.      NEXT MD VISIT: 2/15 Oncologist, starting radiation treatment  PERTINENT HISTORY: Metatastic prostate CA to bone, HTN, Afib, he has had radiation on R hip 2 years ago, bil knee arthritis.  PAIN:  Are you having pain? Yes: NPRS scale: 5/10 Pain location: bil knees Pain description: sore  PRECAUTIONS: None  WEIGHT BEARING RESTRICTIONS: No  FALLS:  Has patient fallen in last 6 months? No  LIVING  ENVIRONMENT: Lives with: lives with their spouse Lives in: House/apartment Stairs: Yes: External: 5 steps; can reach both Has following equipment at home: Single point cane and Walker - 4 wheeled  OCCUPATION: desk work  PLOF: Independent  PATIENT GOALS: get back into exercising and get some stretching and strength    OBJECTIVE:   DIAGNOSTIC FINDINGS:  CT renal stone was done which showed a retroperitoneal intramuscular hematoma of the right psoas muscle measuring 8 x 8 cm   PATIENT SURVEYS:  ABC scale 55% (01/08/23) LEFS  32 / 80 = 40.0 %  COGNITION: Overall cognitive status: Within functional limits for tasks assessed     SENSATION: WFL   EDEMA:  Reports swelling bil feet on meds  MUSCLE LENGTH: [id number] R, mod L Quads: bil tightness ITB: mod R Piriformis: marked R, mild L Hip Flexors: bil tightness Heelcords:   POSTURE: rounded shoulders, forward head, and increased thoracic kyphosis  PALPATION: Palpation: TTP at R QL. Increased tissue tension in R lumbar   LOWER EXTREMITY MMT  MMT Right eval Left eval  Hip flexion 3 5  Hip extension 5 4+  Hip abduction 5 4+  Hip adduction 5 5  Hip internal rotation    Hip external rotation    Knee flexion 5 5  Knee extension 5 5  Ankle dorsiflexion 5 5   (Blank rows = not tested)  LUMBAR ROM: flex WFL, ext mild pain in mid back WFL, bil rot 75% left tighter, bil SB WFL feels on right with left SB  LOWER EXTREMITY ROM:  WFL  FUNCTIONAL TESTS:  5 times sit to stand: 14.98 sec uses bil hands lightly Berg Balance Scale: 49/56 Dynamic Gait Index: 19/24  GAIT: Distance walked: 40 ft Assistive device utilized: Single point cane Level of assistance: Modified independence Comments: decreased heel strike, step length R   TODAY'S TREATMENT:                                                                                                                              DATE:    01/25/2023 Therapeutic Exercise: to  improve strength and mobility.  Demo, verbal and tactile cues throughout for technique. Hamstring curls 25# 3 x 10  Knee extension 25# 3 x 10  Wall mini-squat for isometric quad strengthening 3 x 10 sec hold Forward flexion with ball to relax back x 10  Neuromuscular Reeducation: to improve balance and stability.  SBA for safety throughout.  Ball toss with gait x 200' Retro stepping x 100' Side stepping with ball toss 2 x 50' Tandem walking forward x 50', retro x 50' - 1 UE on wall needed.   01/22/2023 Therapeutic Exercise: to improve strength and mobility.  Demo, verbal and tactile cues throughout for technique. Nustep L5 x 6 min  Neuromuscular Reeducation: to improve balance and stability. SBA for safety throughout.  Star excursion pattern with CGA all directions, cues for posture, mirror for feedback, progressed to calling random feet and positions.  Cone taps - alternating feet, then with sidestepping x 4 cones down and back x 4 Cone tip and replace to promote SLS SLS with one UE support - still challenging Semi tandem stance x 30 sec bil Standing marching with 8# in bil hands Standing marching with 10# weight in one hand for core strengthening x 10 ea side Balance board M/L x 2 min side to side, then balance with 4 # wt reach outs, with jumping jack arms x 10, with cross body reaches to cones x 5 ea   01/17/2023 Therapeutic Exercise: to improve strength and mobility.  Demo, verbal and tactile cues throughout for technique. Nustep L5 x 6 min  LTR x 10 bil Bridges x20  SLR 2 x 10 bil  S/L SLR 2 x 10 bil  Prone hamstring curls 2 x 10 bil Prone hip extension x 10 bil  Manual Therapy: to decrease muscle spasm and pain and improve mobility IASTM to bil calves and plantar fascia bil feet, retrograde massage bil calves and ant tib.    PATIENT EDUCATION:  Education details: HEP update added wall squats.   Person educated: Patient Education method: Explanation, Demonstration, Tactile  cues, Verbal cues, and Handouts Education comprehension: verbalized understanding and returned demonstration  HOME EXERCISE PROGRAM: Access Code: [email or web address] URL: [email or web address] Date: 01/11/2023 Prepared by: Glenetta Hew  Exercises Added  - Standing Balance in Corner  - 1 x daily - 7 x weekly - Standing Balance in Corner with Eyes Closed  - 1 x daily - 7 x weekly - Tandem Stance in Corner  - 1 x daily - 7 x weekly  ASSESSMENT:  CLINICAL IMPRESSION: Focus of skilled therapy was on gait and balance today, very challenged with retro stepping, reporting more discomfort in back at end due to extension (has history of stenosis) and difficulty with balance in tandem, needing 1 UE on wall for balance.  Challenged with forward stepping and ball toss but no LOB.  Added wall squats to HEP for quad strengthening.  [name]. continues to demonstrate potential for improvement and would benefit from continued skilled therapy to address impairments.    OBJECTIVE IMPAIRMENTS: Abnormal gait, decreased ROM, decreased strength, increased fascial restrictions, increased muscle spasms, impaired flexibility, and pain.   ACTIVITY LIMITATIONS: squatting, stairs, bed mobility, dressing, and locomotion level  PARTICIPATION LIMITATIONS: driving  PERSONAL FACTORS: 3+ comorbidities: Metatastic prostate CA to bone, HTN, Afib, he has had radiation on R hip 2 years ago, bil knee arthritis.  are also affecting patient's functional outcome.   REHAB POTENTIAL: Excellent  CLINICAL DECISION MAKING: Stable/uncomplicated  EVALUATION COMPLEXITY: Low   GOALS: Goals reviewed with patient? Yes  SHORT TERM GOALS: Target date: 01/16/2023  Patient will be independent with initial HEP. Baseline:  Goal status: MET   2.  Balance assessment completed including review of ABC Scale, TUG and BERG Baseline:  Goal status: MET   LONG TERM GOALS: Target date: 02/13/2023  Patient will be  independent with advanced/ongoing HEP to improve outcomes and carryover.  Baseline:  Goal status: IN PROGRESS  2.  Patient will report at least 75% improvement in R hip pain to improve QOL. Baseline:  Goal status: MET 01/25/23- no pain in R hip.   3.  Patient will demonstrate improved functional LE strength as demonstrated by ability to perform 5xSTS without UE support in <= 12 sec. Baseline: 14.98 sec uses bil hands lightly Goal status: IN PROGRESS  4.  Patient will be able to ambulate 600' and negotiate curbs with LRAD and normal gait pattern without increased pain to access community.  Baseline:  Goal status: IN PROGRESS  5.  Patient will report >= 41/80 on LEFS to demonstrate improved functional ability. Baseline: LEFS  32 / 80 = 40.0 % Goal status: IN PROGRESS  6.  Patient will demonstrate 22/24 on DGI to decrease risk of falls. Baseline: 01/08/2023 -19/24 Goal status: IN PROGRESS     PLAN:  PT FREQUENCY: 2x/week  PT DURATION: 6 weeks  PLANNED INTERVENTIONS: Therapeutic exercises, Therapeutic activity, Neuromuscular re-education, Balance training, Gait training, Patient/Family education, Self Care, Joint mobilization, Stair training, Dry Needling, Electrical stimulation, Spinal mobilization, Cryotherapy, Moist heat, Taping, Ionotophoresis 4mg /ml Dexamethasone, Manual therapy, and Re-evaluation  PLAN FOR NEXT SESSION: . Work on balance, stepping over obstacles, narrow BOS, gait with head turns   Rennie Natter, PT, DPT 01/25/2023, 8:56 AM  PHYSICAL THERAPY DISCHARGE SUMMARY  Visits from Start of Care: 7  Current functional level related to goals / functional outcomes: R hip pain resolved, met STG, LTG #2    Remaining deficits: See above   Education / Equipment: HEP  Plan: Patient is being discharged due to not returning to skilled therapy after 01/25/2023 session.      Rennie Natter, PT, DPT 2:44 PM 02/28/2023

## 2023-01-28 NOTE — Therapy (Incomplete)
OUTPATIENT PHYSICAL THERAPY TREATMENT   Patient Name: Matthew [name]. MRN: [id number] DOB:1941-04-21, 82 y.o., male Today's Date: 01/25/2023  END OF SESSION:  PT End of Session - 01/25/23 0809     Visit Number 7    Date for PT Re-Evaluation 02/13/23    Authorization Type HU[employer] MCR    Authorization Time Period 01/02/23-02/13/23    Authorization - Number of Visits 12    Progress Note Due on Visit 10    PT Start Time 0808    PT Stop Time 0849    PT Time Calculation (min) 41 min    Activity Tolerance Patient tolerated treatment well    Behavior During Therapy [facility] for tasks assessed/performed               Past Medical History:  Diagnosis Date   Arthritis    Barrett's esophagus    Cancer ([place]) 2004   prostate    Dysrhythmia    GERD (gastroesophageal reflux disease)    pt denies   History of kidney stones    Hyperlipidemia    Hypertension    Pneumonia    In high school   Prostate cancer [facility])    Past Surgical History:  Procedure Laterality Date   CARDIOVERSION N/A 08/08/2021   Procedure: CARDIOVERSION;  Surgeon: Nigel Mormon, MD;  Location: [place];  Service: Cardiovascular;  Laterality: N/A;   COLONOSCOPY WITH PROPOFOL N/A 01/09/2016   Procedure: COLONOSCOPY WITH PROPOFOL;  Surgeon: Garlan Fair, MD;  [facility];  Service: Endoscopy;  Laterality: N/A;   CYSTOSCOPY     x 1   CYSTOSCOPY WITH RETROGRADE PYELOGRAM, URETEROSCOPY AND STENT PLACEMENT Left 03/30/2022   Procedure: CYSTOSCOPY WITH LEFT RETROGRADE PYELOGRAM, URETEROSCOPY AND STENT PLACEMENT;  Surgeon: Irine Seal, MD;  [facility];  Service: Urology;  Laterality: Left;  1 HR FOR CASE   ESOPHAGOGASTRODUODENOSCOPY (EGD) WITH PROPOFOL N/A 01/09/2016   Procedure: ESOPHAGOGASTRODUODENOSCOPY (EGD) WITH PROPOFOL;  Surgeon: Garlan Fair, MD;  [facility];  Service: Endoscopy;  Laterality: N/A;   EXTRACORPOREAL SHOCK WAVE LITHOTRIPSY Left 01/25/2022   Procedure: LEFT  EXTRACORPOREAL SHOCK WAVE LITHOTRIPSY (ESWL);  Surgeon: Vira Agar, MD;  Location: [facility];  Service: Urology;  Laterality: Left;   EYE SURGERY     bil cataract   KNEE ARTHROSCOPY Left 25 yrs ago   PARATHYROIDECTOMY N/A 02/25/2020   Procedure: NECK EXPLORATION WITH PARATHYROIDECTOMY;  Surgeon: Armandina Gemma, MD;  [facility];  Service: General;  Laterality: N/A;   PROSTATECTOMY  2004   Patient Active Problem List   Diagnosis Date Noted   Chronic kidney disease, stage 3a ([place]) - Baseline Scr 1.5-1.9 12/26/2022   Gastro-esophageal reflux disease with esophagitis 12/26/2022   Paroxysmal atrial fibrillation ([place]) 12/26/2022   Retroperitoneal hematoma 12/25/2022   Typical atrial flutter ([place]) 07/18/2021   Chronic heart failure with preserved ejection fraction ([place]) 07/18/2021   Essential hypertension 07/11/2021   Malignant neoplasm of prostate metastatic to bone ([place]) 04/25/2021   Primary hyperparathyroidism ([place]) 02/25/2020    PCP: Wenda Low, MD   REFERRING PROVIDER: Wenda Low, MD   REFERRING DIAG: [phone] (ICD-10-CM) - Retroperitoneal hematoma   THERAPY DIAG:  Muscle weakness (generalized)  Unsteadiness on feet  Pain in right hip  Other muscle spasm  Other low back pain  Rationale for Evaluation and Treatment: Rehabilitation  ONSET DATE: 12/25/22  SUBJECTIVE:   SUBJECTIVE STATEMENT: ***  Knees are sore, not much to do about them.  Started radiation on Tuesday.  Goes Tuesday and Thursday next week, and then 3 days following week.      NEXT MD VISIT: 2/15 Oncologist, starting radiation treatment  PERTINENT HISTORY: Metatastic prostate CA to bone, HTN, Afib, he has had radiation on R hip 2 years ago, bil knee arthritis.  PAIN:  Are you having pain? Yes: NPRS scale: 5/10 Pain location: bil knees Pain description: sore  PRECAUTIONS: None  WEIGHT BEARING RESTRICTIONS: No  FALLS:  Has patient fallen in last 6 months?  No  LIVING ENVIRONMENT: Lives with: lives with their spouse Lives in: House/apartment Stairs: Yes: External: 5 steps; can reach both Has following equipment at home: Single point cane and Walker - 4 wheeled  OCCUPATION: desk work  PLOF: Independent  PATIENT GOALS: get back into exercising and get some stretching and strength    OBJECTIVE:   DIAGNOSTIC FINDINGS:  CT renal stone was done which showed a retroperitoneal intramuscular hematoma of the right psoas muscle measuring 8 x 8 cm   PATIENT SURVEYS:  ABC scale 55% (01/08/23) LEFS  32 / 80 = 40.0 %  COGNITION: Overall cognitive status: Within functional limits for tasks assessed     SENSATION: WFL   EDEMA:  Reports swelling bil feet on meds  MUSCLE LENGTH: [id number] R, mod L Quads: bil tightness ITB: mod R Piriformis: marked R, mild L Hip Flexors: bil tightness Heelcords:   POSTURE: rounded shoulders, forward head, and increased thoracic kyphosis  PALPATION: Palpation: TTP at R QL. Increased tissue tension in R lumbar   LOWER EXTREMITY MMT  MMT Right eval Left eval  Hip flexion 3 5  Hip extension 5 4+  Hip abduction 5 4+  Hip adduction 5 5  Hip internal rotation    Hip external rotation    Knee flexion 5 5  Knee extension 5 5  Ankle dorsiflexion 5 5   (Blank rows = not tested)  LUMBAR ROM: flex WFL, ext mild pain in mid back WFL, bil rot 75% left tighter, bil SB WFL feels on right with left SB  LOWER EXTREMITY ROM:  WFL  FUNCTIONAL TESTS:  5 times sit to stand: 14.98 sec uses bil hands lightly Berg Balance Scale: 49/56 Dynamic Gait Index: 19/24  GAIT: Distance walked: 40 ft Assistive device utilized: Single point cane Level of assistance: Modified independence Comments: decreased heel strike, step length R   TODAY'S TREATMENT:                                                                                                                              DATE:    01/29/2023 Therapeutic  Exercise: to improve strength and mobility.  Demo, verbal and tactile cues throughout for technique. Hamstring curls 25# 3 x 10  Knee extension 25# 3 x 10  Wall mini-squat for isometric quad strengthening 3 x 10 sec hold Forward flexion with ball to relax back x 10  Neuromuscular Reeducation: to improve balance  and stability. SBA for safety throughout.  Ball toss with gait x 200' Retro stepping x 100' Side stepping with ball toss 2 x 50' Tandem walking forward x 50', retro x 50' - 1 UE on wall needed.   01/25/2023 Therapeutic Exercise: to improve strength and mobility.  Demo, verbal and tactile cues throughout for technique. Hamstring curls 25# 3 x 10  Knee extension 25# 3 x 10  Wall mini-squat for isometric quad strengthening 3 x 10 sec hold Forward flexion with ball to relax back x 10  Neuromuscular Reeducation: to improve balance and stability. SBA for safety throughout.  Ball toss with gait x 200' Retro stepping x 100' Side stepping with ball toss 2 x 50' Tandem walking forward x 50', retro x 50' - 1 UE on wall needed.   01/22/2023 Therapeutic Exercise: to improve strength and mobility.  Demo, verbal and tactile cues throughout for technique. Nustep L5 x 6 min  Neuromuscular Reeducation: to improve balance and stability. SBA for safety throughout.  Star excursion pattern with CGA all directions, cues for posture, mirror for feedback, progressed to calling random feet and positions.  Cone taps - alternating feet, then with sidestepping x 4 cones down and back x 4 Cone tip and replace to promote SLS SLS with one UE support - still challenging Semi tandem stance x 30 sec bil Standing marching with 8# in bil hands Standing marching with 10# weight in one hand for core strengthening x 10 ea side Balance board M/L x 2 min side to side, then balance with 4 # wt reach outs, with jumping jack arms x 10, with cross body reaches to cones x 5 ea   PATIENT EDUCATION:  Education details: HEP  update added wall squats.   Person educated: Patient Education method: Explanation, Demonstration, Tactile cues, Verbal cues, and Handouts Education comprehension: verbalized understanding and returned demonstration  HOME EXERCISE PROGRAM: Access Code: [email or web address] URL: [email or web address] Date: 01/11/2023 Prepared by: Glenetta Hew  Exercises Added  - Standing Balance in Corner  - 1 x daily - 7 x weekly - Standing Balance in Corner with Eyes Closed  - 1 x daily - 7 x weekly - Tandem Stance in Corner  - 1 x daily - 7 x weekly  ASSESSMENT:  CLINICAL IMPRESSION: ***  Matthew [name]. continues to demonstrate potential for improvement and would benefit from continued skilled therapy to address impairments.    OBJECTIVE IMPAIRMENTS: Abnormal gait, decreased ROM, decreased strength, increased fascial restrictions, increased muscle spasms, impaired flexibility, and pain.   ACTIVITY LIMITATIONS: squatting, stairs, bed mobility, dressing, and locomotion level  PARTICIPATION LIMITATIONS: driving  PERSONAL FACTORS: 3+ comorbidities: Metatastic prostate CA to bone, HTN, Afib, he has had radiation on R hip 2 years ago, bil knee arthritis.  are also affecting patient's functional outcome.   REHAB POTENTIAL: Excellent  CLINICAL DECISION MAKING: Stable/uncomplicated  EVALUATION COMPLEXITY: Low   GOALS: Goals reviewed with patient? Yes  SHORT TERM GOALS: Target date: 01/16/2023  Patient will be independent with initial HEP. Baseline:  Goal status: MET   2.  Balance assessment completed including review of ABC Scale, TUG and BERG Baseline:  Goal status: MET   LONG TERM GOALS: Target date: 02/13/2023  Patient will be independent with advanced/ongoing HEP to improve outcomes and carryover.  Baseline:  Goal status: IN PROGRESS  2.  Patient will report at least 75% improvement in R hip pain to improve QOL. Baseline:  Goal status: MET 01/25/23- no pain in R hip.  3.  Patient will demonstrate improved functional LE strength as demonstrated by ability to perform 5xSTS without UE support in <= 12 sec. Baseline: 14.98 sec uses bil hands lightly Goal status: IN PROGRESS  4.  Patient will be able to ambulate 600' and negotiate curbs with LRAD and normal gait pattern without increased pain to access community.  Baseline:  Goal status: IN PROGRESS  5.  Patient will report >= 41/80 on LEFS to demonstrate improved functional ability. Baseline: LEFS  32 / 80 = 40.0 % Goal status: IN PROGRESS  6.  Patient will demonstrate 22/24 on DGI to decrease risk of falls. Baseline: 01/08/2023 -19/24 Goal status: IN PROGRESS     PLAN:  PT FREQUENCY: 2x/week  PT DURATION: 6 weeks  PLANNED INTERVENTIONS: Therapeutic exercises, Therapeutic activity, Neuromuscular re-education, Balance training, Gait training, Patient/Family education, Self Care, Joint mobilization, Stair training, Dry Needling, Electrical stimulation, Spinal mobilization, Cryotherapy, Moist heat, Taping, Ionotophoresis '4mg'$ /ml Dexamethasone, Manual therapy, and Re-evaluation  PLAN FOR NEXT SESSION: . Work on balance, stepping over obstacles, narrow BOS, gait with head turns   Rennie Natter, PT  01/25/2023, 8:56 AM

## 2023-01-29 ENCOUNTER — Ambulatory Visit
Admission: RE | Admit: 2023-01-29 | Discharge: 2023-01-29 | Disposition: A | Discharge status: Home / Self Care | Payer: Medicare PPO | Source: Ambulatory Visit | Attending: Radiation Oncology | Admitting: Radiation Oncology

## 2023-01-29 ENCOUNTER — Ambulatory Visit: Payer: Medicare PPO | Admitting: Physical Therapy

## 2023-01-29 ENCOUNTER — Other Ambulatory Visit: Payer: Self-pay

## 2023-01-29 DIAGNOSIS — C7951 Secondary malignant neoplasm of bone: Secondary | ICD-10-CM | POA: Diagnosis not present

## 2023-01-29 DIAGNOSIS — C61 Malignant neoplasm of prostate: Secondary | ICD-10-CM | POA: Diagnosis not present

## 2023-01-29 DIAGNOSIS — Z510 Encounter for antineoplastic radiation therapy: Secondary | ICD-10-CM | POA: Diagnosis not present

## 2023-01-29 LAB — RAD ONC ARIA SESSION SUMMARY
Course Elapsed Days: 0
Plan Fractions Treated to Date: 1
Plan Prescribed Dose Per Fraction: 8 Gy
Plan Total Fractions Prescribed: 5
Plan Total Prescribed Dose: 40 Gy
Reference Point Dosage Given to Date: 8 Gy
Reference Point Session Dosage Given: 8 Gy
Session Number: 1

## 2023-01-30 ENCOUNTER — Ambulatory Visit: Payer: Medicare PPO | Admitting: Radiation Oncology

## 2023-01-31 ENCOUNTER — Other Ambulatory Visit: Payer: Self-pay

## 2023-01-31 ENCOUNTER — Ambulatory Visit
Admission: RE | Admit: 2023-01-31 | Discharge: 2023-01-31 | Disposition: A | Discharge status: Home / Self Care | Payer: Medicare PPO | Source: Ambulatory Visit | Attending: Radiation Oncology | Admitting: Radiation Oncology

## 2023-01-31 DIAGNOSIS — C7951 Secondary malignant neoplasm of bone: Secondary | ICD-10-CM | POA: Diagnosis not present

## 2023-01-31 DIAGNOSIS — C61 Malignant neoplasm of prostate: Secondary | ICD-10-CM | POA: Diagnosis not present

## 2023-01-31 DIAGNOSIS — Z510 Encounter for antineoplastic radiation therapy: Secondary | ICD-10-CM | POA: Diagnosis not present

## 2023-01-31 LAB — RAD ONC ARIA SESSION SUMMARY
Course Elapsed Days: 2
Plan Fractions Treated to Date: 2
Plan Prescribed Dose Per Fraction: 8 Gy
Plan Total Fractions Prescribed: 5
Plan Total Prescribed Dose: 40 Gy
Reference Point Dosage Given to Date: 16 Gy
Reference Point Session Dosage Given: 8 Gy
Session Number: 2

## 2023-02-01 ENCOUNTER — Ambulatory Visit: Payer: Medicare PPO | Admitting: Radiation Oncology

## 2023-02-01 DIAGNOSIS — D3131 Benign neoplasm of right choroid: Secondary | ICD-10-CM | POA: Diagnosis not present

## 2023-02-01 DIAGNOSIS — H43813 Vitreous degeneration, bilateral: Secondary | ICD-10-CM | POA: Diagnosis not present

## 2023-02-01 DIAGNOSIS — H26492 Other secondary cataract, left eye: Secondary | ICD-10-CM | POA: Diagnosis not present

## 2023-02-04 ENCOUNTER — Other Ambulatory Visit: Payer: Self-pay

## 2023-02-04 ENCOUNTER — Ambulatory Visit
Admission: RE | Admit: 2023-02-04 | Discharge: 2023-02-04 | Disposition: A | Discharge status: Home / Self Care | Payer: Medicare PPO | Source: Ambulatory Visit | Attending: Radiation Oncology | Admitting: Radiation Oncology

## 2023-02-04 DIAGNOSIS — C7951 Secondary malignant neoplasm of bone: Secondary | ICD-10-CM | POA: Diagnosis not present

## 2023-02-04 DIAGNOSIS — C61 Malignant neoplasm of prostate: Secondary | ICD-10-CM

## 2023-02-04 LAB — RAD ONC ARIA SESSION SUMMARY
Course Elapsed Days: 6
Plan Fractions Treated to Date: 3
Plan Prescribed Dose Per Fraction: 8 Gy
Plan Total Fractions Prescribed: 5
Plan Total Prescribed Dose: 40 Gy
Reference Point Dosage Given to Date: 24 Gy
Reference Point Session Dosage Given: 8 Gy
Session Number: 3

## 2023-02-06 ENCOUNTER — Other Ambulatory Visit: Payer: Self-pay

## 2023-02-06 ENCOUNTER — Ambulatory Visit
Admission: RE | Admit: 2023-02-06 | Discharge: 2023-02-06 | Disposition: A | Discharge status: Home / Self Care | Payer: Medicare PPO | Source: Ambulatory Visit | Attending: Radiation Oncology | Admitting: Radiation Oncology

## 2023-02-06 DIAGNOSIS — C61 Malignant neoplasm of prostate: Secondary | ICD-10-CM

## 2023-02-06 DIAGNOSIS — C7951 Secondary malignant neoplasm of bone: Secondary | ICD-10-CM | POA: Diagnosis not present

## 2023-02-06 LAB — RAD ONC ARIA SESSION SUMMARY
Course Elapsed Days: 8
Plan Fractions Treated to Date: 4
Plan Prescribed Dose Per Fraction: 8 Gy
Plan Total Fractions Prescribed: 5
Plan Total Prescribed Dose: 40 Gy
Reference Point Dosage Given to Date: 32 Gy
Reference Point Session Dosage Given: 8 Gy
Session Number: 4

## 2023-02-08 ENCOUNTER — Ambulatory Visit
Admission: RE | Admit: 2023-02-08 | Discharge: 2023-02-08 | Disposition: A | Discharge status: Home / Self Care | Payer: Medicare PPO | Source: Ambulatory Visit | Attending: Radiation Oncology | Admitting: Radiation Oncology

## 2023-02-08 ENCOUNTER — Encounter: Payer: Self-pay | Admitting: Urology

## 2023-02-08 ENCOUNTER — Other Ambulatory Visit: Payer: Self-pay

## 2023-02-08 DIAGNOSIS — C7951 Secondary malignant neoplasm of bone: Secondary | ICD-10-CM | POA: Diagnosis not present

## 2023-02-08 DIAGNOSIS — Z510 Encounter for antineoplastic radiation therapy: Secondary | ICD-10-CM | POA: Diagnosis not present

## 2023-02-08 DIAGNOSIS — C61 Malignant neoplasm of prostate: Secondary | ICD-10-CM | POA: Diagnosis not present

## 2023-02-08 LAB — RAD ONC ARIA SESSION SUMMARY
Course Elapsed Days: 10
Plan Fractions Treated to Date: 5
Plan Prescribed Dose Per Fraction: 8 Gy
Plan Total Fractions Prescribed: 5
Plan Total Prescribed Dose: 40 Gy
Reference Point Dosage Given to Date: 40 Gy
Reference Point Session Dosage Given: 8 Gy
Session Number: 5

## 2023-02-26 ENCOUNTER — Encounter: Payer: Self-pay | Admitting: Cardiology

## 2023-02-26 ENCOUNTER — Ambulatory Visit: Payer: Medicare PPO | Attending: Cardiology | Admitting: Cardiology

## 2023-02-26 VITALS — BP 140/82 | HR 94 | Ht 72.0 in | Wt 234.0 lb

## 2023-02-26 DIAGNOSIS — I471 Supraventricular tachycardia, unspecified: Secondary | ICD-10-CM | POA: Diagnosis not present

## 2023-02-26 DIAGNOSIS — I483 Typical atrial flutter: Secondary | ICD-10-CM | POA: Diagnosis not present

## 2023-02-26 NOTE — Progress Notes (Signed)
Electrophysiology Office Note   Date:  02/26/2023   ID:  Matthew Rash., DOB 1941-02-02, MRN [id number]  PCP:  Wenda Low, MD  Cardiologist:  Virgina Jock Primary Electrophysiologist:  Pervis Macintyre Meredith Leeds, MD    Chief Complaint: SVT   History of Present Illness: Matthew Ouk. is a 82 y.o. male who is being seen today for the evaluation of SVT at the request of Patwardhan, Reynold Bowen, MD. Presenting today for electrophysiology evaluation.  He has a history significant for prostate cancer status post prostatectomy, Barrett's esophagus, atrial flutter during SIRS, spontaneous retroperitoneal hematoma.  He is undergoing radiation therapy for solitary mets to the rib.  He has had multiple arrhythmias over the years.  Again he had atrial flutter during admission for SIRS in 2022.  He had asymptomatic sinus pauses while sleeping.  He has had multiple episodes of SVT as well as sinus pauses on telemetry.  Today, he denies symptoms of palpitations, chest pain, shortness of breath, orthopnea, PND, lower extremity edema, claudication, dizziness, presyncope, syncope, bleeding, or neurologic sequela. The patient is tolerating medications without difficulties.  Since being told that he was having episodes of SVT, he is felt palpitations.  He has no chest pain or shortness of breath.  He is able to do his daily activities without restriction.  He does feel that his anger issues somewhat exacerbate his arrhythmias.   Past Medical History:  Diagnosis Date   Arthritis    Barrett's esophagus    Cancer ([place]) 2004   prostate    Dysrhythmia    GERD (gastroesophageal reflux disease)    pt denies   History of kidney stones    Hyperlipidemia    Hypertension    Pneumonia    In high school   Prostate cancer [facility])    Past Surgical History:  Procedure Laterality Date   CARDIOVERSION N/A 08/08/2021   Procedure: CARDIOVERSION;  Surgeon: Nigel Mormon, MD;  Location: [facility];  Service:  Cardiovascular;  Laterality: N/A;   COLONOSCOPY WITH PROPOFOL N/A 01/09/2016   Procedure: COLONOSCOPY WITH PROPOFOL;  Surgeon: Garlan Fair, MD;  Location: [facility];  Service: Endoscopy;  Laterality: N/A;   CYSTOSCOPY     x 1   CYSTOSCOPY WITH RETROGRADE PYELOGRAM, URETEROSCOPY AND STENT PLACEMENT Left 03/30/2022   Procedure: CYSTOSCOPY WITH LEFT RETROGRADE PYELOGRAM, URETEROSCOPY AND STENT PLACEMENT;  Surgeon: Irine Seal, MD;  Location: [facility];  Service: Urology;  Laterality: Left;  1 HR FOR CASE   ESOPHAGOGASTRODUODENOSCOPY (EGD) WITH PROPOFOL N/A 01/09/2016   Procedure: ESOPHAGOGASTRODUODENOSCOPY (EGD) WITH PROPOFOL;  Surgeon: Garlan Fair, MD;  Location: [facility];  Service: Endoscopy;  Laterality: N/A;   EXTRACORPOREAL SHOCK WAVE LITHOTRIPSY Left 01/25/2022   Procedure: LEFT EXTRACORPOREAL SHOCK WAVE LITHOTRIPSY (ESWL);  Surgeon: Vira Agar, MD;  Location: [facility];  Service: Urology;  Laterality: Left;   EYE SURGERY     bil cataract   KNEE ARTHROSCOPY Left 25 yrs ago   PARATHYROIDECTOMY N/A 02/25/2020   Procedure: NECK EXPLORATION WITH PARATHYROIDECTOMY;  Surgeon: Armandina Gemma, MD;  Location: [facility];  Service: General;  Laterality: N/A;   PROSTATECTOMY  2004     Current Outpatient Medications  Medication Sig Dispense Refill   allopurinol (ZYLOPRIM) 300 MG tablet Take 300 mg by mouth daily.      atorvastatin (LIPITOR) 20 MG tablet Take 20 mg by mouth daily.     esomeprazole (NEXIUM) 20 MG capsule Take 20 mg by mouth daily.  Multiple Vitamin (MULTIVITAMIN WITH MINERALS) TABS tablet Take 1 tablet by mouth daily. Men +     potassium chloride SA (KLOR-CON M) 20 MEQ tablet TAKE 1 TABLET EVERY DAY 90 tablet 3   Torsemide 40 MG TABS Take 40 mg by mouth 2 (two) times daily. (Patient taking differently: Take 40-80 mg by mouth See admin instructions. Taking 80 mg in the morning and 40 mg in the evening) 180 tablet 3   No current facility-administered  medications for this visit.    Allergies:   Patient has no known allergies.   Social History:  The patient  reports that he has never smoked. He has never used smokeless tobacco. He reports current alcohol use of about 4.0 standard drinks of alcohol per week. He reports that he does not use drugs.   Family History:  The patient's family history includes Heart disease in his mother; Heart failure in his mother.    ROS:  Please see the history of present illness.   Otherwise, review of systems is positive for none.   All other systems are reviewed and negative.    PHYSICAL EXAM: VS:  BP (!) 140/82   Pulse 94   Ht 6' (1.829 m)   Wt 234 lb (106.1 kg)   SpO2 96%   BMI 31.74 kg/m  , BMI Body mass index is 31.74 kg/m. GEN: Well nourished, well developed, in no acute distress  HEENT: normal  Neck: no JVD, carotid bruits, or masses Cardiac: RRR; no murmurs, rubs, or gallops,no edema  Respiratory:  clear to auscultation bilaterally, normal work of breathing GI: soft, nontender, nondistended, + BS MS: no deformity or atrophy  Skin: warm and dry Neuro:  Strength and sensation are intact Psych: euthymic mood, full affect  EKG:  EKG is not ordered today. Personal review of the ekg ordered 12/28/22 shows sinus rhythm, first-degree AV block, intermittent junctional escape  Recent Labs: 12/26/2022: ALT 15; Magnesium 2.2 12/27/2022: BUN 28; Creatinine, Ser 1.77; Hemoglobin 11.6; Platelets 248; Potassium 3.6; Sodium 139    Lipid Panel  No results found for: "CHOL", "TRIG", "HDL", "CHOLHDL", "VLDL", "LDLCALC", "LDLDIRECT"   Wt Readings from Last 3 Encounters:  02/26/23 234 lb (106.1 kg)  01/24/23 231 lb (104.8 kg)  01/17/23 232 lb 6.4 oz (105.4 kg)      Other studies Reviewed: Additional studies/ records that were reviewed today include: TTE 12/26/22  Review of the above records today demonstrates:   1. Left ventricular ejection fraction, by estimation, is 55 to 60%. The  left  ventricle has normal function. The left ventricle has no regional  wall motion abnormalities. Left ventricular diastolic parameters were  normal.   2. Right ventricular systolic function is normal. The right ventricular  size is normal.   3. Left atrial size was mildly dilated.   4. The mitral valve is normal in structure. No evidence of mitral valve  regurgitation. No evidence of mitral stenosis.   5. The aortic valve is tricuspid. There is mild calcification of the  aortic valve. There is mild thickening of the aortic valve. Aortic valve  regurgitation is not visualized. Aortic valve sclerosis is present, with  no evidence of aortic valve stenosis.   6. Aortic dilatation noted. There is mild dilatation of the aortic root,  measuring 39 mm. There is moderate dilatation of the ascending aorta,  measuring 43 mm.   7. The inferior vena cava is normal in size with greater than 50%  respiratory variability, suggesting right atrial  pressure of 3 mmHg.   Cardiac monitor 01/20/2023 personally reviewed Dominant rhythm: Sinus. HR 49-110 bpm. Avg HR 77 bpm, in sinus rhythm. >11,000 episodes of SVT/atrial tachycardia, fastest at 150 bpm for 32.5 secs, longest for 36.6 secs at 107 bpm. 3.3% isolated SVE, 2.9% couplets, 2.2% triplets. 1 episodes of VT, at 138 bpm for 4 beats. 1.8% isolated VE, <1% couplets. No definite evidence of Afib/Aflutter noted. 6 episodes of sinus pauses,longest at 3.3 secs, generally between midnight and 8 AM. No high grade AV block noted. 0 patient triggered events.   ASSESSMENT AND PLAN:  1.  Atrial flutter: Occurred while he was sick in the hospital.  No obvious recurrence.  Retroperitoneal hematoma.  No indication for anticoagulation currently.  2.  SVT: He has had multiple episodes of SVT.  Wore cardiac monitor with episodes that were up to 150 bpm although none left greater than a minute.  He does state that he has anger issues and thinks that this could be  contributing.  As he did have some pauses and is minimally symptomatic, would monitor for further arrhythmias.  Jenetta Wease hold off on medications for now.  Discussed with primary cardiology  Current medicines are reviewed at length with the patient today.   The patient does not have concerns regarding his medicines.  The following changes were made today:  none  Labs/ tests ordered today include:  No orders of the defined types were placed in this encounter.    Disposition:   FU with Samanthajo Payano  PRN  Signed, Patriciaann Rabanal Meredith Leeds, MD  02/26/2023 9:38 AM     [place] [address] [phone] (office) [phone] (fax)

## 2023-02-26 NOTE — Patient Instructions (Signed)
Medication Instructions:  Your physician recommends that you continue on your current medications as directed. Please refer to the Current Medication list given to you today. *If you need a refill on your cardiac medications before your next appointment, please call your pharmacy*    Follow-Up: At [facility], you and your health needs are our priority.  As part of our continuing mission to provide you with exceptional heart care, we have created designated Provider Care Teams.  These Care Teams include your primary Cardiologist (physician) and Advanced Practice Providers (APPs -  Physician Assistants and Nurse Practitioners) who all work together to provide you with the care you need, when you need it.  We recommend signing up for the patient portal called "MyChart".  Sign up information is provided on this After Visit Summary.  MyChart is used to connect with patients for Virtual Visits (Telemedicine).  Patients are able to view lab/test results, encounter notes, upcoming appointments, etc.  Non-urgent messages can be sent to your provider as well.   To learn more about what you can do with MyChart, go to [email or web address].    Your next appointment:   As Needed  Provider:   Allegra Lai, MD

## 2023-03-12 ENCOUNTER — Ambulatory Visit
Admission: RE | Admit: 2023-03-12 | Discharge: 2023-03-12 | Disposition: A | Discharge status: Home / Self Care | Payer: Medicare PPO | Source: Ambulatory Visit | Attending: Radiation Oncology | Admitting: Radiation Oncology

## 2023-03-12 DIAGNOSIS — C61 Malignant neoplasm of prostate: Secondary | ICD-10-CM | POA: Insufficient documentation

## 2023-03-12 DIAGNOSIS — C7951 Secondary malignant neoplasm of bone: Secondary | ICD-10-CM | POA: Insufficient documentation

## 2023-03-12 NOTE — Progress Notes (Signed)
                                                                                                                                                             Patient Name: Matthew [name] MRN: [id number] DOB: 10-Mar-1941 Referring Physician: Georgann Housekeeper (Profile Not Attached) Date of Service: 02/08/2023 [facility], [place]                                                        End Of Treatment Note  Diagnoses: C79.51-Secondary malignant neoplasm of bone  Cancer Staging: 82 y.o. gentleman with oligometastatic prostate cancer involving a solitary, left posterior 12th rib.   Intent: Curative  Radiation Treatment Dates: 01/29/2023 through 02/08/2023 Site Technique Total Dose (Gy) Dose per Fx (Gy) Completed Fx Beam Energies  Ribs, Left: Chest_L_12thRib IMRT 40/40 8 5/5 [id number]   Narrative: The patient tolerated radiation therapy relatively well with only mild fatigue.  Plan: The patient will receive a call in about one month from the radiation oncology department. He will continue follow up with his urologist, Dr. Annabell Howells as well.  ------------------------------------------------   Margaretmary Dys, MD [facility]  Radiation Oncology Direct Dial: [phone]  Fax: [phone] [email or web address]  Skype  LinkedIn

## 2023-03-12 NOTE — Progress Notes (Signed)
  Radiation Oncology         [phone]) [phone] ________________________________  Name: Matthew [name]. MRN: [id number]  Date of Service: 03/12/2023  DOB: May 15, 1941  Post Treatment Telephone Note  Diagnosis:  Malignant neoplasm of prostate metastatic to bone (as documented in provider EOT note)   The patient was available for call today.  The patient did note fatigue during radiation but has completely resolved itself. The patient did not note skin changes in the field of radiation during therapy. The patient has noticed improvement in pain in the area(s) treated with radiation. The patient is not taking dexamethasone. The patient does not have symptoms of  weakness or loss of control of the extremities. The patient does not have symptoms of headache. The patient does not have symptoms of seizure or uncontrolled movement. The patient does not have symptoms of changes in vision. The patient does not have changes in speech. The patient does not have confusion.   The patient is currently scheduled for ongoing care with his cardiologist Dr. Rosemary Holms, Anabel Bene, MD but has NO future appointments in radiation at this time. The patient was encouraged to call if he develops concerns or questions regarding radiation.   This concludes the interview.   Ruel Favors, LPN

## 2023-04-10 IMAGING — US US ABDOMEN COMPLETE
1 series · 15 of 25 positions shown · non-contrast
Comparison: None.

CLINICAL DATA: Sepsis

EXAM:
ABDOMEN ULTRASOUND COMPLETE

[Series 1: us abdomen complete mc & wl · 15 of 111 slices shown]
[im 1/111]
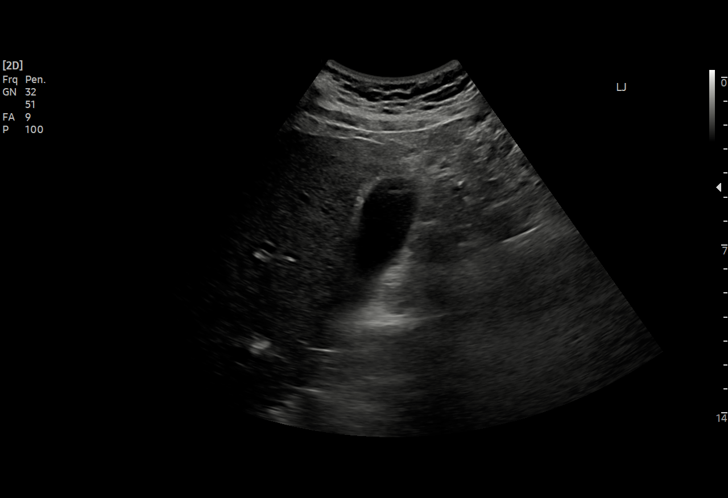
[im 10/111]
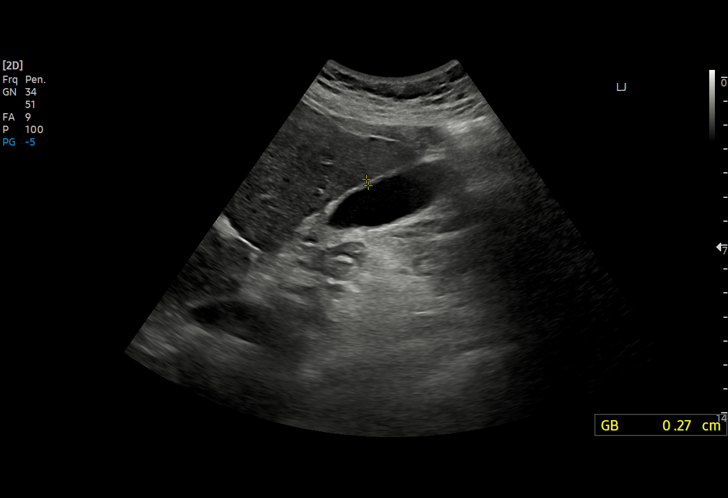
[im 19/111]
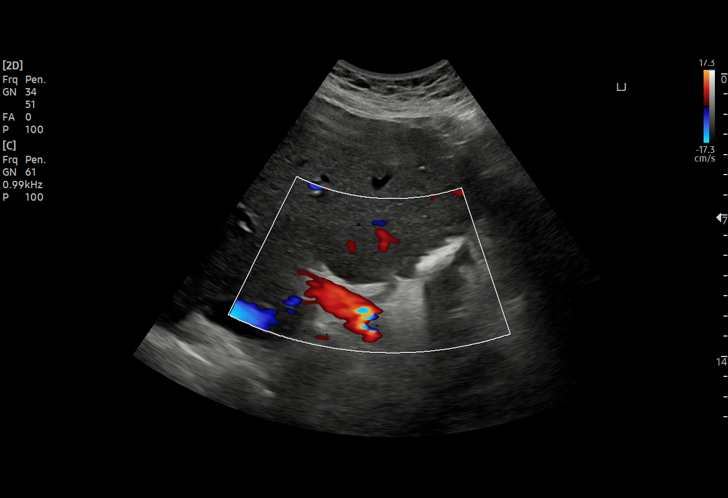
[im 23/111]
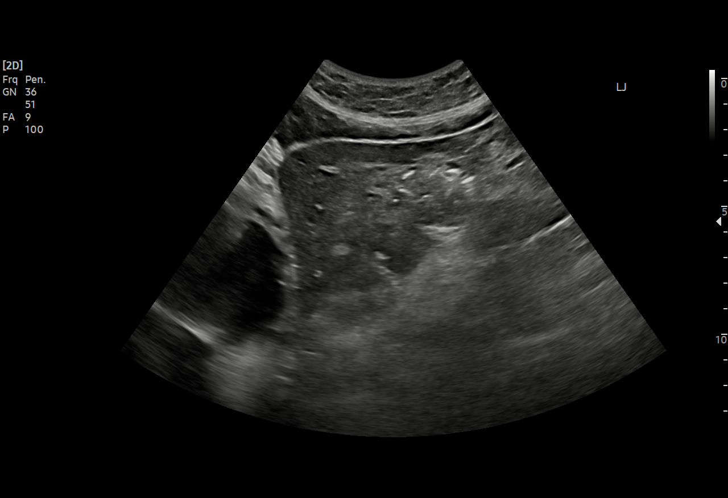
[im 33/111]
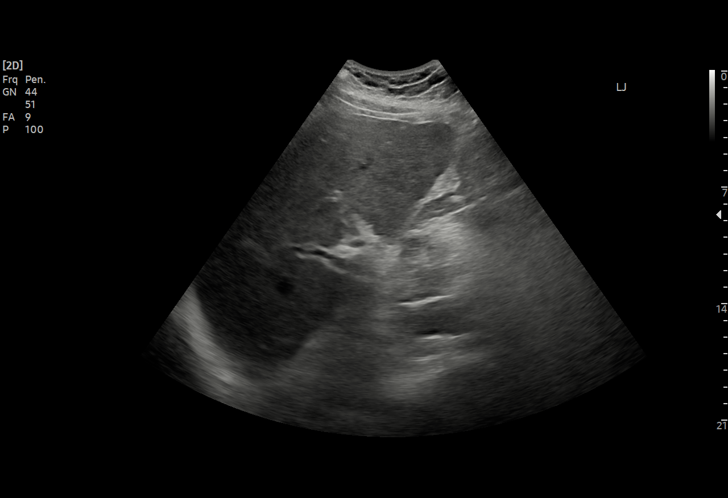
[im 42/111]
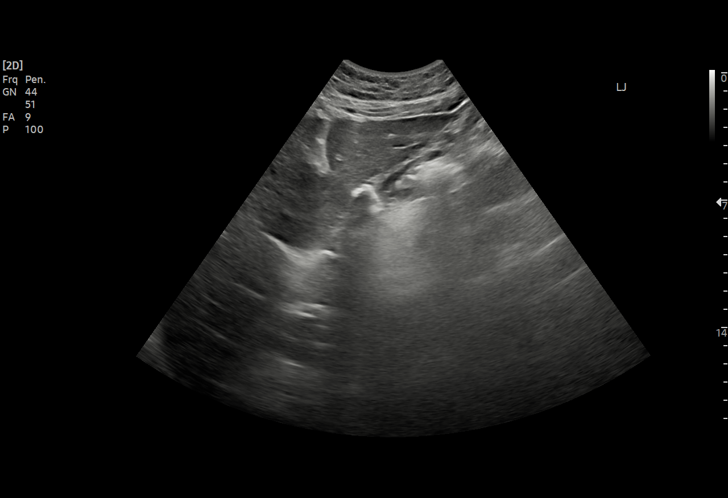
[im 46/111]
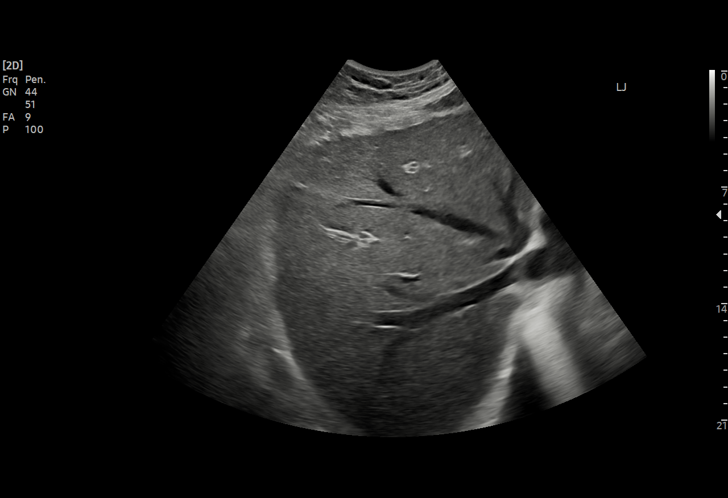
[im 56/111]
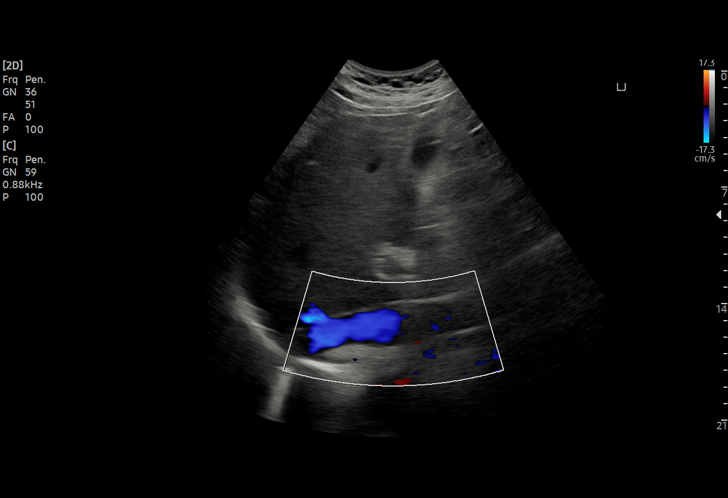
[im 65/111]
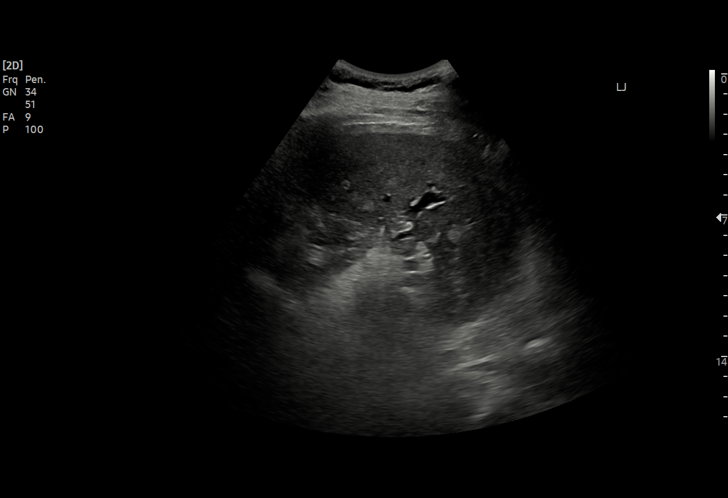
[im 69/111]
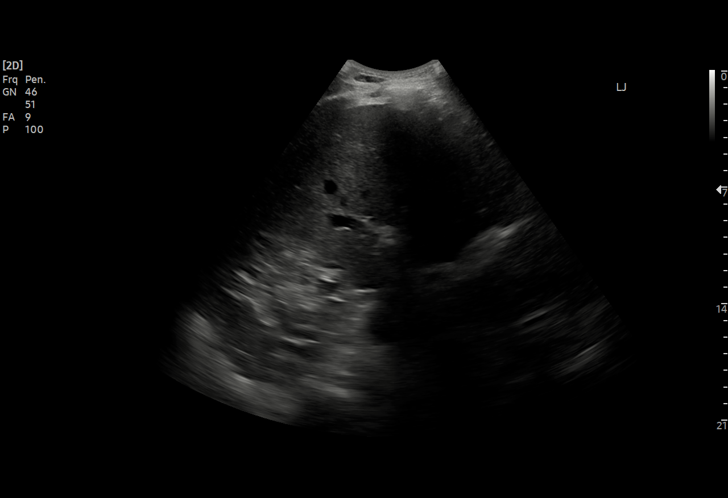
[im 78/111]
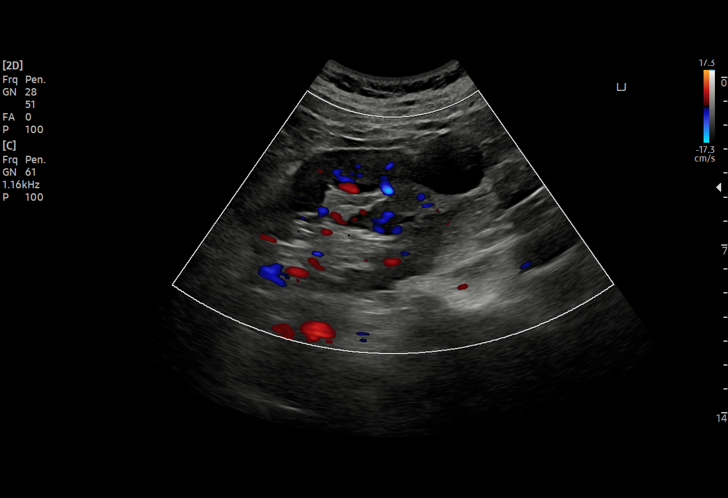
[im 88/111]
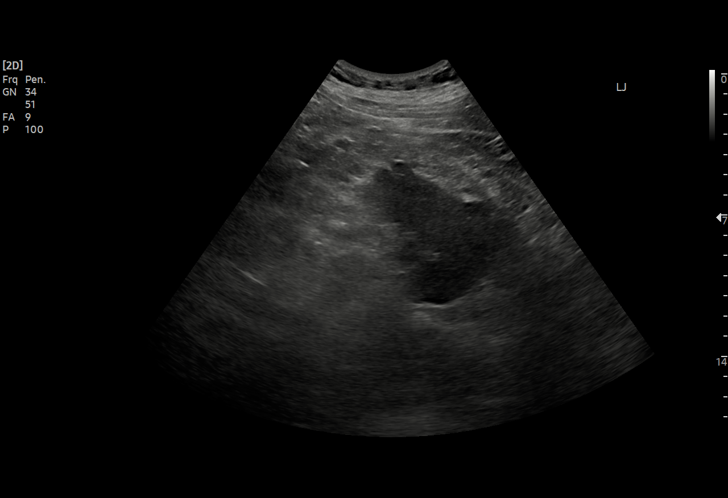
[im 92/111]
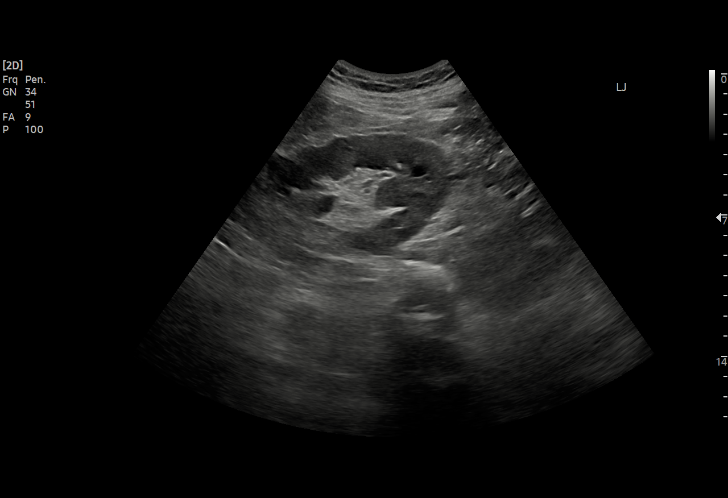
[im 101/111]
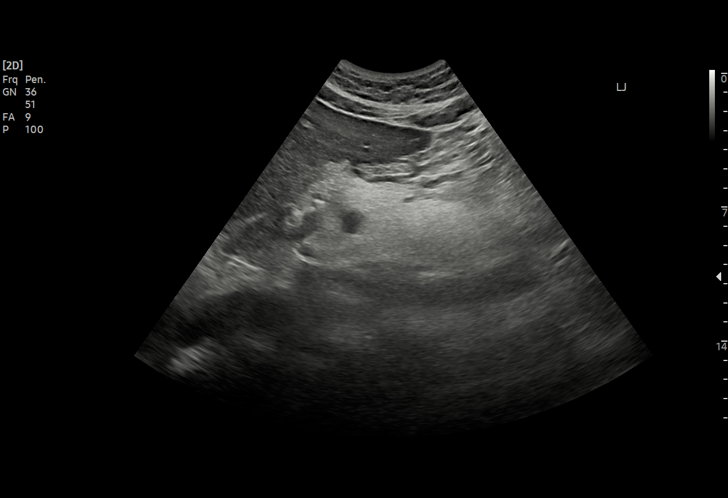
[im 111/111]
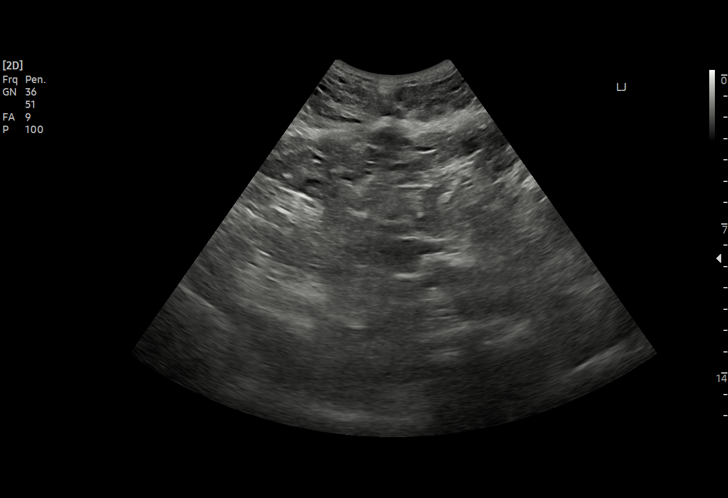

[15 of 25 positions shown; findings below may reference images not displayed]

FINDINGS: Gallbladder: No gallstones or wall thickening visualized. No
sonographic Murphy sign noted by sonographer.

Common bile duct: Diameter: Normal caliber, 6 mm.

Liver: No focal lesion identified. Within normal limits in
parenchymal echogenicity. Portal vein is patent on color Doppler
imaging with normal direction of blood flow towards the liver.

IVC: No abnormality visualized.

Pancreas: Visualized portion unremarkable.

Spleen: Size and appearance within normal limits.

Right Kidney: Length: Not visualized.

Left Kidney: Length: 15.6 cm. Normal echotexture. Small cysts, the
largest measuring 3.2 cm.

Abdominal aorta: No aneurysm.

Other findings: None.
IMPRESSION: No acute findings.

## 2023-04-10 IMAGING — DX DG CHEST 1V PORT
1 series · 1 of 1 positions shown · non-contrast
Comparison: 11/23/2006

CLINICAL DATA: Questionable sepsis

EXAM:
PORTABLE CHEST 1 VIEW

[chest ap]
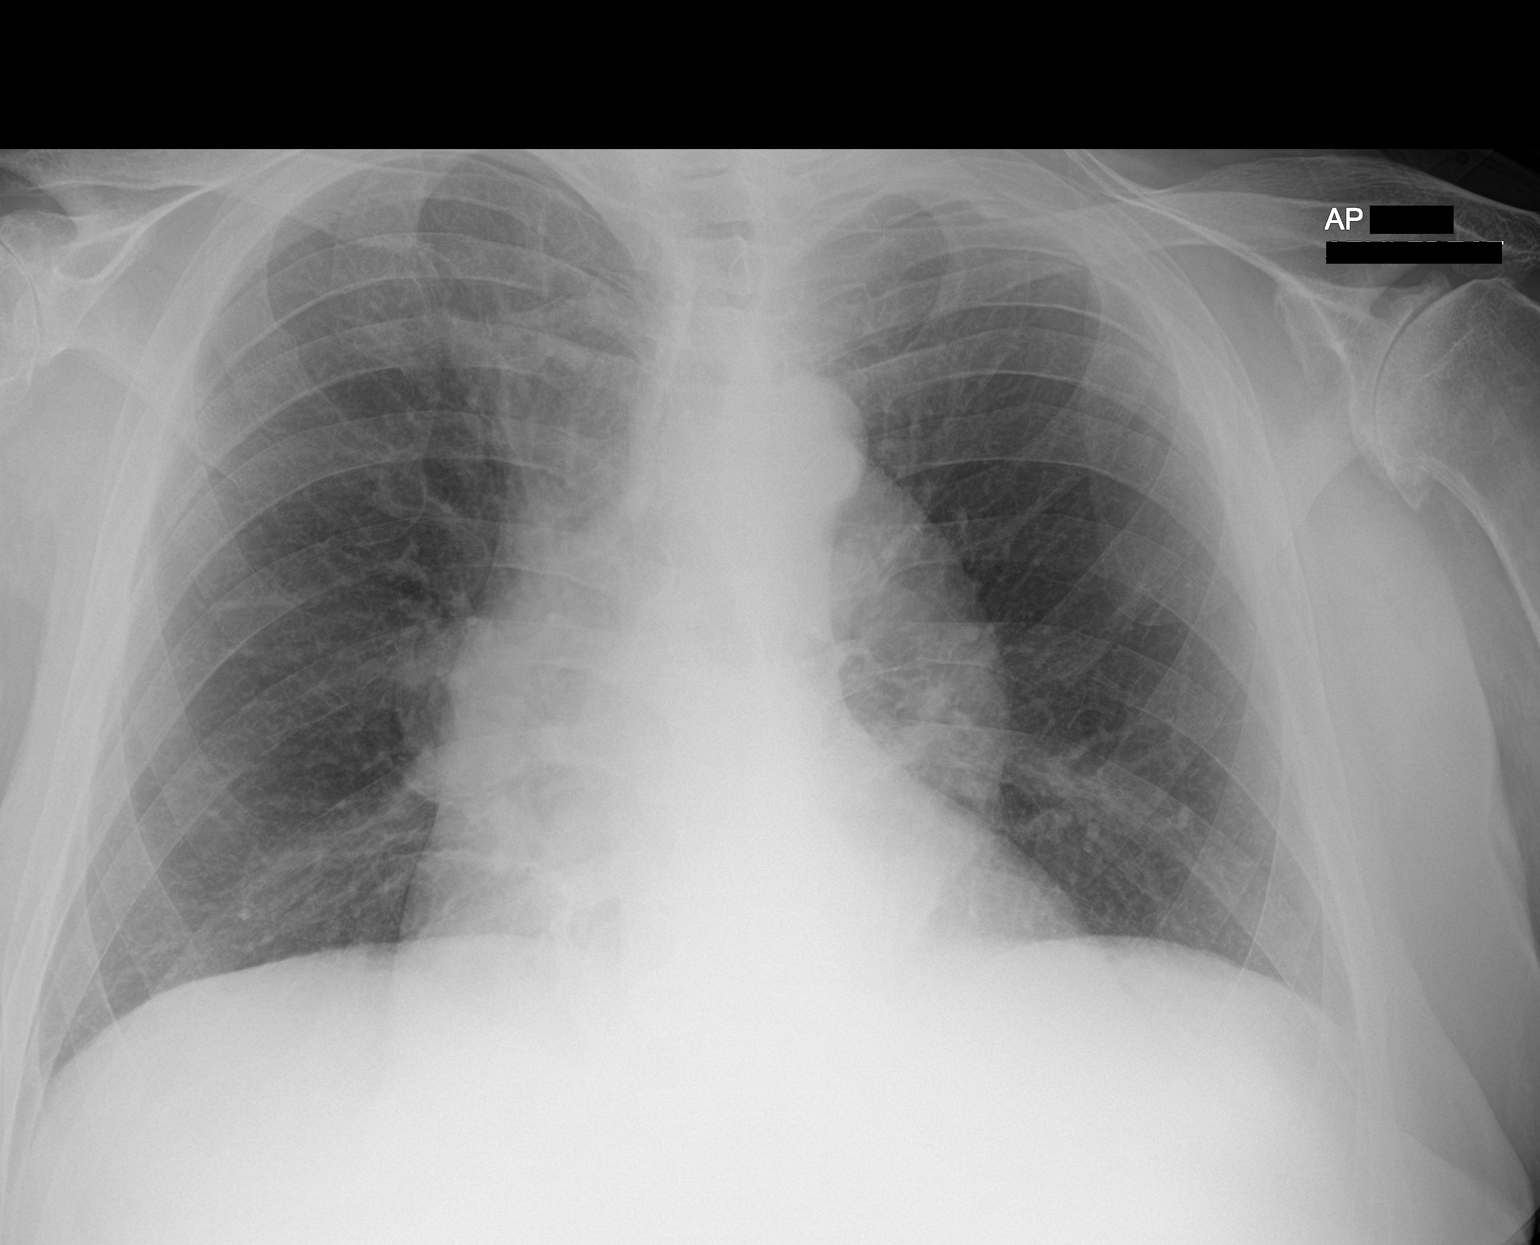

[1 of 1 positions shown; findings below may reference images not displayed]

FINDINGS: Heart is borderline in size. Tortuosity of the thoracic aorta. No
confluent airspace opacities or effusions. No acute bony
abnormality.
IMPRESSION: Borderline heart size.  Tortuous aorta.  No active disease.

## 2023-04-10 IMAGING — CT CT ABD-PELV W/ CM
2 of 5 series · 15 of 46 positions shown, 17 images · IV contrast (OMNIPAQUE 350)
Comparison: Abdominal ultrasound earlier today. PET CT 05/23/2021,
abdominal CT 04/04/2021. Abdominal MRI 04/19/2021

CLINICAL DATA: Abdominal abscess/infection suspected

Patient reports fever, lethargy and body aches.
History of prostate cancer post radiation for bony lesions.
EXAM:
CT ABDOMEN AND PELVIS WITH CONTRAST
TECHNIQUE: Multidetector CT imaging of the abdomen and pelvis was performed
using the standard protocol following bolus administration of
intravenous contrast.
CONTRAST:  80mL OMNIPAQUE IOHEXOL 350 MG/ML SOLN

[Series 2: axial st · axial · 0.89mm/px · z∈[+922,+1372]mm · 12 of 104 slices shown, 14 images]
[im 7/104  soft-tissue]
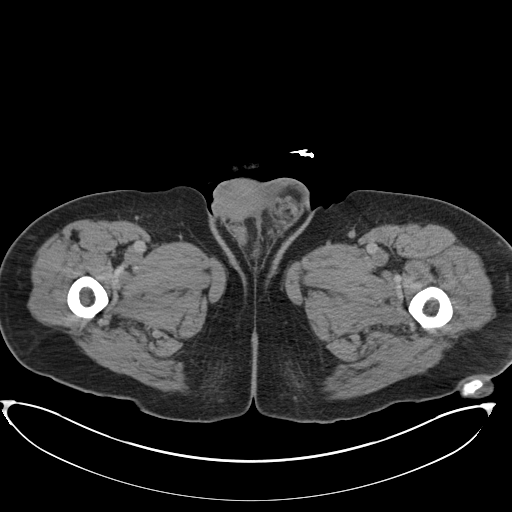
[im 7/104  bone]
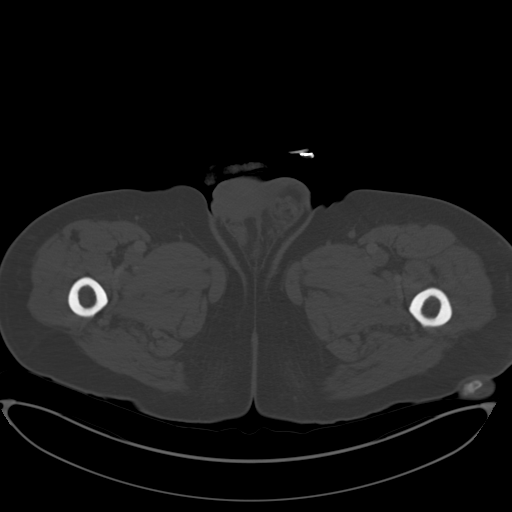
[im 14/104  soft-tissue]
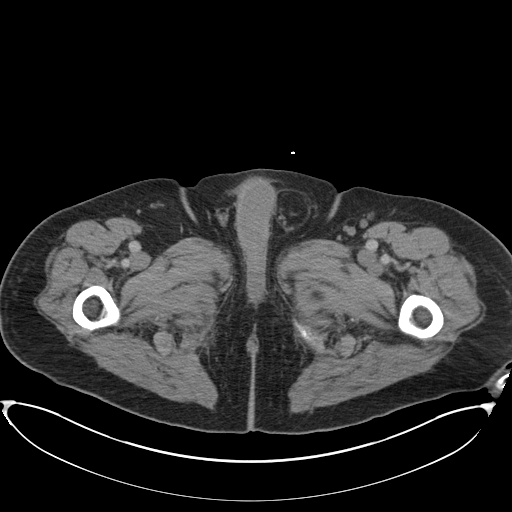
[im 21/104  soft-tissue]
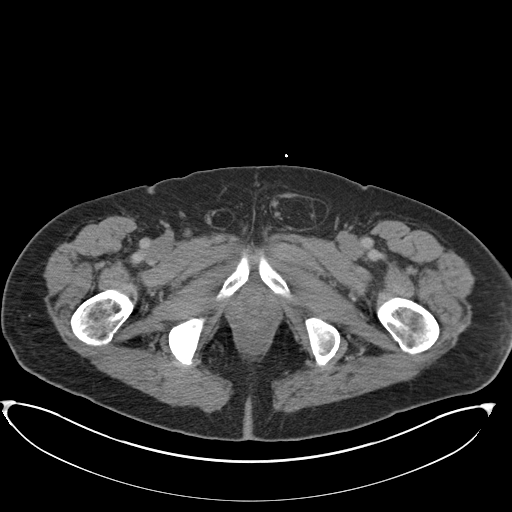
[im 35/104  soft-tissue]
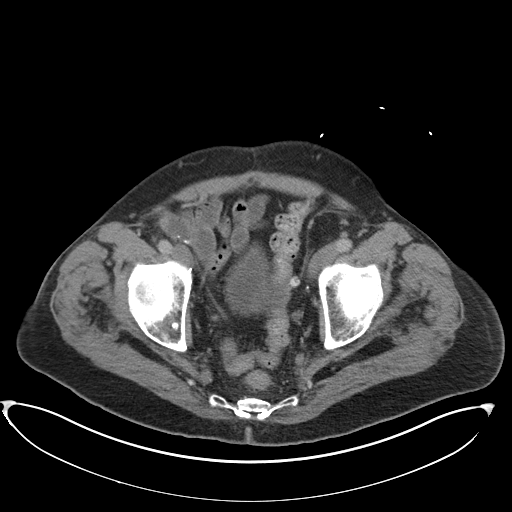
[im 42/104  soft-tissue]
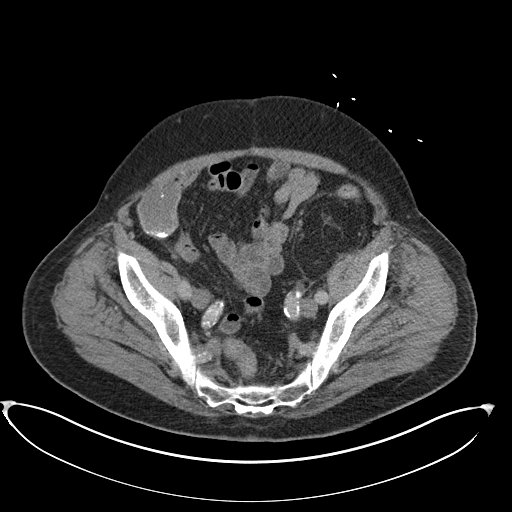
[im 49/104  soft-tissue]
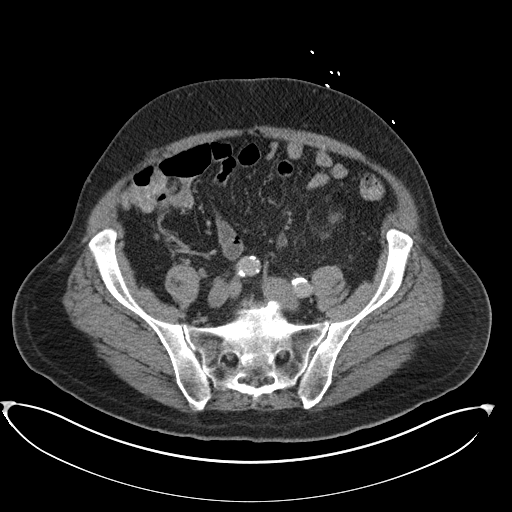
[im 55/104  soft-tissue]
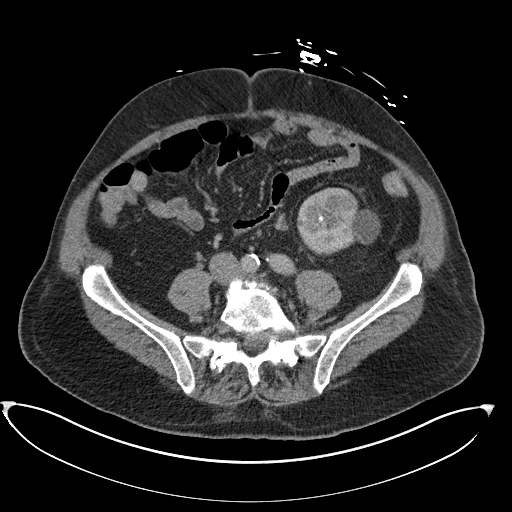
[im 62/104  soft-tissue]
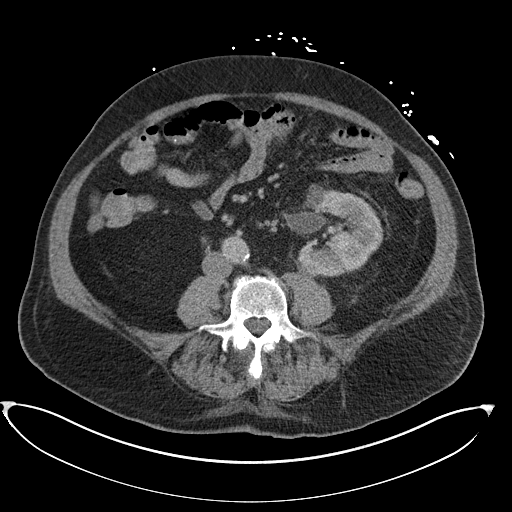
[im 69/104  soft-tissue]
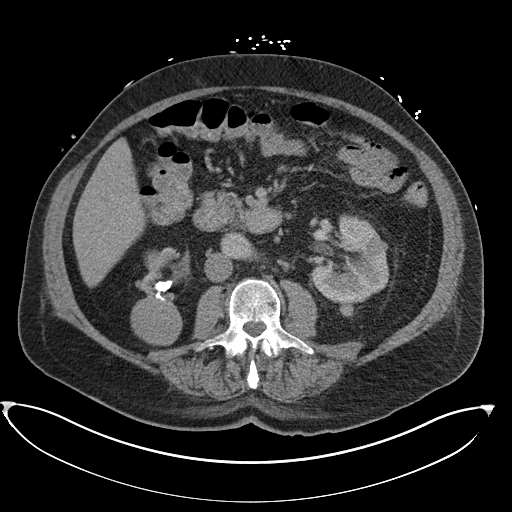
[im 69/104  bone]
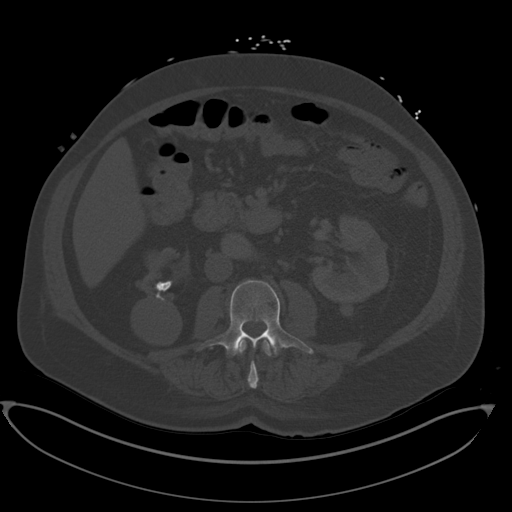
[im 83/104  soft-tissue]
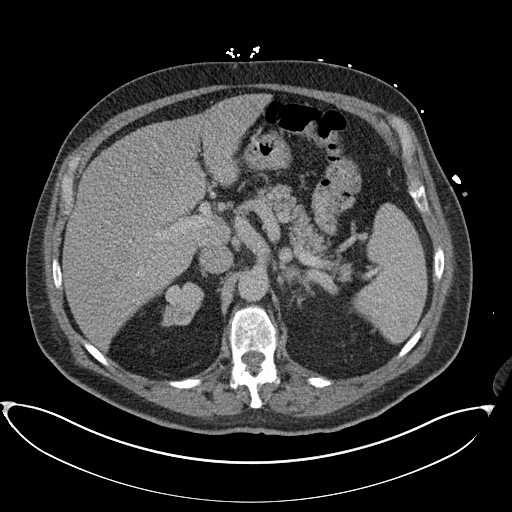
[im 90/104  soft-tissue]
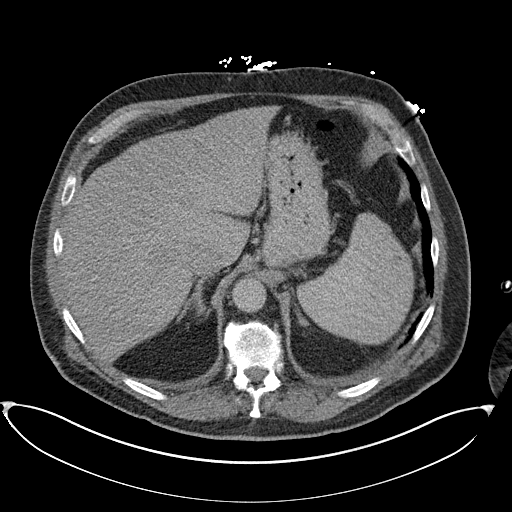
[im 97/104  soft-tissue]
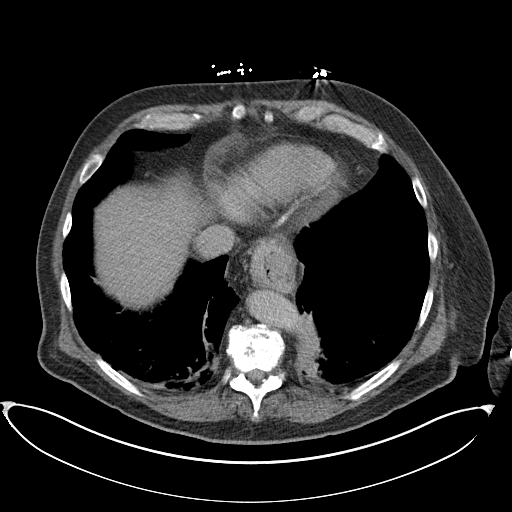

[Series 4: coronal st · coronal · 0.99mm/px · 3 of 160 slices shown]
[im 54/160  soft-tissue]
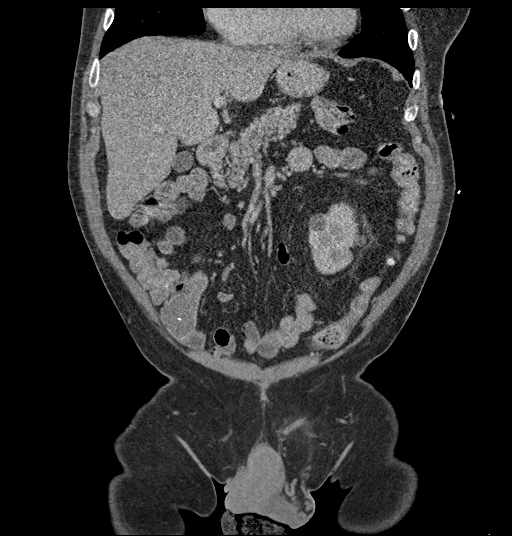
[im 71/160  soft-tissue]
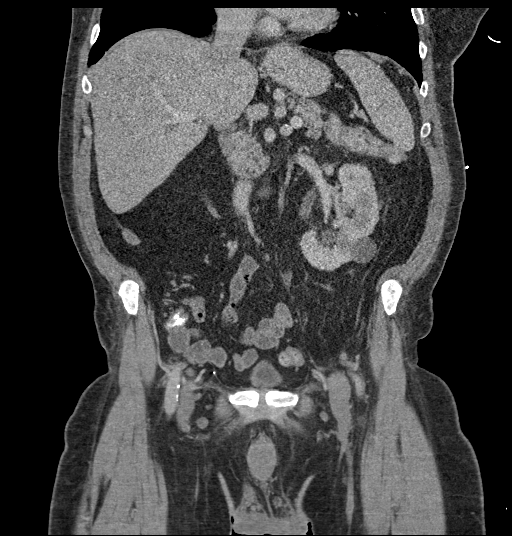
[im 89/160  soft-tissue]
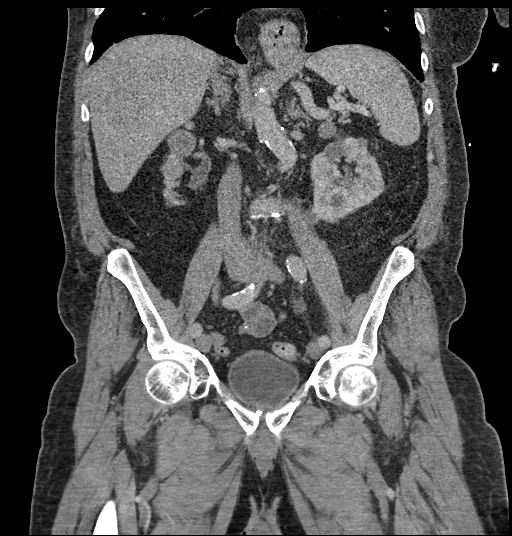

[15 of 46 positions shown; findings below may reference images not displayed]

FINDINGS: Lower chest: Descending thoracic aorta is tortuous. There is
dependent atelectasis in the medial aspect of both lower lobes.
Small to moderate-sized hiatal hernia. There is minimal basilar
septal thickening. Trace pleural effusions.

Hepatobiliary: No focal liver abnormality is seen. No gallstones,
gallbladder wall thickening, or biliary dilatation.

Pancreas: No ductal dilatation or inflammation. No evidence of
pancreatic mass.

Spleen: There is scattered low-density lesions throughout the
spleen, largest centrally measures 11 mm, other lesions are
subcentimeter. Overall normal in size.

Adrenals/Urinary Tract: No adrenal nodule. Chronic right renal
parenchymal atrophy. There are innumerable lesions of the right
kidney likely representing simple and complex renal cysts. Slight
dilatation of the right renal pelvis with ureteral thickening.
Nonobstructing calculi in the lower right kidney measuring up to 14
mm. There is slight compensatory hypertrophy of the left kidney.
Multiple simple and complex cysts throughout the left kidney. Slight
dilatation of the left renal pelvis with mild left ureteral
dilatation. No obstructing stone. There is a nonobstructing 5 mm
stone in the lower left kidney. Partially distended urinary bladder.
No bladder wall thickening

Stomach/Bowel: Bowel assessment is limited in the absence of enteric
contrast. Small to moderate hiatal hernia. Stomach is nondistended
further limiting assessment. There is no small bowel obstruction or
inflammation. Appendix is tentatively but not definitively
identified and normal. Small volume of colonic stool. Colonic
diverticulosis involving the descending and sigmoid colon. No
diverticulitis. No colonic inflammation.

Vascular/Lymphatic: Aortic atherosclerosis and tortuosity. No aortic
aneurysm. Patent portal vein. No enlarged lymph nodes in the abdomen
or pelvis.

Reproductive: Prostatectomy.

Other: Fat in the left greater than right inguinal canals. No
ascites or free air. No abscess or abdominopelvic collection.

Musculoskeletal: Stable appearance of sclerotic lesion in the right
ischium. Tiny sclerotic lesion in the right acetabulum is unchanged.
No acute fracture or new osseous lesion. Diffuse degenerative change
in the spine
IMPRESSION: 1. No acute abnormality or explanation for abdominal pain.
2. Colonic diverticulosis without diverticulitis.
3. Chronic right renal parenchymal atrophy. Bilateral nonobstructing
renal calculi. Bilateral simple and complex renal cysts, recently
assessed on MRI.
4. Multiple low-density lesions throughout the spleen, largest
centrally measures 11 mm. These are typically benign, and not seen
on prior noncontrast exams.
5. Stable sclerotic lesions in the right ischium and right
acetabulum.
6. Small to moderate-sized hiatal hernia.
7. Minimal basilar septal thickening at the lung bases, may
represent mild pulmonary edema. Trace pleural effusions.

Aortic Atherosclerosis ([id number]).

## 2023-04-24 ENCOUNTER — Ambulatory Visit: Payer: Medicare PPO | Admitting: Cardiology

## 2023-04-24 NOTE — Progress Notes (Deleted)
Follow up visit  Subjective:   Matthew Georges., male    DOB: Jul 06, 1941, 82 y.o.   MRN: [id number]    HPI  No chief complaint on file.   82 y.o.  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  Patient is currently undergoing radiation therapy with Dr. Kathrynn Running for solitary mets to the rib.  He was placed on cardiac telemetry asymptomatic status post hospitalized 02/20/2023. Reviewed recent test results with the patient, details below.  Patient denies chest pain, shortness of breath, palpitations, leg edema, orthopnea, PND, TIA/syncope.   Current Outpatient Medications:    allopurinol (ZYLOPRIM) 300 MG tablet, Take 300 mg by mouth daily. , Disp: , Rfl:    atorvastatin (LIPITOR) 20 MG tablet, Take 20 mg by mouth daily., Disp: , Rfl:    esomeprazole (NEXIUM) 20 MG capsule, Take 20 mg by mouth daily. , Disp: , Rfl:    Multiple Vitamin (MULTIVITAMIN WITH MINERALS) TABS tablet, Take 1 tablet by mouth daily. Men +, Disp: , Rfl:    potassium chloride SA (KLOR-CON M) 20 MEQ tablet, TAKE 1 TABLET EVERY DAY, Disp: 90 tablet, Rfl: 3   Torsemide 40 MG TABS, Take 40 mg by mouth 2 (two) times daily. (Patient taking differently: Take 40-80 mg by mouth See admin instructions. Taking 80 mg in the morning and 40 mg in the evening), Disp: 180 tablet, Rfl: 3  Cardiovascular & other pertient studies:  Mobile cardiac telemetry 7 days 12/28/2022 - 01/04/2023: Dominant rhythm: Sinus. HR 49-110 bpm. Avg HR 77 bpm, in sinus rhythm. >11,000 episodes of SVT/atrial tachycardia, fastest at 150 bpm for 32.5 secs, longest for 36.6 secs at 107 bpm. 3.3% isolated SVE, 2.9% couplets, 2.2% triplets. 1 episodes of VT, at 138 bpm for 4  beats. 1.8% isolated VE, <1% couplets. No definite evidence of Afib/Aflutter noted. 6 episodes of sinus pauses,longest at 3.3 secs, generally between midnight and 8 AM. No high grade AV block noted. 0 patient triggered events.   EKG 12/19/2022: Sinus rhythm 61 bpm with rate variation First degree A-V block  Cardioversion 08/08/2021: [id number] Successful  Echocardiogram 07/11/2021:  1. Left ventricular ejection fraction, by estimation, is 55 to 60%. The  left ventricle has normal function. The left ventricle has no regional  wall motion abnormalities. There is mild left ventricular hypertrophy.  Left ventricular diastolic parameters are indeterminate.   2. Right ventricular systolic function is normal. The right ventricular  size is normal. There is normal pulmonary artery systolic pressure.   3. The mitral valve is normal in structure. No evidence of mitral valve  regurgitation. No evidence of mitral stenosis.   4. The aortic valve is normal in structure. Aortic valve regurgitation is  not visualized. No aortic stenosis is present.   5. Aortic dilatation noted. There is mild dilatation of the ascending  aorta, measuring 42 mm.   6. The inferior vena cava is dilated in size with <50% respiratory  variability, suggesting right atrial pressure of 15 mmHg.   EKG 07/18/2021: Atrial flutter with 4:1 AV block Ventricular rate 68 bpm    Recent labs: 12/27/2022: Glucose 111, BUN/Cr 28/1.77. EGFR 38. Na/K 139/3.6.  H/H 11.6/36.2. MCV 100. Platelets 248  08/10/2022: Glucose 111, BUN/Cr 35/1.9. EGFR 35. Na/K 143/3.8.  Chol 142, TG 108, HDL  41, LDL 81  07/12/2021: Glucose 102, BUN/Cr 18/1.26. EGFR 58. Na/K 143/3.7.  BNP 208 H/H 12/38. MCV 96. Platelets 237 TSH 2.3 normal HbA1C N/A Lipid panel N/A    Review of Systems  Constitutional: Negative for fever.  Cardiovascular:  Positive for leg swelling (stable). Negative for chest pain, dyspnea on exertion, palpitations and syncope.   Respiratory:  Positive for snoring.   Neurological:  Positive for light-headedness (intermittent).         There were no vitals filed for this visit.   There is no height or weight on file to calculate BMI. There were no vitals filed for this visit.    Objective:   Physical Exam Vitals and nursing note reviewed.  Constitutional:      General: He is not in acute distress. Neck:     Vascular: No JVD.  Cardiovascular:     Rate and Rhythm: Normal rate and regular rhythm.     Heart sounds: Normal heart sounds. No murmur heard. Pulmonary:     Effort: Pulmonary effort is normal.     Breath sounds: Normal breath sounds. No wheezing or rales.  Musculoskeletal:     Right lower leg: Edema (1+) present.     Left lower leg: Edema (1+) present.        Assessment & Recommendations:   82 y.o. 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  *** Arrhythmia: Multiple arrhythmias noted over years.  He had paroxysmal atrial flutter during hospital admission with SIRS in 07/2021, asymptomatic sinus pauses-always during sleep hours physician in 12/2022 with spontaneous retroperitoneal hematoma, multiple episodes of SVT as well as sinus pauses during cardiac telemetry in 01/2023. There is no obvious reversible cardiopulmonary cause for this arrhythmia. I have referred him to EP to discuss further these rhythms conservatively, or consider therapy. With no recurrence of atrial flutter, and spontaneous retroperitoneal hematoma in 12/2022, he remains off anticoagulation.  *** Leg edema: Chronic leg edema without any other obvious signs of heart failure.   Continue torsemide. Continue follow-up with nephrology.   .  *** Hypertension: Controlled   F/u in 3 months   [employer], PA-C 04/24/2023, 9:32 AM Office: [phone]

## 2023-04-25 DIAGNOSIS — H353131 Nonexudative age-related macular degeneration, bilateral, early dry stage: Secondary | ICD-10-CM | POA: Diagnosis not present

## 2023-04-25 DIAGNOSIS — Z961 Presence of intraocular lens: Secondary | ICD-10-CM | POA: Diagnosis not present

## 2023-04-25 DIAGNOSIS — H26492 Other secondary cataract, left eye: Secondary | ICD-10-CM | POA: Diagnosis not present

## 2023-04-25 DIAGNOSIS — H26493 Other secondary cataract, bilateral: Secondary | ICD-10-CM | POA: Diagnosis not present

## 2023-04-25 DIAGNOSIS — H18413 Arcus senilis, bilateral: Secondary | ICD-10-CM | POA: Diagnosis not present

## 2023-04-26 DIAGNOSIS — R7303 Prediabetes: Secondary | ICD-10-CM | POA: Diagnosis not present

## 2023-04-26 DIAGNOSIS — N2581 Secondary hyperparathyroidism of renal origin: Secondary | ICD-10-CM | POA: Diagnosis not present

## 2023-04-26 DIAGNOSIS — N1832 Chronic kidney disease, stage 3b: Secondary | ICD-10-CM | POA: Diagnosis not present

## 2023-04-26 DIAGNOSIS — Z0000 Encounter for general adult medical examination without abnormal findings: Secondary | ICD-10-CM | POA: Diagnosis not present

## 2023-04-26 DIAGNOSIS — N200 Calculus of kidney: Secondary | ICD-10-CM | POA: Diagnosis not present

## 2023-04-26 DIAGNOSIS — I4892 Unspecified atrial flutter: Secondary | ICD-10-CM | POA: Diagnosis not present

## 2023-04-26 DIAGNOSIS — I130 Hypertensive heart and chronic kidney disease with heart failure and stage 1 through stage 4 chronic kidney disease, or unspecified chronic kidney disease: Secondary | ICD-10-CM | POA: Diagnosis not present

## 2023-04-26 DIAGNOSIS — I5030 Unspecified diastolic (congestive) heart failure: Secondary | ICD-10-CM | POA: Diagnosis not present

## 2023-04-26 DIAGNOSIS — I10 Essential (primary) hypertension: Secondary | ICD-10-CM | POA: Diagnosis not present

## 2023-04-26 DIAGNOSIS — Z8546 Personal history of malignant neoplasm of prostate: Secondary | ICD-10-CM | POA: Diagnosis not present

## 2023-04-26 DIAGNOSIS — I700 Atherosclerosis of aorta: Secondary | ICD-10-CM | POA: Diagnosis not present

## 2023-04-26 DIAGNOSIS — C61 Malignant neoplasm of prostate: Secondary | ICD-10-CM | POA: Diagnosis not present

## 2023-05-15 DIAGNOSIS — C61 Malignant neoplasm of prostate: Secondary | ICD-10-CM | POA: Diagnosis not present

## 2023-05-15 DIAGNOSIS — E291 Testicular hypofunction: Secondary | ICD-10-CM | POA: Diagnosis not present

## 2023-05-15 DIAGNOSIS — C7951 Secondary malignant neoplasm of bone: Secondary | ICD-10-CM | POA: Diagnosis not present

## 2023-05-16 DIAGNOSIS — H26491 Other secondary cataract, right eye: Secondary | ICD-10-CM | POA: Diagnosis not present

## 2023-05-29 ENCOUNTER — Other Ambulatory Visit (HOSPITAL_COMMUNITY): Payer: Self-pay | Admitting: Urology

## 2023-05-29 DIAGNOSIS — C61 Malignant neoplasm of prostate: Secondary | ICD-10-CM

## 2023-06-18 ENCOUNTER — Ambulatory Visit (HOSPITAL_COMMUNITY)
Admission: RE | Admit: 2023-06-18 | Discharge: 2023-06-18 | Disposition: A | Discharge status: Home / Self Care | Payer: Medicare PPO | Source: Ambulatory Visit | Attending: Urology | Admitting: Urology

## 2023-06-18 DIAGNOSIS — C61 Malignant neoplasm of prostate: Secondary | ICD-10-CM | POA: Diagnosis not present

## 2023-06-18 MED ORDER — PIFLIFOLASTAT F 18 (PYLARIFY) INJECTION
9.0000 | Freq: Once | INTRAVENOUS | Status: AC
  Administered 2023-06-18: 8.77 via INTRAVENOUS

## 2023-06-19 ENCOUNTER — Ambulatory Visit: Payer: Medicare PPO | Admitting: Cardiology

## 2023-06-21 ENCOUNTER — Ambulatory Visit: Payer: Medicare PPO | Admitting: Cardiology

## 2023-06-21 ENCOUNTER — Encounter: Payer: Self-pay | Admitting: Cardiology

## 2023-06-21 VITALS — BP 145/83 | HR 71 | Resp 16 | Ht 72.0 in | Wt 233.0 lb

## 2023-06-21 DIAGNOSIS — I455 Other specified heart block: Secondary | ICD-10-CM | POA: Insufficient documentation

## 2023-06-21 DIAGNOSIS — I483 Typical atrial flutter: Secondary | ICD-10-CM

## 2023-06-21 DIAGNOSIS — I471 Supraventricular tachycardia, unspecified: Secondary | ICD-10-CM

## 2023-06-21 NOTE — Progress Notes (Signed)
Follow up visit  Subjective:   Matthew [name]., male    DOB: Nov 15, 1941, 82 y.o.   MRN: [id number]    HPI  Chief Complaint  Patient presents with   SVT (supraventricular tachycardia)   Chronic heart failure with preserved ejection fraction    Atrial Flutter   Follow-up    6 months    82 y.o.  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  Patient is doing well from cardiac standpoint, denies chest pain, shortness of breath, palpitations, orthopnea, PND, TIA/syncope. Leg edema has significantly improved. He has f/u w/urology soon due to rising PSA.    Current Outpatient Medications:    allopurinol (ZYLOPRIM) 300 MG tablet, Take 300 mg by mouth daily. , Disp: , Rfl:    atorvastatin (LIPITOR) 20 MG tablet, Take 20 mg by mouth daily., Disp: , Rfl:    esomeprazole (NEXIUM) 20 MG capsule, Take 20 mg by mouth daily. , Disp: , Rfl:    Multiple Vitamin (MULTIVITAMIN WITH MINERALS) TABS tablet, Take 1 tablet by mouth daily. Men +, Disp: , Rfl:    potassium chloride SA (KLOR-CON M) 20 MEQ tablet, TAKE 1 TABLET EVERY DAY, Disp: 90 tablet, Rfl: 3   Torsemide 40 MG TABS, Take 40 mg by mouth 2 (two) times daily. (Patient taking differently: Take 40-80 mg by mouth See admin instructions. Taking 80 mg in the morning and 40 mg in the evening), Disp: 180 tablet, Rfl: 3  Cardiovascular & other pertient studies:  EKG 06/21/2023: Sinus rhythm 72 bpm First degree AV block Frequent PACs  Mobile cardiac telemetry 7 days 12/28/2022 - 01/04/2023: Dominant rhythm: Sinus. HR 49-110 bpm. Avg HR 77 bpm, in sinus rhythm. >11,000 episodes of SVT/atrial tachycardia, fastest at 150 bpm for 32.5 secs, longest for 36.6 secs at 107  bpm. 3.3% isolated SVE, 2.9% couplets, 2.2% triplets. 1 episodes of VT, at 138 bpm for 4 beats. 1.8% isolated VE, <1% couplets. No definite evidence of Afib/Aflutter noted. 6 episodes of sinus pauses,longest at 3.3 secs, generally between midnight and 8 AM. No high grade AV block noted. 0 patient triggered events.   EKG 12/19/2022: Sinus rhythm 61 bpm with rate variation First degree A-V block  Cardioversion 08/08/2021: [id number] Successful  Echocardiogram 07/11/2021:  1. Left ventricular ejection fraction, by estimation, is 55 to 60%. The  left ventricle has normal function. The left ventricle has no regional  wall motion abnormalities. There is mild left ventricular hypertrophy.  Left ventricular diastolic parameters are indeterminate.   2. Right ventricular systolic function is normal. The right ventricular  size is normal. There is normal pulmonary artery systolic pressure.   3. The mitral valve is normal in structure. No evidence of mitral valve  regurgitation. No evidence of mitral stenosis.   4. The aortic valve is normal in structure. Aortic valve regurgitation is  not visualized. No aortic stenosis is present.   5. Aortic dilatation noted. There is mild dilatation of the ascending  aorta, measuring 42 mm.   6. The inferior vena cava is dilated in size with <50% respiratory  variability, suggesting right atrial pressure of 15 mmHg.   EKG 07/18/2021: Atrial flutter with 4:1 AV block Ventricular rate 68 bpm    Recent labs: 12/27/2022: Glucose 111, BUN/Cr 28/1.77. EGFR 38. Na/K  139/3.6.  H/H 11.6/36.2. MCV 100. Platelets 248  08/10/2022: Glucose 111, BUN/Cr 35/1.9. EGFR 35. Na/K 143/3.8.  Chol 142, TG 108, HDL 41, LDL 81  07/12/2021: Glucose 102, BUN/Cr 18/1.26. EGFR 58. Na/K 143/3.7.  BNP 208 H/H 12/38. MCV 96. Platelets 237 TSH 2.3 normal HbA1C N/A Lipid panel N/A    Review of Systems  Constitutional: Negative for fever.  Cardiovascular:  Positive for leg swelling  (stable). Negative for chest pain, dyspnea on exertion, palpitations and syncope.  Respiratory:  Positive for snoring.   Neurological:  Positive for light-headedness (intermittent).         Vitals:   06/21/23 1427  BP: (!) 145/83  Pulse: 71  Resp: 16  SpO2: 97%     Body mass index is 31.6 kg/m. Filed Weights   06/21/23 1427  Weight: 233 lb (105.7 kg)      Objective:   Physical Exam Vitals and nursing note reviewed.  Constitutional:      General: He is not in acute distress. Neck:     Vascular: No JVD.  Cardiovascular:     Rate and Rhythm: Normal rate and regular rhythm.     Heart sounds: Normal heart sounds. No murmur heard. Pulmonary:     Effort: Pulmonary effort is normal.     Breath sounds: Normal breath sounds. No wheezing or rales.  Musculoskeletal:     Right lower leg: Edema (Trace) present.     Left lower leg: Edema (Trace) present.         Assessment & Recommendations:   82 y.o. 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  Arrhythmia: Multiple arrhythmias noted over years including atrial flutter, PAC, PSVT, sinus pause. Seen by EP. In absence of symptoms, watchful monitoring recommended. With no recurrence of atrial flutter, and spontaneous retroperitoneal hematoma in 12/2022, he remains off anticoagulation.  Leg edema: Chronic leg edema without any other obvious signs of heart failure.   Continue torsemide and compression stockings. Controlled.  Continue follow-up with nephrology.  .  Hypertension: Controlled   F/u in 1 year    [employer], PA-C 06/21/2023, 11:42 AM Office: [phone]

## 2023-09-09 ENCOUNTER — Other Ambulatory Visit: Payer: Self-pay | Admitting: Cardiology

## 2023-09-09 DIAGNOSIS — I5032 Chronic diastolic (congestive) heart failure: Secondary | ICD-10-CM

## 2023-09-18 DIAGNOSIS — N2581 Secondary hyperparathyroidism of renal origin: Secondary | ICD-10-CM | POA: Diagnosis not present

## 2023-09-18 DIAGNOSIS — C7982 Secondary malignant neoplasm of genital organs: Secondary | ICD-10-CM | POA: Diagnosis not present

## 2023-09-18 DIAGNOSIS — I10 Essential (primary) hypertension: Secondary | ICD-10-CM | POA: Diagnosis not present

## 2023-09-18 DIAGNOSIS — I700 Atherosclerosis of aorta: Secondary | ICD-10-CM | POA: Diagnosis not present

## 2023-09-18 DIAGNOSIS — R7303 Prediabetes: Secondary | ICD-10-CM | POA: Diagnosis not present

## 2023-09-18 DIAGNOSIS — I5030 Unspecified diastolic (congestive) heart failure: Secondary | ICD-10-CM | POA: Diagnosis not present

## 2023-09-18 DIAGNOSIS — N1831 Chronic kidney disease, stage 3a: Secondary | ICD-10-CM | POA: Diagnosis not present

## 2023-09-18 DIAGNOSIS — C61 Malignant neoplasm of prostate: Secondary | ICD-10-CM | POA: Diagnosis not present

## 2023-09-18 DIAGNOSIS — Z23 Encounter for immunization: Secondary | ICD-10-CM | POA: Diagnosis not present

## 2023-09-27 ENCOUNTER — Other Ambulatory Visit: Payer: Self-pay | Admitting: Cardiology

## 2023-09-27 DIAGNOSIS — I5032 Chronic diastolic (congestive) heart failure: Secondary | ICD-10-CM

## 2023-10-01 DIAGNOSIS — C61 Malignant neoplasm of prostate: Secondary | ICD-10-CM | POA: Diagnosis not present

## 2023-10-09 DIAGNOSIS — C7951 Secondary malignant neoplasm of bone: Secondary | ICD-10-CM | POA: Diagnosis not present

## 2023-10-09 DIAGNOSIS — N393 Stress incontinence (female) (male): Secondary | ICD-10-CM | POA: Diagnosis not present

## 2023-10-09 DIAGNOSIS — C61 Malignant neoplasm of prostate: Secondary | ICD-10-CM | POA: Diagnosis not present

## 2023-10-09 DIAGNOSIS — N3020 Other chronic cystitis without hematuria: Secondary | ICD-10-CM | POA: Diagnosis not present

## 2024-03-23 DIAGNOSIS — R310 Gross hematuria: Secondary | ICD-10-CM | POA: Diagnosis not present

## 2024-03-23 DIAGNOSIS — C61 Malignant neoplasm of prostate: Secondary | ICD-10-CM | POA: Diagnosis not present

## 2024-03-23 DIAGNOSIS — N3020 Other chronic cystitis without hematuria: Secondary | ICD-10-CM | POA: Diagnosis not present

## 2024-04-03 DIAGNOSIS — C61 Malignant neoplasm of prostate: Secondary | ICD-10-CM | POA: Diagnosis not present

## 2024-04-03 DIAGNOSIS — R310 Gross hematuria: Secondary | ICD-10-CM | POA: Diagnosis not present

## 2024-04-03 DIAGNOSIS — Q613 Polycystic kidney, unspecified: Secondary | ICD-10-CM | POA: Diagnosis not present

## 2024-04-03 DIAGNOSIS — C7951 Secondary malignant neoplasm of bone: Secondary | ICD-10-CM | POA: Diagnosis not present

## 2024-04-03 DIAGNOSIS — N3090 Cystitis, unspecified without hematuria: Secondary | ICD-10-CM | POA: Diagnosis not present

## 2024-04-08 DIAGNOSIS — R310 Gross hematuria: Secondary | ICD-10-CM | POA: Diagnosis not present

## 2024-04-08 DIAGNOSIS — C7951 Secondary malignant neoplasm of bone: Secondary | ICD-10-CM | POA: Diagnosis not present

## 2024-04-08 DIAGNOSIS — N130 Hydronephrosis with ureteropelvic junction obstruction: Secondary | ICD-10-CM | POA: Diagnosis not present

## 2024-04-08 DIAGNOSIS — C61 Malignant neoplasm of prostate: Secondary | ICD-10-CM | POA: Diagnosis not present

## 2024-04-08 DIAGNOSIS — N200 Calculus of kidney: Secondary | ICD-10-CM | POA: Diagnosis not present

## 2024-04-13 DIAGNOSIS — C61 Malignant neoplasm of prostate: Secondary | ICD-10-CM | POA: Diagnosis not present

## 2024-04-13 DIAGNOSIS — R310 Gross hematuria: Secondary | ICD-10-CM | POA: Diagnosis not present

## 2024-04-15 ENCOUNTER — Other Ambulatory Visit (HOSPITAL_COMMUNITY): Payer: Self-pay | Admitting: Urology

## 2024-04-15 DIAGNOSIS — C61 Malignant neoplasm of prostate: Secondary | ICD-10-CM

## 2024-04-22 ENCOUNTER — Ambulatory Visit (HOSPITAL_COMMUNITY)
Admission: RE | Admit: 2024-04-22 | Discharge: 2024-04-22 | Disposition: A | Discharge status: Home / Self Care | Source: Ambulatory Visit | Attending: Urology | Admitting: Urology

## 2024-04-22 DIAGNOSIS — C61 Malignant neoplasm of prostate: Secondary | ICD-10-CM | POA: Insufficient documentation

## 2024-04-22 MED ORDER — FLOTUFOLASTAT F 18 GALLIUM 296-5846 MBQ/ML IV SOLN
8.2000 | Freq: Once | INTRAVENOUS | Status: AC
  Administered 2024-04-22: 8.2 via INTRAVENOUS

## 2024-04-23 NOTE — Progress Notes (Signed)
 STAT read request placed for PSMA PET.

## 2024-05-08 DIAGNOSIS — C61 Malignant neoplasm of prostate: Secondary | ICD-10-CM | POA: Diagnosis not present

## 2024-05-08 DIAGNOSIS — E291 Testicular hypofunction: Secondary | ICD-10-CM | POA: Diagnosis not present

## 2024-05-08 DIAGNOSIS — C7951 Secondary malignant neoplasm of bone: Secondary | ICD-10-CM | POA: Diagnosis not present

## 2024-05-08 DIAGNOSIS — C7982 Secondary malignant neoplasm of genital organs: Secondary | ICD-10-CM | POA: Diagnosis not present

## 2024-06-03 DIAGNOSIS — C61 Malignant neoplasm of prostate: Secondary | ICD-10-CM | POA: Diagnosis not present

## 2024-06-11 DIAGNOSIS — N130 Hydronephrosis with ureteropelvic junction obstruction: Secondary | ICD-10-CM | POA: Diagnosis not present

## 2024-06-11 DIAGNOSIS — C7951 Secondary malignant neoplasm of bone: Secondary | ICD-10-CM | POA: Diagnosis not present

## 2024-06-11 DIAGNOSIS — C61 Malignant neoplasm of prostate: Secondary | ICD-10-CM | POA: Diagnosis not present

## 2024-06-11 DIAGNOSIS — C7982 Secondary malignant neoplasm of genital organs: Secondary | ICD-10-CM | POA: Diagnosis not present

## 2024-06-19 ENCOUNTER — Ambulatory Visit: Payer: Self-pay | Admitting: Cardiology

## 2024-06-25 ENCOUNTER — Other Ambulatory Visit: Payer: Self-pay | Admitting: Cardiology

## 2024-06-25 DIAGNOSIS — I5032 Chronic diastolic (congestive) heart failure: Secondary | ICD-10-CM

## 2024-06-30 DIAGNOSIS — C61 Malignant neoplasm of prostate: Secondary | ICD-10-CM | POA: Diagnosis not present

## 2024-07-02 ENCOUNTER — Other Ambulatory Visit: Payer: Self-pay

## 2024-07-02 DIAGNOSIS — I5032 Chronic diastolic (congestive) heart failure: Secondary | ICD-10-CM

## 2024-07-02 MED ORDER — TORSEMIDE 20 MG PO TABS: 40.0000 mg | ORAL_TABLET | Freq: Two times a day (BID) | ORAL | 0 refills | Status: DC

## 2024-07-23 ENCOUNTER — Other Ambulatory Visit: Payer: Self-pay | Admitting: Cardiology

## 2024-07-23 DIAGNOSIS — I5032 Chronic diastolic (congestive) heart failure: Secondary | ICD-10-CM

## 2024-09-09 ENCOUNTER — Other Ambulatory Visit: Payer: Self-pay | Admitting: Cardiology

## 2024-09-09 DIAGNOSIS — I5032 Chronic diastolic (congestive) heart failure: Secondary | ICD-10-CM

## 2024-09-24 DIAGNOSIS — C61 Malignant neoplasm of prostate: Secondary | ICD-10-CM | POA: Diagnosis not present

## 2024-10-01 DIAGNOSIS — C61 Malignant neoplasm of prostate: Secondary | ICD-10-CM | POA: Diagnosis not present

## 2024-10-01 DIAGNOSIS — C7951 Secondary malignant neoplasm of bone: Secondary | ICD-10-CM | POA: Diagnosis not present

## 2024-10-20 ENCOUNTER — Other Ambulatory Visit: Payer: Self-pay | Admitting: Cardiology

## 2024-10-20 DIAGNOSIS — I5032 Chronic diastolic (congestive) heart failure: Secondary | ICD-10-CM

## 2024-11-04 ENCOUNTER — Other Ambulatory Visit: Payer: Self-pay | Admitting: Cardiology

## 2024-11-04 DIAGNOSIS — I5032 Chronic diastolic (congestive) heart failure: Secondary | ICD-10-CM
